# Patient Record
Sex: Female | Born: 1955 | Race: White | Hispanic: No
Health system: Southern US, Community
[De-identification: ages and names within clinical notes are randomized; demographics above are authoritative.]

## PROBLEM LIST (undated history)

## (undated) DIAGNOSIS — E669 Obesity, unspecified: Secondary | ICD-10-CM

## (undated) DIAGNOSIS — E785 Hyperlipidemia, unspecified: Secondary | ICD-10-CM

## (undated) DIAGNOSIS — C541 Malignant neoplasm of endometrium: Secondary | ICD-10-CM

## (undated) DIAGNOSIS — K529 Noninfective gastroenteritis and colitis, unspecified: Secondary | ICD-10-CM

## (undated) DIAGNOSIS — K449 Diaphragmatic hernia without obstruction or gangrene: Secondary | ICD-10-CM

## (undated) DIAGNOSIS — K119 Disease of salivary gland, unspecified: Secondary | ICD-10-CM

## (undated) DIAGNOSIS — G0490 Encephalitis and encephalomyelitis, unspecified: Secondary | ICD-10-CM

## (undated) DIAGNOSIS — C4490 Unspecified malignant neoplasm of skin, unspecified: Secondary | ICD-10-CM

## (undated) DIAGNOSIS — K635 Polyp of colon: Secondary | ICD-10-CM

## (undated) DIAGNOSIS — R002 Palpitations: Secondary | ICD-10-CM

## (undated) DIAGNOSIS — IMO0001 Reserved for inherently not codable concepts without codable children: Secondary | ICD-10-CM

## (undated) DIAGNOSIS — R918 Other nonspecific abnormal finding of lung field: Secondary | ICD-10-CM

## (undated) DIAGNOSIS — B250 Cytomegaloviral pneumonitis: Secondary | ICD-10-CM

## (undated) DIAGNOSIS — J45909 Unspecified asthma, uncomplicated: Secondary | ICD-10-CM

## (undated) DIAGNOSIS — E041 Nontoxic single thyroid nodule: Secondary | ICD-10-CM

## (undated) DIAGNOSIS — D689 Coagulation defect, unspecified: Secondary | ICD-10-CM

## (undated) DIAGNOSIS — H8110 Benign paroxysmal vertigo, unspecified ear: Secondary | ICD-10-CM

## (undated) DIAGNOSIS — I809 Phlebitis and thrombophlebitis of unspecified site: Secondary | ICD-10-CM

## (undated) DIAGNOSIS — K5790 Diverticulosis of intestine, part unspecified, without perforation or abscess without bleeding: Secondary | ICD-10-CM

## (undated) DIAGNOSIS — R609 Edema, unspecified: Secondary | ICD-10-CM

## (undated) HISTORY — DX: Palpitations: R00.2

## (undated) HISTORY — DX: Diverticulosis of intestine, part unspecified, without perforation or abscess without bleeding: K57.90

## (undated) HISTORY — DX: Encephalitis and encephalomyelitis, unspecified: G04.90

## (undated) HISTORY — DX: Obesity, unspecified: E66.9

## (undated) HISTORY — PX: TONSILLECTOMY: SUR1361

## (undated) HISTORY — DX: Unspecified malignant neoplasm of skin, unspecified: C44.90

## (undated) HISTORY — PX: LUNG LOBECTOMY: SHX167

## (undated) HISTORY — DX: Polyp of colon: K63.5

## (undated) HISTORY — PX: LAPAROSCOPY: SHX197

## (undated) HISTORY — DX: Malignant neoplasm of endometrium: C54.1

## (undated) HISTORY — PX: CHOLECYSTECTOMY: SHX55

## (undated) HISTORY — DX: Benign paroxysmal vertigo, unspecified ear: H81.10

## (undated) HISTORY — PX: OTHER SURGICAL HISTORY: SHX169

## (undated) HISTORY — DX: Noninfective gastroenteritis and colitis, unspecified: K52.9

## (undated) HISTORY — DX: Disease of salivary gland, unspecified: K11.9

## (undated) HISTORY — DX: Coagulation defect, unspecified: D68.9

## (undated) HISTORY — DX: Hyperlipidemia, unspecified: E78.5

## (undated) HISTORY — DX: Diaphragmatic hernia without obstruction or gangrene: K44.9

---

## 1997-03-02 ENCOUNTER — Other Ambulatory Visit: Admission: RE | Admit: 1997-03-02 | Discharge: 1997-03-02 | Payer: Self-pay | Admitting: Gynecology

## 1998-03-10 ENCOUNTER — Other Ambulatory Visit: Admission: RE | Admit: 1998-03-10 | Discharge: 1998-03-10 | Payer: Self-pay | Admitting: Gynecology

## 1998-11-30 ENCOUNTER — Encounter: Admission: RE | Admit: 1998-11-30 | Discharge: 1998-11-30 | Payer: Self-pay | Admitting: Internal Medicine

## 1998-11-30 ENCOUNTER — Encounter: Payer: Self-pay | Admitting: Internal Medicine

## 1998-12-13 ENCOUNTER — Other Ambulatory Visit: Admission: RE | Admit: 1998-12-13 | Discharge: 1998-12-13 | Payer: Self-pay | Admitting: Gynecology

## 1999-04-21 ENCOUNTER — Encounter: Payer: Self-pay | Admitting: Orthopedic Surgery

## 1999-04-21 ENCOUNTER — Encounter: Admission: RE | Admit: 1999-04-21 | Discharge: 1999-04-21 | Payer: Self-pay | Admitting: Orthopedic Surgery

## 1999-06-29 ENCOUNTER — Other Ambulatory Visit: Admission: RE | Admit: 1999-06-29 | Discharge: 1999-06-29 | Payer: Self-pay | Admitting: Gynecology

## 1999-12-02 ENCOUNTER — Ambulatory Visit: Admission: RE | Admit: 1999-12-02 | Discharge: 1999-12-02 | Payer: Self-pay | Admitting: Endocrinology

## 1999-12-02 ENCOUNTER — Encounter: Payer: Self-pay | Admitting: Endocrinology

## 2000-10-08 ENCOUNTER — Other Ambulatory Visit: Admission: RE | Admit: 2000-10-08 | Discharge: 2000-10-08 | Payer: Self-pay | Admitting: Gynecology

## 2000-10-10 ENCOUNTER — Encounter: Payer: Self-pay | Admitting: Internal Medicine

## 2000-10-10 ENCOUNTER — Encounter: Admission: RE | Admit: 2000-10-10 | Discharge: 2000-10-10 | Payer: Self-pay | Admitting: Internal Medicine

## 2003-08-11 ENCOUNTER — Encounter: Admission: RE | Admit: 2003-08-11 | Discharge: 2003-08-11 | Payer: Self-pay | Admitting: Gynecology

## 2003-09-01 ENCOUNTER — Other Ambulatory Visit: Admission: RE | Admit: 2003-09-01 | Discharge: 2003-09-01 | Payer: Self-pay | Admitting: Gynecology

## 2003-12-29 ENCOUNTER — Encounter (INDEPENDENT_AMBULATORY_CARE_PROVIDER_SITE_OTHER): Payer: Self-pay | Admitting: *Deleted

## 2003-12-29 ENCOUNTER — Ambulatory Visit: Admission: RE | Admit: 2003-12-29 | Discharge: 2003-12-29 | Payer: Self-pay | Admitting: Gynecology

## 2003-12-30 ENCOUNTER — Ambulatory Visit: Payer: Self-pay | Admitting: Internal Medicine

## 2004-01-07 ENCOUNTER — Ambulatory Visit: Payer: Self-pay | Admitting: Internal Medicine

## 2004-01-08 ENCOUNTER — Ambulatory Visit: Payer: Self-pay | Admitting: Internal Medicine

## 2004-01-10 DIAGNOSIS — C541 Malignant neoplasm of endometrium: Secondary | ICD-10-CM

## 2004-01-10 HISTORY — PX: TOTAL ABDOMINAL HYSTERECTOMY W/ BILATERAL SALPINGOOPHORECTOMY: SHX83

## 2004-01-10 HISTORY — DX: Malignant neoplasm of endometrium: C54.1

## 2004-01-14 ENCOUNTER — Ambulatory Visit: Payer: Self-pay | Admitting: Internal Medicine

## 2004-01-26 ENCOUNTER — Ambulatory Visit: Payer: Self-pay | Admitting: Internal Medicine

## 2004-01-26 HISTORY — PX: COLONOSCOPY W/ POLYPECTOMY: SHX1380

## 2004-02-09 ENCOUNTER — Encounter (INDEPENDENT_AMBULATORY_CARE_PROVIDER_SITE_OTHER): Payer: Self-pay | Admitting: *Deleted

## 2004-02-09 ENCOUNTER — Inpatient Hospital Stay (HOSPITAL_COMMUNITY): Admission: RE | Admit: 2004-02-09 | Discharge: 2004-02-10 | Payer: Self-pay | Admitting: Gynecology

## 2004-03-29 ENCOUNTER — Ambulatory Visit: Admission: RE | Admit: 2004-03-29 | Discharge: 2004-03-29 | Payer: Self-pay | Admitting: Gynecology

## 2004-06-10 ENCOUNTER — Ambulatory Visit: Payer: Self-pay | Admitting: Internal Medicine

## 2004-08-09 DIAGNOSIS — K529 Noninfective gastroenteritis and colitis, unspecified: Secondary | ICD-10-CM

## 2004-08-09 HISTORY — DX: Noninfective gastroenteritis and colitis, unspecified: K52.9

## 2004-08-24 ENCOUNTER — Ambulatory Visit: Payer: Self-pay | Admitting: Internal Medicine

## 2004-08-24 ENCOUNTER — Inpatient Hospital Stay (HOSPITAL_COMMUNITY): Admission: AD | Admit: 2004-08-24 | Discharge: 2004-08-27 | Payer: Self-pay | Admitting: Internal Medicine

## 2004-08-25 ENCOUNTER — Ambulatory Visit: Payer: Self-pay | Admitting: Internal Medicine

## 2004-08-26 ENCOUNTER — Encounter (INDEPENDENT_AMBULATORY_CARE_PROVIDER_SITE_OTHER): Payer: Self-pay | Admitting: Specialist

## 2004-08-26 ENCOUNTER — Ambulatory Visit: Payer: Self-pay | Admitting: Internal Medicine

## 2004-08-26 ENCOUNTER — Encounter: Payer: Self-pay | Admitting: Gastroenterology

## 2004-08-26 ENCOUNTER — Encounter (INDEPENDENT_AMBULATORY_CARE_PROVIDER_SITE_OTHER): Payer: Self-pay | Admitting: *Deleted

## 2004-08-27 ENCOUNTER — Encounter (INDEPENDENT_AMBULATORY_CARE_PROVIDER_SITE_OTHER): Payer: Self-pay | Admitting: *Deleted

## 2004-09-05 ENCOUNTER — Ambulatory Visit: Payer: Self-pay | Admitting: Internal Medicine

## 2004-09-07 ENCOUNTER — Ambulatory Visit: Payer: Self-pay | Admitting: Hematology & Oncology

## 2004-09-13 ENCOUNTER — Ambulatory Visit: Payer: Self-pay | Admitting: Internal Medicine

## 2004-10-28 ENCOUNTER — Ambulatory Visit: Payer: Self-pay | Admitting: Internal Medicine

## 2004-11-16 ENCOUNTER — Ambulatory Visit: Payer: Self-pay | Admitting: Hematology & Oncology

## 2005-01-17 ENCOUNTER — Ambulatory Visit: Payer: Self-pay | Admitting: Internal Medicine

## 2005-02-02 ENCOUNTER — Ambulatory Visit (HOSPITAL_COMMUNITY): Admission: RE | Admit: 2005-02-02 | Discharge: 2005-02-02 | Payer: Self-pay | Admitting: Internal Medicine

## 2005-02-02 ENCOUNTER — Encounter (INDEPENDENT_AMBULATORY_CARE_PROVIDER_SITE_OTHER): Payer: Self-pay | Admitting: *Deleted

## 2005-02-17 ENCOUNTER — Ambulatory Visit: Admission: RE | Admit: 2005-02-17 | Discharge: 2005-02-17 | Payer: Self-pay | Admitting: Gynecology

## 2005-02-17 ENCOUNTER — Encounter (INDEPENDENT_AMBULATORY_CARE_PROVIDER_SITE_OTHER): Payer: Self-pay | Admitting: Specialist

## 2005-02-17 ENCOUNTER — Other Ambulatory Visit: Admission: RE | Admit: 2005-02-17 | Discharge: 2005-02-17 | Payer: Self-pay | Admitting: Gynecology

## 2005-04-15 ENCOUNTER — Ambulatory Visit: Payer: Self-pay | Admitting: Family Medicine

## 2005-05-15 ENCOUNTER — Ambulatory Visit: Payer: Self-pay | Admitting: Internal Medicine

## 2005-06-29 ENCOUNTER — Ambulatory Visit: Payer: Self-pay | Admitting: Family Medicine

## 2005-09-15 ENCOUNTER — Ambulatory Visit: Payer: Self-pay | Admitting: Internal Medicine

## 2005-09-22 ENCOUNTER — Encounter (INDEPENDENT_AMBULATORY_CARE_PROVIDER_SITE_OTHER): Payer: Self-pay | Admitting: *Deleted

## 2005-09-22 ENCOUNTER — Other Ambulatory Visit: Admission: RE | Admit: 2005-09-22 | Discharge: 2005-09-22 | Payer: Self-pay | Admitting: Gynecology

## 2005-09-22 ENCOUNTER — Ambulatory Visit: Admission: RE | Admit: 2005-09-22 | Discharge: 2005-09-22 | Payer: Self-pay | Admitting: Gynecology

## 2005-12-18 ENCOUNTER — Ambulatory Visit: Payer: Self-pay | Admitting: Internal Medicine

## 2006-02-27 ENCOUNTER — Ambulatory Visit: Admission: RE | Admit: 2006-02-27 | Discharge: 2006-02-27 | Payer: Self-pay | Admitting: Gynecology

## 2006-02-27 ENCOUNTER — Encounter (INDEPENDENT_AMBULATORY_CARE_PROVIDER_SITE_OTHER): Payer: Self-pay | Admitting: Specialist

## 2006-02-27 ENCOUNTER — Other Ambulatory Visit: Admission: RE | Admit: 2006-02-27 | Discharge: 2006-02-27 | Payer: Self-pay | Admitting: Gynecology

## 2006-03-08 ENCOUNTER — Ambulatory Visit: Payer: Self-pay | Admitting: Internal Medicine

## 2006-03-22 ENCOUNTER — Encounter: Admission: RE | Admit: 2006-03-22 | Discharge: 2006-03-22 | Payer: Self-pay | Admitting: Internal Medicine

## 2006-03-27 ENCOUNTER — Ambulatory Visit: Payer: Self-pay | Admitting: Internal Medicine

## 2006-04-03 ENCOUNTER — Ambulatory Visit: Payer: Self-pay | Admitting: Internal Medicine

## 2006-04-10 ENCOUNTER — Encounter: Admission: RE | Admit: 2006-04-10 | Discharge: 2006-04-10 | Payer: Self-pay | Admitting: Internal Medicine

## 2006-07-03 ENCOUNTER — Ambulatory Visit: Payer: Self-pay | Admitting: Internal Medicine

## 2006-07-03 LAB — CONVERTED CEMR LAB
Calcium: 9.2 mg/dL (ref 8.4–10.5)
Chloride: 105 meq/L (ref 96–112)
GFR calc non Af Amer: 62 mL/min
Glucose, Bld: 87 mg/dL (ref 70–99)
HCT: 41.3 % (ref 36.0–46.0)
Hemoglobin: 13.9 g/dL (ref 12.0–15.0)
MCHC: 33.6 g/dL (ref 30.0–36.0)
Magnesium: 2.3 mg/dL (ref 1.5–2.5)
Monocytes Absolute: 0.4 10*3/uL (ref 0.2–0.7)
Monocytes Relative: 6.6 % (ref 3.0–11.0)
Neutrophils Relative %: 67.5 % (ref 43.0–77.0)
Platelets: 226 10*3/uL (ref 150–400)
T3, Free: 2.8 pg/mL (ref 2.3–4.2)
TSH: 3.13 microintl units/mL (ref 0.35–5.50)

## 2006-07-04 ENCOUNTER — Encounter (INDEPENDENT_AMBULATORY_CARE_PROVIDER_SITE_OTHER): Payer: Self-pay | Admitting: *Deleted

## 2006-11-01 ENCOUNTER — Ambulatory Visit: Payer: Self-pay | Admitting: Family Medicine

## 2007-02-23 ENCOUNTER — Ambulatory Visit: Payer: Self-pay | Admitting: Family Medicine

## 2007-02-23 DIAGNOSIS — J3089 Other allergic rhinitis: Secondary | ICD-10-CM | POA: Insufficient documentation

## 2007-06-22 ENCOUNTER — Ambulatory Visit: Payer: Self-pay | Admitting: Family Medicine

## 2007-06-22 DIAGNOSIS — K119 Disease of salivary gland, unspecified: Secondary | ICD-10-CM | POA: Insufficient documentation

## 2007-06-22 DIAGNOSIS — J309 Allergic rhinitis, unspecified: Secondary | ICD-10-CM | POA: Insufficient documentation

## 2007-06-22 DIAGNOSIS — E785 Hyperlipidemia, unspecified: Secondary | ICD-10-CM

## 2007-06-22 HISTORY — DX: Hyperlipidemia, unspecified: E78.5

## 2007-06-22 HISTORY — DX: Disease of salivary gland, unspecified: K11.9

## 2008-02-03 ENCOUNTER — Emergency Department (HOSPITAL_COMMUNITY): Admission: EM | Admit: 2008-02-03 | Discharge: 2008-02-04 | Payer: Self-pay | Admitting: Emergency Medicine

## 2008-02-04 ENCOUNTER — Encounter: Payer: Self-pay | Admitting: Internal Medicine

## 2008-02-05 ENCOUNTER — Encounter: Admission: RE | Admit: 2008-02-05 | Discharge: 2008-02-05 | Payer: Self-pay | Admitting: Orthopedic Surgery

## 2008-06-05 ENCOUNTER — Other Ambulatory Visit: Admission: RE | Admit: 2008-06-05 | Discharge: 2008-06-05 | Payer: Self-pay | Admitting: Gynecology

## 2008-06-05 ENCOUNTER — Ambulatory Visit: Admission: RE | Admit: 2008-06-05 | Discharge: 2008-06-05 | Payer: Self-pay | Admitting: Gynecology

## 2008-07-28 ENCOUNTER — Ambulatory Visit: Payer: Self-pay | Admitting: Internal Medicine

## 2008-07-30 ENCOUNTER — Ambulatory Visit: Payer: Self-pay | Admitting: Internal Medicine

## 2008-09-16 ENCOUNTER — Ambulatory Visit: Payer: Self-pay | Admitting: Internal Medicine

## 2008-09-16 DIAGNOSIS — N959 Unspecified menopausal and perimenopausal disorder: Secondary | ICD-10-CM | POA: Insufficient documentation

## 2008-09-21 ENCOUNTER — Ambulatory Visit: Payer: Self-pay | Admitting: Internal Medicine

## 2008-09-21 DIAGNOSIS — R609 Edema, unspecified: Secondary | ICD-10-CM | POA: Insufficient documentation

## 2008-09-28 ENCOUNTER — Encounter: Payer: Self-pay | Admitting: Internal Medicine

## 2008-09-28 ENCOUNTER — Encounter: Admission: RE | Admit: 2008-09-28 | Discharge: 2008-09-28 | Payer: Self-pay | Admitting: Internal Medicine

## 2008-09-30 ENCOUNTER — Encounter: Admission: RE | Admit: 2008-09-30 | Discharge: 2008-09-30 | Payer: Self-pay | Admitting: Internal Medicine

## 2008-10-02 ENCOUNTER — Encounter (INDEPENDENT_AMBULATORY_CARE_PROVIDER_SITE_OTHER): Payer: Self-pay | Admitting: *Deleted

## 2008-10-29 ENCOUNTER — Ambulatory Visit: Payer: Self-pay | Admitting: Family Medicine

## 2008-10-31 ENCOUNTER — Ambulatory Visit: Payer: Self-pay | Admitting: Internal Medicine

## 2008-11-04 ENCOUNTER — Telehealth (INDEPENDENT_AMBULATORY_CARE_PROVIDER_SITE_OTHER): Payer: Self-pay | Admitting: *Deleted

## 2008-11-06 ENCOUNTER — Ambulatory Visit: Payer: Self-pay | Admitting: Internal Medicine

## 2008-12-18 ENCOUNTER — Ambulatory Visit: Admission: RE | Admit: 2008-12-18 | Discharge: 2008-12-18 | Payer: Self-pay | Admitting: Gynecology

## 2008-12-18 ENCOUNTER — Other Ambulatory Visit: Admission: RE | Admit: 2008-12-18 | Discharge: 2008-12-18 | Payer: Self-pay | Admitting: Gynecology

## 2009-03-08 ENCOUNTER — Encounter: Admission: RE | Admit: 2009-03-08 | Discharge: 2009-03-08 | Payer: Self-pay | Admitting: Internal Medicine

## 2009-03-08 ENCOUNTER — Ambulatory Visit: Payer: Self-pay | Admitting: Internal Medicine

## 2009-03-08 ENCOUNTER — Telehealth: Payer: Self-pay | Admitting: Internal Medicine

## 2009-03-08 DIAGNOSIS — D689 Coagulation defect, unspecified: Secondary | ICD-10-CM | POA: Insufficient documentation

## 2009-03-08 DIAGNOSIS — R0602 Shortness of breath: Secondary | ICD-10-CM | POA: Insufficient documentation

## 2009-03-08 DIAGNOSIS — M79609 Pain in unspecified limb: Secondary | ICD-10-CM | POA: Insufficient documentation

## 2009-03-09 ENCOUNTER — Ambulatory Visit: Payer: Self-pay | Admitting: Cardiovascular Disease

## 2009-03-09 ENCOUNTER — Telehealth: Payer: Self-pay | Admitting: Internal Medicine

## 2009-03-09 ENCOUNTER — Ambulatory Visit: Payer: Self-pay | Admitting: Internal Medicine

## 2009-03-09 LAB — CONVERTED CEMR LAB: Creatinine, Ser: 1 mg/dL (ref 0.4–1.2)

## 2009-03-15 LAB — CONVERTED CEMR LAB
AntiThromb III Func: 92 % (ref 76–126)
Anticardiolipin IgG: 7 (ref <10)
Protein C Activity: 173 % — ABNORMAL HIGH (ref 75–133)

## 2009-04-09 ENCOUNTER — Ambulatory Visit: Payer: Self-pay | Admitting: Family Medicine

## 2009-04-09 ENCOUNTER — Telehealth: Payer: Self-pay | Admitting: Family Medicine

## 2009-04-09 ENCOUNTER — Ambulatory Visit: Payer: Self-pay | Admitting: Internal Medicine

## 2009-04-09 DIAGNOSIS — R1032 Left lower quadrant pain: Secondary | ICD-10-CM | POA: Insufficient documentation

## 2009-04-09 LAB — CONVERTED CEMR LAB
Protein, U semiquant: NEGATIVE
Specific Gravity, Urine: 1.015
Urobilinogen, UA: 0.2
WBC Urine, dipstick: NEGATIVE

## 2009-04-12 LAB — CONVERTED CEMR LAB
AST: 23 units/L (ref 0–37)
Alkaline Phosphatase: 99 units/L (ref 39–117)
Amylase: 44 units/L (ref 27–131)
Calcium: 9 mg/dL (ref 8.4–10.5)
Chloride: 104 meq/L (ref 96–112)
Eosinophils Absolute: 0 10*3/uL (ref 0.0–0.7)
Eosinophils Relative: 0.1 % (ref 0.0–5.0)
GFR calc non Af Amer: 54.66 mL/min (ref >60)
HCT: 41.8 % (ref 36.0–46.0)
Hemoglobin: 14.5 g/dL (ref 12.0–15.0)
Monocytes Absolute: 0.5 10*3/uL (ref 0.1–1.0)
Neutrophils Relative %: 64.6 % (ref 43.0–77.0)
Platelets: 231 10*3/uL (ref 150.0–400.0)
RBC: 4.56 M/uL (ref 3.87–5.11)
Total Bilirubin: 0.6 mg/dL (ref 0.3–1.2)

## 2009-04-16 ENCOUNTER — Telehealth: Payer: Self-pay | Admitting: Family Medicine

## 2009-04-16 ENCOUNTER — Ambulatory Visit: Payer: Self-pay | Admitting: Family Medicine

## 2009-04-16 LAB — CONVERTED CEMR LAB
Chloride: 104 meq/L (ref 96–112)
Creatinine, Ser: 1.1 mg/dL (ref 0.4–1.2)
GFR calc non Af Amer: 54.65 mL/min (ref >60)
Potassium: 4.5 meq/L (ref 3.5–5.1)
Sodium: 140 meq/L (ref 135–145)

## 2009-04-17 ENCOUNTER — Encounter: Payer: Self-pay | Admitting: Family Medicine

## 2009-04-17 LAB — CONVERTED CEMR LAB
Bilirubin Urine: NEGATIVE
Crystals: NONE SEEN
Hemoglobin, Urine: NEGATIVE
Ketones, ur: NEGATIVE mg/dL
Specific Gravity, Urine: 1.024 (ref 1.005–1.030)
Urine Glucose: NEGATIVE mg/dL
Urobilinogen, UA: 0.2 (ref 0.0–1.0)

## 2009-04-19 ENCOUNTER — Ambulatory Visit: Payer: Self-pay | Admitting: Internal Medicine

## 2009-04-19 DIAGNOSIS — K5732 Diverticulitis of large intestine without perforation or abscess without bleeding: Secondary | ICD-10-CM | POA: Insufficient documentation

## 2009-04-19 DIAGNOSIS — Z860100 Personal history of colon polyps, unspecified: Secondary | ICD-10-CM | POA: Insufficient documentation

## 2009-05-26 ENCOUNTER — Ambulatory Visit: Payer: Self-pay | Admitting: Family Medicine

## 2009-05-26 ENCOUNTER — Encounter: Payer: Self-pay | Admitting: Internal Medicine

## 2009-05-26 DIAGNOSIS — N644 Mastodynia: Secondary | ICD-10-CM | POA: Insufficient documentation

## 2009-05-26 DIAGNOSIS — K449 Diaphragmatic hernia without obstruction or gangrene: Secondary | ICD-10-CM

## 2009-05-26 HISTORY — DX: Diaphragmatic hernia without obstruction or gangrene: K44.9

## 2009-05-27 ENCOUNTER — Telehealth (INDEPENDENT_AMBULATORY_CARE_PROVIDER_SITE_OTHER): Payer: Self-pay | Admitting: *Deleted

## 2009-05-27 ENCOUNTER — Encounter: Admission: RE | Admit: 2009-05-27 | Discharge: 2009-05-27 | Payer: Self-pay | Admitting: Family Medicine

## 2009-05-27 LAB — HM MAMMOGRAPHY: HM Mammogram: NEGATIVE

## 2009-07-08 ENCOUNTER — Encounter: Payer: Self-pay | Admitting: Internal Medicine

## 2009-07-22 ENCOUNTER — Ambulatory Visit: Payer: Self-pay | Admitting: Internal Medicine

## 2009-07-22 DIAGNOSIS — K117 Disturbances of salivary secretion: Secondary | ICD-10-CM | POA: Insufficient documentation

## 2009-07-22 DIAGNOSIS — R0609 Other forms of dyspnea: Secondary | ICD-10-CM | POA: Insufficient documentation

## 2009-07-22 DIAGNOSIS — H04129 Dry eye syndrome of unspecified lacrimal gland: Secondary | ICD-10-CM | POA: Insufficient documentation

## 2009-07-22 DIAGNOSIS — R0989 Other specified symptoms and signs involving the circulatory and respiratory systems: Secondary | ICD-10-CM

## 2009-07-22 DIAGNOSIS — L851 Acquired keratosis [keratoderma] palmaris et plantaris: Secondary | ICD-10-CM | POA: Insufficient documentation

## 2009-07-22 DIAGNOSIS — M25569 Pain in unspecified knee: Secondary | ICD-10-CM | POA: Insufficient documentation

## 2009-07-22 DIAGNOSIS — R002 Palpitations: Secondary | ICD-10-CM | POA: Insufficient documentation

## 2009-07-26 ENCOUNTER — Encounter: Payer: Self-pay | Admitting: Internal Medicine

## 2009-07-26 LAB — CONVERTED CEMR LAB
BUN: 14 mg/dL (ref 6–23)
Calcium: 9.2 mg/dL (ref 8.4–10.5)
Chloride: 108 meq/L (ref 96–112)
Creatinine, Ser: 0.9 mg/dL (ref 0.4–1.2)
Eosinophils Absolute: 0 10*3/uL (ref 0.0–0.7)
Eosinophils Relative: 0 % (ref 0.0–5.0)
Glucose, Bld: 89 mg/dL (ref 70–99)
Lymphocytes Relative: 43.2 % (ref 12.0–46.0)
MCHC: 34.7 g/dL (ref 30.0–36.0)
Magnesium: 2.3 mg/dL (ref 1.5–2.5)
Monocytes Absolute: 0.6 10*3/uL (ref 0.1–1.0)
Neutro Abs: 2.5 10*3/uL (ref 1.4–7.7)
Neutrophils Relative %: 45.6 % (ref 43.0–77.0)
Platelets: 209 10*3/uL (ref 150.0–400.0)
Potassium: 4.3 meq/L (ref 3.5–5.1)
RBC: 4.37 M/uL (ref 3.87–5.11)
RDW: 13.8 % (ref 11.5–14.6)

## 2009-10-07 ENCOUNTER — Ambulatory Visit: Payer: Self-pay | Admitting: Cardiology

## 2009-10-07 DIAGNOSIS — R079 Chest pain, unspecified: Secondary | ICD-10-CM | POA: Insufficient documentation

## 2009-10-08 ENCOUNTER — Emergency Department (HOSPITAL_COMMUNITY): Admission: EM | Admit: 2009-10-08 | Discharge: 2009-10-08 | Payer: Self-pay | Admitting: Emergency Medicine

## 2009-10-08 ENCOUNTER — Telehealth: Payer: Self-pay | Admitting: Cardiology

## 2009-10-11 ENCOUNTER — Encounter: Payer: Self-pay | Admitting: Internal Medicine

## 2009-10-11 ENCOUNTER — Ambulatory Visit: Payer: Self-pay | Admitting: Cardiology

## 2009-10-13 ENCOUNTER — Telehealth (INDEPENDENT_AMBULATORY_CARE_PROVIDER_SITE_OTHER): Payer: Self-pay

## 2009-10-14 ENCOUNTER — Ambulatory Visit (HOSPITAL_COMMUNITY): Admission: RE | Admit: 2009-10-14 | Discharge: 2009-10-14 | Payer: Self-pay | Admitting: Cardiology

## 2009-10-14 ENCOUNTER — Ambulatory Visit: Payer: Self-pay | Admitting: Cardiovascular Disease

## 2009-10-14 ENCOUNTER — Encounter: Payer: Self-pay | Admitting: Cardiology

## 2009-10-14 ENCOUNTER — Ambulatory Visit: Payer: Self-pay

## 2009-10-18 LAB — CONVERTED CEMR LAB
Albumin: 3.7 g/dL (ref 3.5–5.2)
Alkaline Phosphatase: 97 units/L (ref 39–117)
BUN: 13 mg/dL (ref 6–23)
CO2: 29 meq/L (ref 19–32)
Chloride: 104 meq/L (ref 96–112)
Direct LDL: 157.8 mg/dL
Glucose, Bld: 99 mg/dL (ref 70–99)
Potassium: 4.8 meq/L (ref 3.5–5.1)
Sodium: 140 meq/L (ref 135–145)
Total CHOL/HDL Ratio: 4
Total Protein: 6.6 g/dL (ref 6.0–8.3)
Triglycerides: 101 mg/dL (ref 0.0–149.0)
VLDL: 20.2 mg/dL (ref 0.0–40.0)

## 2009-10-19 ENCOUNTER — Telehealth (INDEPENDENT_AMBULATORY_CARE_PROVIDER_SITE_OTHER): Payer: Self-pay | Admitting: *Deleted

## 2009-10-22 ENCOUNTER — Ambulatory Visit: Payer: Self-pay | Admitting: Cardiology

## 2009-11-01 ENCOUNTER — Ambulatory Visit: Payer: Self-pay | Admitting: Cardiology

## 2009-11-24 ENCOUNTER — Telehealth: Payer: Self-pay | Admitting: Cardiology

## 2009-11-24 ENCOUNTER — Telehealth (INDEPENDENT_AMBULATORY_CARE_PROVIDER_SITE_OTHER): Payer: Self-pay | Admitting: *Deleted

## 2009-12-27 ENCOUNTER — Telehealth: Payer: Self-pay | Admitting: Internal Medicine

## 2009-12-29 ENCOUNTER — Ambulatory Visit: Payer: Self-pay | Admitting: Internal Medicine

## 2009-12-30 ENCOUNTER — Telehealth: Payer: Self-pay | Admitting: Internal Medicine

## 2010-01-06 ENCOUNTER — Ambulatory Visit: Payer: Self-pay | Admitting: Internal Medicine

## 2010-01-17 DIAGNOSIS — D126 Benign neoplasm of colon, unspecified: Secondary | ICD-10-CM | POA: Insufficient documentation

## 2010-01-17 LAB — CONVERTED CEMR LAB: HDL goal, serum: 40 mg/dL

## 2010-01-18 ENCOUNTER — Encounter: Payer: Self-pay | Admitting: Internal Medicine

## 2010-01-18 ENCOUNTER — Telehealth: Payer: Self-pay | Admitting: Internal Medicine

## 2010-01-19 ENCOUNTER — Telehealth: Payer: Self-pay | Admitting: Internal Medicine

## 2010-01-19 ENCOUNTER — Ambulatory Visit
Admission: RE | Admit: 2010-01-19 | Discharge: 2010-01-19 | Payer: Self-pay | Source: Home / Self Care | Attending: Internal Medicine | Admitting: Internal Medicine

## 2010-01-19 ENCOUNTER — Encounter: Payer: Self-pay | Admitting: Internal Medicine

## 2010-01-19 DIAGNOSIS — K625 Hemorrhage of anus and rectum: Secondary | ICD-10-CM | POA: Insufficient documentation

## 2010-01-27 ENCOUNTER — Encounter: Payer: Self-pay | Admitting: Internal Medicine

## 2010-01-27 ENCOUNTER — Ambulatory Visit
Admission: RE | Admit: 2010-01-27 | Discharge: 2010-01-27 | Payer: Self-pay | Source: Home / Self Care | Attending: Internal Medicine | Admitting: Internal Medicine

## 2010-01-30 ENCOUNTER — Encounter: Payer: Self-pay | Admitting: Internal Medicine

## 2010-02-08 NOTE — Assessment & Plan Note (Signed)
Summary: PVC'S ON EKG ON 5/18 VISIT--FOLLOWUP///SPH   Vital Signs:  Patient profile:   55 year old female Weight:      283.6 pounds Temp:     98.5 degrees F oral Pulse rate:   64 / minute Resp:     15 per minute BP sitting:   122 / 74  (left arm) Cuff size:   large  Vitals Entered By: Shonna Chock CMA (July 22, 2009 2:50 PM) CC: follow-up visit, shortness of breath, Palpitations Comments REVIEWED MED LIST, PATIENT AGREED DOSE AND INSTRUCTION CORRECT    Primary Care Provider:  [facility]  CC:  follow-up visit, shortness of breath, and Palpitations.  History of Present Illness: She has episodic "feelings of breathlessness"; EKG done  05/26/2009 revealed  SA due to  premature supraventricular beats.  There is no history of asthma, valvular heart disease, renal failure,  or  thyroid disease.  The patient notes no precipitating events except exertion occasionally.  Patient notes dyspnea with walking on a flat surface < 1/2 block  but not every time. The patient also presents with Palpitations.  The patient reports  occasional  chest pain and throat tightness, but denies dizziness, presyncope, and syncope.  She has had chronic  heat intolerance.  The patient denies the following symptoms: blurred vision, numbness (except @ computer), weakness, diaphoresis, and nausea.  The palpitations are described as a sensation of the heart beating strongly & irregularly.  The palpitations are sudden in onset, intermittent, occur weekly, occur during the day, and occur at night. The palpitations & breathlessness are 2 separate issues which may occur together.   Allergies: 1)  ! Vioxx  Review of Systems General:  Complains of fatigue; denies sleep disorder. Eyes:  Denies blurring, double vision, and vision loss-both eyes; Dry eyes. ENT:  Denies difficulty swallowing and hoarseness; Dry mouth. GI:  Denies constipation and diarrhea. MS:  Complains of joint pain and low back pain; denies joint redness, joint  swelling, mid back pain, and thoracic pain. Derm:  Complains of dryness; denies changes in nail beds, hair loss, lesion(s), and rash. Neuro:  Denies tingling. Endo:  Denies cold intolerance.  Physical Exam  General:  in no acute distress; alert,appropriate and cooperative throughout examination Eyes:  No corneal or conjunctival inflammation noted.  Perrla. No lid lag Mouth:  Oral mucosa and oropharynx without lesions or exudates.  Teeth in good repair. Normal moisture Neck:  No deformities, masses, or tenderness noted. Lungs:  Normal respiratory effort, chest expands symmetrically. Lungs are clear to auscultation, no crackles or wheezes. Heart:  regular rhythm, no murmur, no gallop, no rub, no JVD, no HJR, and bradycardia.   Pulses:  R and L carotid,radial,dorsalis pedis and posterior tibial pulses are full and equal bilaterally Extremities:  No clubbing, cyanosis, or deformity noted with normal full range of motion of all joints. Trace edema. Crepitus L knee > R  Neurologic:  alert & oriented X3 and DTRs symmetrical and normal.   Skin:  Intact without suspicious lesions or rashes but dry Psych:  memory intact for recent and remote, normally interactive, good eye contact, and not anxious appearing.     Impression & Recommendations:  Problem # 1:  DRY MOUTH (ICD-527.7)  Problem # 2:  PALPITATIONS (ICD-785.1)  Orders: Venipuncture (81191) TLB-BMP (Basic Metabolic Panel-BMET) (80048-METABOL) TLB-TSH (Thyroid Stimulating Hormone) (84443-TSH) TLB-Magnesium (Mg) (83735-MG) TLB-T4 (Thyrox), Free [phone])  Problem # 3:  DRY SKIN (ICD-701.1)  Orders: TLB-TSH (Thyroid Stimulating Hormone) (84443-TSH)  Problem # 4:  DRY EYE SYNDROME (ICD-375.15)  Problem # 5:  DRY MOUTH (ICD-527.7)  Complete Medication List: 1)  Aspir-low 81 Mg Tbec (Aspirin) 2)  Ultram 50 Mg Tabs (Tramadol hcl) .Marland Kitchen.. 1-2 by mouth every 6 hours as needed  Other Orders: TLB-CBC Platelet - w/Differential  (85025-CBCD) T-2 View CXR (71020TC) TLB-Sedimentation Rate (ESR) (85652-ESR)  Patient Instructions: 1)  See Dr Blima Ledger for dry eyes assessment. Avoid stimulants as discussed as much as possible. Cardiology referral if  indicated.

## 2010-02-08 NOTE — Assessment & Plan Note (Signed)
Summary: np6/DOE/jml      Allergies Added:   Visit Type:  new pt Primary Provider:  Dr. Alwyn Ren  CC:  SOB.  History of Present Illness: 55 yo with history of antiphospholipid antibody syndrome and prior endometrial cancer presents for evaluation of dyspnea and chest pressure.  Patient has had episodes of sudden shortness of breath with no trigger since 2007.  They seem worse recently.  They are not exertional and will come on suddenly and last for up to 2 hours at a time.  They are not associated with palpitations or sensation of racing heart rate.  She also has mild exertional dyspnea (such as climbing a flight of steps).  She also feels like her heart beats irregularly at times, especially at night when she is lying down in bed.  These palpitations seem worse also.  Finally, she gets episodes of chest pressure.  These are not associated with the dyspnea or the palpitations.  These also occur seemingly at random and can last an extended period of time before resolving.  Nonexertional also.  She has been under a lot of stress over the last few years (husband had a liver transplant) but this has not been worse recently.  She does not smoke.  She does have an apparent history of antiphospholipid antibody syndrome that was diagnosed after a bout of possible ischemic colitis.  She is on ASA.  She had a chest X-ray in 7/11 that was unremarkable.    Labs (7/11): HCT 40.6, K 4.3, creatinine 0.6, TSH normal, free T4 normal  ECG: NSR, biatrial enlargement  Current Medications (verified): 1)  Aspir-Low 81 Mg Tbec (Aspirin)  Allergies (verified): 1)  ! Vioxx  Past History:  Past Medical History: 1. Encephalitis with coma age 16 2. CMV pneumonia 1993 3. Colitis 2006, ? ischemic 4. Clotting disorder followed by Dr Myna Hidalgo (sounds like antiphospholipid antibody syndrome given positive anticardiolipin antibody and positve beta-2 glycoprotetin tests.  ASA 81 mg once daily  5. Edema LLE post ACL sprain  01/2008 6. Allergic rhinitis 7. Hyperlipidemia 8. ?RAD  wheezes with RTIs  9. Colonic polyps, hx of 10. Diverticulosis, colon 11. Endometrial cancer s/p hysterectomy 12. 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  Husband had liver transplant.   Review of Systems       All systems reviewed and negative except as per HPI.   Vital Signs:  Patient profile:   55 year old female Height:      67 inches Weight:      280 pounds BMI:     44.01 Pulse rate:   81 / minute BP sitting:   128 / 90  (left arm)  Vitals Entered By: Caralee Ates CMA (October 07, 2009 9:31 AM)  Physical Exam  General:  Well developed, well nourished, in no acute distress. Obese.  Head:  normocephalic and atraumatic Nose:  no deformity, discharge, inflammation, or lesions Mouth:  Teeth, gums and palate normal. Oral mucosa normal. Neck:  Neck supple, no JVD. No masses, thyromegaly or abnormal cervical nodes. Lungs:  Clear bilaterally to auscultation and percussion. Heart:  Non-displaced PMI, chest non-tender; regular rate and rhythm, S1, S2 without murmurs, rubs or gallops. Carotid upstroke normal, no bruit.  Pedals normal pulses. No edema, no varicosities. Abdomen:  Bowel sounds positive; abdomen soft and non-tender without masses, organomegaly, or  hernias noted. No hepatosplenomegaly. Msk:  Back normal, normal gait. Muscle strength and tone normal. Extremities:  No clubbing or cyanosis. Neurologic:  Alert and oriented x 3. Skin:  Intact without lesions or rashes. Psych:  Normal affect.   Impression & Recommendations:  Problem # 1:  CHEST PAIN (ICD-786.50) Atypical chest pressure and episodes of sudden shortness of breath.  These seem to occur separately.  Episodes are not exertional.  She appears euvolemic on examination.  I do not have a good  explanation for these episodes, it is possible that stress is driving them.  Given her family history of premature CAD (mother had MI in her early 84s) and her apparent antiphospholipid antibody syndrome (which can increase risk of MI), will have her get a stress echocardiogram.  She should continue ASA 81 mg daily given the apparent antiphospholipid antibody syndrome.  I will also have her get a BNP, and she is due to get her lipids checked.   Problem # 2:  PALPITATIONS (ICD-785.1) Patient notes an irregular heart rate, especially when she lies down.  This has been getting worse.  I will get a holter monitor to assess for arrhythmias.  Probably premature beats, but she does have biatrial enlargement on ECG and obesity, so atrial fibrillation is a concern.   Other Orders: Stress Echo (Stress Echo) Echocardiogram (Echo) Holter Monitor (Holter Monitor)  Patient Instructions: 1)  Your physician has requested that you have a stress echocardiogram. For further information please visit [email or web address]  Please follow instruction sheet as given. 2)  Your physician has requested that you have an echocardiogram.  Echocardiography is a painless test that uses sound waves to create images of your heart. It provides your doctor with information about the size and shape of your heart and how well your heart's chambers and valves are working.  This procedure takes approximately one hour. There are no restrictions for this procedure. 3)  Your physician has recommended that you wear a holter monitor.  Holter monitors are medical devices that record the heart's electrical activity. Doctors most often use these monitors to diagnose arrhythmias. Arrhythmias are problems with the speed or rhythm of the heartbeat. The monitor is a small, portable device. You can wear one while you do your normal daily activities. This is usually used to diagnose what is causing palpitations/syncope (passing out). 4)  Your physician  recommends that you return for a FASTING lipid profile/liver profile/BMP  786.50 786.05 5)  Your physician recommends that you schedule a follow-up appointment in: 3 weeks with Dr Shirlee Latch.

## 2010-02-08 NOTE — Progress Notes (Signed)
Summary: FYI MORE INFO FROM PATIENT ABOUT 2/28 VISIT   Phone Note Call from Patient   Caller: Patient Summary of Call: PATIENT SAW DR Brylan Dec YESTERDAY MONDAY 03/08/2009  SHE SAYS SHE WENT HOME YESTERDAY AND THOUGHT ABOUT VISIT AND SAYS SHE IS NOT SURE IF SHE GAVE DR Arelis Neumeier A CORRECT TIME LINE OF EVENTS  SHE WROTE DOWN THE EVENTS WITH DATES AND BROUGHT THIS PAPERWORK  IN TODAY SO THAT DR Elinor Kleine CAN REVIEW DATES  WILL PUT PAPERWORK IN PLASTIC SLEEVE AND TAKE TO [facility] Initial call taken by: Jerolyn Shin,  March 09, 2009 12:09 PM  Follow-up for Phone Call        NOTE STATES:2-20 to 2-21"Sunday night/early monday morning approximately 2-3 am episode of difficulty breathing panting and rapid heart beat(heart pounding). After episode heart slowed down and she wasn't panting but still felt as though she couldn't get enough air in her lungs.  And one morning after getting up pt states that she had a pain in her left leg in calf area. pt states that on one evening she was sitting on the couch it felt as though she couldn't get enough air in her lungs which continued for several  hours. Pt states that the pain in leg is still present but it has started to improve over the weekend.................Felecia Deloach CMA  March 09, 2009 2:11 PM   Additional Follow-up for Phone Call Additional follow up Details #1::        CT scan was negative for PTE; Hiatal Hernia present . Please take Ranitidine 150 mg two times a day pre mealsX 1 month. Do not eat within 3 hrs of  bedtime . F/U if symptoms do not resolve completely over 5-7 days Additional Follow-up by: Anesa Fronek MD,  March 10, 2009 9:14 AM    Additional Follow-up for Phone Call Additional follow up Details #2::    pt aware of ct results but would prefer med be sent in as rx so that insurance will cover, pt aware rx sent to pharmacy.............Felecia Deloach CMA  March 10, 2009 9:43 AM   New/Updated Medications: RANITIDINE HCL 150 MG TABS  (RANITIDINE HCL) Take 1 tab two times a day pre mealsX 1 month Prescriptions: RANITIDINE HCL 150 MG TABS (RANITIDINE HCL) Take 1 tab two times a day pre mealsX 1 month  #60 x 0   Entered by:   Felecia Deloach CMA   Authorized by:   Essie Lagunes MD   Signed by:   Felecia Deloach CMA on 03/10/2009   Method used:   [facility] to ...       [facility] [id number]       [address]       [place], [place]  [address]       Ph: [phone] or [phone]       Fax: [phone]   RxID:   [id number]

## 2010-02-08 NOTE — Progress Notes (Signed)
Summary: calling re event she had with her heart   Phone Note Call from Patient   Caller: Patient [phone] Reason for Call: Talk to Nurse Summary of Call: pt calling re "an event" she had last night with her heart, they did blood work and it was fine, however they told her it was to soon to tell if there was any heart damage, and she would have to do blood work later-pt wants to know if she should just wait until something else happens, or until her echo 10-10, or come in prior for labwork Initial call taken by: Glynda Jaeger,  October 08, 2009 11:26 AM  Follow-up for Phone Call        SPOKE WITH PT RE MESSAGE STATED WENT TO [facility] LAST NIGHT  FOR CHEST PAIN EKG NORMAL SINUS RYTHMN, TROPONIN AND  CKMB IN NORM RANGE  INFORMED PT  IF TEST CAME BACK ABN WOULD HAVE ADMITTED PT FOR OBSERVATION PT VERBALIZED UNDERSTANDING  INFORMED PT TO PROCEED WITH TESTING THAT IS SCHEUULED FOR 10/18/09  WILL NOTIFY VIA PHONE TEST RESULTS ONCE REVIEWED.PT AGREES WITH ABOVE . Follow-up by: Scherrie Bateman, LPN,  October 08, 2009 12:24 PM     Appended Document: calling re event she had with her heart See if we can get her in for a stress echo earlier.   Appended Document: calling re event she had with her heart flag sent to [facility] to try to arrange earlier stress echo appt  Appended Document: calling re event she had with her heart stress echo scheduled for 10/14/09

## 2010-02-08 NOTE — Progress Notes (Signed)
Summary: 48 hour holter monitor  Phone Note Outgoing Call Call back at [facility] Phone [phone]   Call placed by: Stanton Kidney, EMT-P,  October 19, 2009 5:39 PM Call placed to: Patient Action Taken: Phone Call Completed, Appt scheduled Summary of Call: 48 hour holter monitor scheduled for 10/22/09 at 11:00. Stanton Kidney, EMT-P  October 19, 2009 5:40 PM

## 2010-02-08 NOTE — Procedures (Signed)
Summary: summary report  summary report   Imported By: Mirna Mires 11/01/2009 16:11:52  _____________________________________________________________________  External Attachment:    Type:   Image     Comment:   External Document

## 2010-02-08 NOTE — Assessment & Plan Note (Signed)
Summary: LEFT LEG HOT TO TOUCH W/PAIN/RH.....   Vital Signs:  Patient profile:   55 year old female Weight:      283 pounds Temp:     98.9 degrees F oral Pulse rate:   66 / minute Resp:     16 per minute BP sitting:   120 / 78  (left arm)  Vitals Entered By: Doristine Devoid (March 08, 2009 12:24 PM) CC: L calf pain x5 days worst when walking    CC:  L calf pain x5 days worst when walking .  History of Present Illness: Upon arising & walking 03/02/2009 she experienced pain L calf, better sitting. No trigger or injury for this. Pain is positional, better flat footed, worse stepping onto ball of foot & with foot extension . PMH of "mild clotting disorder" in ? 2007  treated by Dr Myna Hidalgo with ASA 81 mg once daily . No Rx for calf pain to date  Allergies: 1)  ! Vioxx  Past History:  Past Medical History: encephalitis with coma age 88; CMV pneumonia 45; colitis 2006; clotting disorder followed by Dr Myna Hidalgo, ASA 81 mg once daily  Edema LLE post ACL sprain 01/2008 Allergic rhinitis Hyperlipidemia ?RAD  wheezes with RTIs   Review of Systems CV:  Denies chest pain or discomfort, palpitations, and shortness of breath with exertion. Heme:  Denies abnormal bruising and bleeding.  Physical Exam  General:  in no acute distress; alert,appropriate and cooperative throughout examination Lungs:  Normal respiratory effort, chest expands symmetrically. Lungs are clear to auscultation, no crackles or wheezes. Heart:  Normal rate and regular rhythm. S1 and S2 normal without gallop, murmur, click, rub or other extra sounds. Extremities:  1+ left pedal edema.  + Homan's LLE; neg RLE. No pain with ROM L knee or L ankle . Skin:  Intact without suspicious lesions or rashes; no change in temp or color   Impression & Recommendations:  Problem # 1:  CALF PAIN, LEFT (ICD-729.5)  Orders: Venipuncture ([address]) T- * Misc. Laboratory test [phone]) Radiology Referral (Radiology) T-D-Dimer Fibrin  Derivatives Quantitive [phone])  Problem # 2:  OTHER AND UNSPECIFIED COAGULATION DEFECTS (ICD-286.9)  Orders: Venipuncture ([address]) T- * Misc. Laboratory test [phone]) Radiology Referral (Radiology) T-D-Dimer Fibrin Derivatives Quantitive [phone])  Patient Instructions: 1)  Increase ASA 81 mg   to 4 once daily

## 2010-02-08 NOTE — Progress Notes (Signed)
Summary: Call Report  Phone Note From Other Clinic   Caller: Patient Caller: Rose form [place] Summary of Call[facility] Per Dr.Lowne U/S indicates Diverticulitis-need pharmacy location to rx med. (Patient Aware) Initial call taken by: Shonna Chock,  April 09, 2009 3:57 PM  Follow-up for Phone Call        CIPRO 500MG  Q 12 H FOR 10 DAYS, #20   AND FLAGYL 500MG  Q8H FOR 10 DAYS  #30  SEND LOW RESIDUE DIET F/U [facility] 7- 10 DAYS OR SOONER as needed IF PAIN WORSENS OVER WEEKEND GO TO er Follow-up by: Loreen Freud DO,  April 09, 2009 4:03 PM  Additional Follow-up for Phone Call Additional follow up Details #1::        Sent in meds- called pt to inform her of appt with [facility] and er had to leave message. Army Fossa CMA  April 09, 2009 4:18 PM     New/Updated Medications: CIPRO 500 MG TABS (CIPROFLOXACIN HCL) Take by mouth every 12 hrs for 10 days. FLAGYL 500 MG TABS (METRONIDAZOLE) Take every 8 hrs for 10 days. Prescriptions: FLAGYL 500 MG TABS (METRONIDAZOLE) Take every 8 hrs for 10 days.  #30 x 0   Entered by:   Army Fossa CMA   Authorized by:   Loreen Freud DO   Signed by:   Army Fossa CMA on 04/09/2009   Method used:   Electronically to        [facility] [address] [phone]* (retail)       [address]       [place], [place]  [address]       Ph: [phone] or [phone]       Fax: [phone]   RxID:   [id number] CIPRO 500 MG TABS (CIPROFLOXACIN HCL) Take by mouth every 12 hrs for 10 days.  #20 x 0   Entered by:   Army Fossa CMA   Authorized by:   Loreen Freud DO   Signed by:   Army Fossa CMA on 04/09/2009   Method used:   Electronically to        [facility] [address] [phone]* (retail)       [address]       [place], [place]  [address]       Ph: [phone] or [phone]       Fax: [phone]   RxID:   [id number]

## 2010-02-08 NOTE — Progress Notes (Signed)
Summary: re; Cipro and Flagyl symptoms/ Dr Laury Axon   Phone Note Call from Patient Call back at [facility] Phone [phone]   Caller: Patient Summary of Call: Pt  on Cipro and flagyl for 10 days on 6th day today.      Noticed urine is tea colored for several days, drinking lots of water thinking it was due to contrast I took for CT, but urine is still dark colored -Denies pain or pressure; pt concerned about her kidneys, she  read this am about  Cipro and  to call if urine turns dark colored   Please advise pt has ov monday with [facility] Initial call taken by: Kandice Hams,  April 16, 2009 10:35 AM  Follow-up for Phone Call        can she come in and leave UA in lab---- do UA c&S and draw bmp Follow-up by: Loreen Freud DO,  April 16, 2009 10:44 AM  Additional Follow-up for Phone Call Additional follow up Details #1::        Patient is coming in now to have STAT UA C&S adn BMP. Patient requested the STAT part to get results back today before the weekend and Dr. Laury Axon ok'd it.  Additional Follow-up by: Harold Barban,  April 16, 2009 11:30 AM

## 2010-02-08 NOTE — Assessment & Plan Note (Signed)
Summary: per check out/sfa  Medications Added TOPROL XL 25 MG XR24H-TAB (METOPROLOL SUCCINATE) one daily      Allergies Added:   Visit Type:  Follow-up Primary Provider:  Dr. Alwyn Ren  CC:  pt stated feeling better.  History of Present Illness: 55 yo with history of antiphospholipid antibody syndrome and prior endometrial cancer presented initially for evaluation of dyspnea, palpitations, and chest pressure.  Patient has had episodes of sudden shortness of breath with no trigger since 2007.  They are not exertional and will come on suddenly and last for up to 2 hours at a time.  They are not associated with palpitations or sensation of racing heart rate.  She has mild exertional dyspnea (such as climbing a flight of steps).  She also feels like her heart beats irregularly at times, especially at night when she is lying down in bed.  Finally, she gets episodes of chest pressure.  These are not associated with the dyspnea or the palpitations.  These also occur seemingly at random and can last an extended period of time before resolving.  Nonexertional also. She had one episode several weeks ago where she woke up with her heart racing very fast, but this resolved on its own.   Since last appointment, stress echo showed normal LV systolic function and no evidence for ischemia.  Holter monitor earlier this month showed very frequent PACs as well as runs of up to 5-6 beats of atrial tachycaria.    Labs (7/11): HCT 40.6, K 4.3, creatinine 0.6, TSH normal, free T4 normal Labs (10/11): K 4.8, creatinine 1.1, LFTs normal, LDL 158, HDL 58  Problems Prior to Update: 1)  Chest Pain  (ICD-786.50) 2)  Knee Pain  (ICD-719.46) 3)  Dry Skin  (ICD-701.1) 4)  Dry Eye Syndrome  (ICD-375.15) 5)  Dry Mouth  (ICD-527.7) 6)  Dyspnea/shortness of Breath  (ICD-786.09) 7)  Palpitations  (ICD-785.1) 8)  Hiatal Hernia  (ICD-553.3) 9)  Breast Pain, Right  (ICD-611.71) 10)  Diverticulitis of Colon  (ICD-562.11) 11)   Diverticulosis, Colon  (ICD-562.10) 12)  Colonic Polyps, Hx of  (ICD-V12.72) 13)  Abdominal Pain, Left Lower Quadrant  (ICD-789.04) 14)  Dyspnea  (ICD-786.05) 15)  Other and Unspecified Coagulation Defects  (ICD-286.9) 16)  Calf Pain, Left  (ICD-729.5) 17)  Edema- Localized  (ICD-782.3) 18)  Unspecified Menopausal&postmenopausal Disorder  (ICD-627.9) 19)  Unspecified Disease of The Salivary Glands  (ICD-527.9) 20)  Hyperlipidemia  (ICD-272.4) 21)  Allergic Rhinitis  (ICD-477.9) 22)  Allergic Rhinitis Due To Other Allergen  (ICD-477.8)  Current Medications (verified): 1)  Aspir-Low 81 Mg Tbec (Aspirin) 2)  Simvastatin 20 Mg Tabs (Simvastatin) .... One  in The Evening  Allergies (verified): 1)  ! Vioxx  Past History:  Past Medical History: 1. Encephalitis with coma age 12 2. CMV pneumonia 1993 3. Colitis 2006, ? ischemic 4. Clotting disorder followed by Dr Myna Hidalgo (sounds like antiphospholipid antibody syndrome given positive anticardiolipin antibody and positve beta-2 glycoprotetin tests.  ASA 81 mg once daily  5. Edema LLE post ACL sprain 01/2008 6. Allergic rhinitis 7. Hyperlipidemia 8. ?RAD  wheezes with RTIs  9. Colonic polyps, hx of 10. Diverticulosis, colon 11. Endometrial cancer s/p hysterectomy 12. Obesity 13. Stress echo (10/11): EF 60-65%, no significant valvular abnormalities.  No wall motion abnormalities with exertion, suggesting no ischemia.  14. Palpitations: Holter (10/11): 9.6% of beats were atrial ectopy, average HR 78, short runs of up to 5-6 beats of probable atrial tachycaria.    Family  History: Reviewed history from 10/07/2009 and no changes required. Mother with MI in her early 83s GM CVA and CHF; PGF sudden death GM and mother with atrial fibrillation  Social History: Reviewed history from 10/07/2009 and no changes required. 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  Husband had liver transplant.    Review of Systems       All systems reviewed and negative except as per HPI.   Vital Signs:  Patient profile:   55 year old female Height:      67 inches Weight:      280.25 pounds BMI:     44.05 Pulse rate:   78 / minute BP sitting:   112 / 62  (left arm) Cuff size:   regular  Vitals Entered By: Caralee Ates CMA (November 01, 2009 11:58 AM)  Physical Exam  General:  Well developed, well nourished, in no acute distress. Obese.  Neck:  Neck supple, no JVD. No masses, thyromegaly or abnormal cervical nodes. Lungs:  Clear bilaterally to auscultation and percussion. Heart:  Non-displaced PMI, chest non-tender; regular rate and rhythm, S1, S2 without murmurs, rubs or gallops. Carotid upstroke normal, no bruit.  Pedals normal pulses. No edema, no varicosities. Abdomen:  Bowel sounds positive; abdomen soft and non-tender without masses, organomegaly, or hernias noted. No hepatosplenomegaly. Extremities:  No clubbing or cyanosis. Neurologic:  Alert and oriented x 3. Psych:  Normal affect.   Impression & Recommendations:  Problem # 1:  CHEST PAIN (ICD-786.50) Atypical chest pressure and episodes of sudden shortness of breath.  These seem to occur separately.  Episodes are not exertional.  Stress echo showed normal LV systolic function and normal valvular function.  No evidence for ischemia or infarction.  She should continue ASA 81 mg daily given the apparent antiphospholipid antibody syndrome.    Problem # 2:  PALPITATIONS (ICD-785.1) Very frequent atrial ectopy including short runs of atrial tachycardia.  Holter findings suggest a diseased left atrium, and given the family history of atrial fibrillation, I suspect that she has the substrate for atrial fib though none detected so far.  I will have her start Toprol XL 25 mg daily to try to suppress longer runs of atrial tachycardia.    Problem # 3:  HYPERLIPIDEMIA-MIXED (ICD-272.4) Patient recently started on simvastatin for elevated  LDL.  Will ger lipids/LFTs in 2 months.    Patient Instructions: 1)  Your physician has recommended you make the following change in your medication:  2)  Start Toprol XL 25mg  daily. 3)  Your physician recommends that you schedule a follow-up appointment in: 4 months with Dr Shirlee Latch. Prescriptions: TOPROL XL 25 MG XR24H-TAB (METOPROLOL SUCCINATE) one daily  #30 x 6   Entered by:   Katina Dung, RN, BSN   Authorized by:   Marca Ancona, MD   Signed by:   Katina Dung, RN, BSN on 11/01/2009   Method used:   [facility]        [facility] [address] [phone]* (retail)       [address]       [place], [place]  [address]       Ph: [phone] or [phone]       Fax: [phone]   RxID:   [id number]

## 2010-02-08 NOTE — Progress Notes (Signed)
Summary: Mammogram Results   Phone Note Outgoing Call   Call placed by: Army Fossa CMA,  May 27, 2009 11:49 AM Reason for Call: Discuss lab or test results Summary of Call: Regarding Mammogram, LMTCB:  normal---please call patient Signed by Loreen Freud DO on 05/27/2009 at 11:40 AM  left message on machine for pt to return call........Marland KitchenShary Decamp  May 27, 2009 12:56 PM  DISCUSS WITH PATIENT....................Marland KitchenFelecia Deloach CMA  May 28, 2009 8:28 AM

## 2010-02-08 NOTE — Miscellaneous (Signed)
Summary: Orders Update  Clinical Lists Changes  Orders: Added new Referral order of Cardiology Referral (Cardiology) - Signed 

## 2010-02-08 NOTE — Miscellaneous (Signed)
Summary: Appointment Canceled  Appointment status changed to canceled by [employer] on 10/11/2009 4:34 PM.  Cancellation Comments --------------------- c60/stress echo/ with sherry/saf  Appointment Information ----------------------- Appt Type:  EMR NO DOCUMENT      Date:  Monday, October 18, 2009      Time:  2:00 PM for 30 min   Urgency:  Routine   Made By:  Pearson Grippe  To Visit:  [id number]    Reason:  c60/stress echo/ with sherry/saf  Appt Comments ------------- -- 10/11/09 16:34: (CEMR) CANCELED -- c60/stress echo/ with sherry/saf -- 10/07/09 10:42: (CEMR) BOOKED -- Routine EMR NO DOCUMENT at 10/18/2009 2:00 PM for 30 min c60/stress echo/ with sherry/saf

## 2010-02-08 NOTE — Assessment & Plan Note (Signed)
Summary: RT BREAST PAIN/RH......Marland Kitchen   Vital Signs:  Patient profile:   55 year old female Weight:      283 pounds Pulse rate:   76 / minute Pulse rhythm:   regular BP sitting:   118 / 72  (left arm) Cuff size:   large  Vitals Entered By: Army Fossa CMA (May 26, 2009 10:57 AM) CC: Pt here she states through the middle of the night she noticied a lot of pain in her right breast.   History of Present Illness: Pt here c/o R breast pain with palpation--pt denies CP, SOB, palp.  No other complaints.    Current Medications (verified): 1)  Aspir-Low 81 Mg Tbec (Aspirin) 2)  Ultram 50 Mg Tabs (Tramadol Hcl) .Marland Kitchen.. 1-2 By Mouth Every 6 Hours As Needed  Allergies: 1)  ! Vioxx  Past History:  Past medical, surgical, family and social histories (including risk factors) reviewed for relevance to current acute and chronic problems.  Past Medical History: Reviewed history from 04/19/2009 and no changes required. encephalitis with coma age 41; CMV pneumonia 1993; colitis 2006; clotting disorder followed by Dr Myna Hidalgo, ASA 81 mg once daily  Edema LLE post ACL sprain 01/2008 Allergic rhinitis Hyperlipidemia ?RAD  wheezes with RTIs  Colonic polyps, hx of Diverticulosis, colon  Past Surgical History: Reviewed history from 04/19/2009 and no changes required. fibroidectomy Colon polypectomy 2006, Dr Marina Goodell, ? due 2011 Hysterectomy & BSO for endometrial Ca Tonsillectomy  Family History: Reviewed history from 07/03/2006 and no changes required. GM CVA; PGF sudden death  Social History: Reviewed history from 10/31/2008 and no changes required. 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 Breasts:  skin/areolae normal, no masses,  no abnormal thickening, no nipple discharge, and R breast tender- @ 4 o'clock position Lungs:  Normal respiratory effort, chest expands symmetrically. Lungs are clear to auscultation, no crackles or wheezes. Heart:  Normal rate and regular rhythm. S1 and S2 normal without gallop, murmur, click, rub or other extra sounds. Abdomen:  soft and epigastric tenderness.   Psych:  Oriented X3 and normally interactive.     Impression & Recommendations:  Problem # 1:  BREAST PAIN, RIGHT (ICD-611.71) ultram 50 mg 1-2 by mouth every 6 hours as needed  Orders: Radiology Referral (Radiology) EKG w/ Interpretation (93000)  Problem # 2:  HIATAL HERNIA (ICD-553.3)  take zantac daily Pt may need EGD if symptoms do not subside  Orders: EKG w/ Interpretation (93[address])  Complete Medication List: 1)  Aspir-low 81 Mg Tbec (Aspirin) 2)  Ultram 50 Mg Tabs (Tramadol hcl) .Marland Kitchen.. 1-2 by mouth every 6 hours as needed Prescriptions: ULTRAM 50 MG TABS (TRAMADOL HCL) 1-2 by mouth every 6 hours as needed  #60 x 0   Entered and Authorized by:   Loreen Freud DO   Signed by:   Loreen Freud DO on 05/26/2009   Method used:   [facility] to        [facility] [address] [phone]* (retail)       [address]       [place], [place]  [address]       Ph: [phone] or [phone]       Fax: [phone]   RxID:   [id number]  EKG  Procedure date:  05/26/2009  Findings:      Normal sinus rhythm with rate of: 78,   PVC's noted.

## 2010-02-08 NOTE — Assessment & Plan Note (Signed)
Summary: fu/kdc   Vital Signs:  Patient profile:   55 year old female Weight:      222.0 pounds Temp:     98.4 degrees F oral Pulse rate:   60 / minute Resp:     15 per minute BP sitting:   116 / 74  (left arm) Cuff size:   large  Vitals Entered By: Shonna Chock (April 19, 2009 3:17 PM) CC: Follow-up visit:  Comments REVIEWED MED LIST, PATIENT AGREED DOSE AND INSTRUCTION CORRECT    Primary Care Provider:  Alfonse Flavors  CC:  Follow-up visit: .  History of Present Illness: No symptoms @ present ; labs & CT scan reviewed. Initial UA revealed hematuria but none on repeat 04/08.? due for F/U colonoscopy for polyp survelliance.  Allergies: 1)  ! Vioxx  Past History:  Past Medical History: encephalitis with coma age 67; CMV pneumonia 78; colitis 2006; clotting disorder followed by Dr Myna Hidalgo, ASA 81 mg once daily  Edema LLE post ACL sprain 01/2008 Allergic rhinitis Hyperlipidemia ?RAD  wheezes with RTIs  Colonic polyps, hx of Diverticulosis, colon  Past Surgical History: fibroidectomy Colon polypectomy 2006, Dr Marina Goodell, ? due 2011 Hysterectomy & BSO for endometrial Ca Tonsillectomy  Review of Systems General:  Denies chills, fever, and sweats. GU:  Denies discharge, dysuria, and hematuria.  Physical Exam  General:  well-nourished,in no acute distress; alert,appropriate and cooperative throughout examination Eyes:  No corneal or conjunctival inflammation noted.No icterus Heart:  regular rhythm, no murmur, no gallop, no rub, no JVD, no HJR, and bradycardia.   Abdomen:  Bowel sounds positive,abdomen soft and non-tender without masses, organomegaly or hernias noted. Skin:  Intact without suspicious lesions or rashes. no jaundice Cervical Nodes:  No lymphadenopathy noted Axillary Nodes:  No palpable lymphadenopathy   Impression & Recommendations:  Problem # 1:  DIVERTICULITIS OF COLON (ICD-562.11) resolved  Problem # 2:  COLONIC POLYPS, HX OF (ICD-V12.72) as per Dr  Marina Goodell  Complete Medication List: 1)  Ranitidine Hcl 150 Mg Tabs (Ranitidine hcl) .... Take 1 tab two times a day pre mealsx 1 month 2)  Aspir-low 81 Mg Tbec (Aspirin) 3)  Flagyl 500 Mg Tabs (Metronidazole) .... Take every 8 hrs for 10 days.  Patient Instructions: 1)  Resume high roughage in diet; take care with seeds , popcorn , etc as discussed

## 2010-02-08 NOTE — Progress Notes (Signed)
Summary: question on meds   Phone Note Call from Patient Call back at Home Phone [phone]   Caller: Patient Reason for Call: Talk to Nurse Summary of Call: pt has question on meds pt is going to the dentist this morning. Initial call taken by: Roe Coombs,  November 24, 2009 8:05 AM  Follow-up for Phone Call        adv pt because no valve issue that ABX not indicated at this time. pt expressed understanding.  Follow-up by: Claris Gladden RN,  November 24, 2009 8:20 AM

## 2010-02-08 NOTE — Letter (Signed)
Summary: [facility]  [facility]   Imported By: Lanelle Bal 07/21/2009 09:23:38  _____________________________________________________________________  External Attachment:    Type:   Image     Comment:   External Document

## 2010-02-08 NOTE — Progress Notes (Signed)
Summary: ABX prior to dentistry  Phone Note Call from Patient Call back at Home Phone [phone]   Summary of Call: Patient notes that she has dentist appt and will need ABX prior. Please advise. Initial call taken by: Lucious Groves CMA,  November 24, 2009 8:52 AM  Follow-up for Phone Call        Per [facility]pper  amox 500 4 pills 30-60 min pre op. discuss with patient, rx sent to pharmacy..............Marland KitchenFelecia Deloach CMA  November 24, 2009 9:26 AM     New/Updated Medications: AMOXICILLIN 500 MG TABS (AMOXICILLIN) 4 pills 30 to 60 minutes pre-surgery Prescriptions: AMOXICILLIN 500 MG TABS (AMOXICILLIN) 4 pills 30 to 60 minutes pre-surgery  #20 x 0   Entered by:   Jeremy Johann CMA   Authorized by:   Marga Melnick MD   Signed by:   Jeremy Johann CMA on 11/24/2009   Method used:   [facility]ed to ...       [facility] [address] [phone]* (retail)       [address]       [place], [place]  [address]       Ph: [phone] or [phone]       Fax: [phone]   RxID:   [id number]

## 2010-02-08 NOTE — Progress Notes (Signed)
Summary: Stress Echo Pre-Procedure  Phone Note Outgoing Call Call back at Home Phone [phone]   Call placed by: Irean Hong, RN,  October 13, 2009 2:54 PM Summary of Call: Left message with information for Sress Echo tomorrow. Patsy Edwards,RN.

## 2010-02-08 NOTE — Progress Notes (Signed)
  Phone Note Other Incoming   Caller: call a nurse Summary of Call: notified of elevated D-dimer  chart reviewed note from today incomplete but no breathing problems ONly concern was in the calf Ultrasound done and was normal so significance of elevated d-dimer is unclear No action indicated tonight will let Dr Alwyn Ren review and decide about further action Initial call taken by: Cindee Salt MD,  March 08, 2009 6:47 PM

## 2010-02-08 NOTE — Assessment & Plan Note (Signed)
Summary: abdominal pain/kdc   Vital Signs:  Patient profile:   55 year old female Height:      67 inches Weight:      279 pounds BMI:     43.86 Pulse rate:   70 / minute Pulse rhythm:   regular BP sitting:   126 / 84  (left arm) Cuff size:   large  Vitals Entered By: Army Fossa CMA (April 09, 2009 10:49 AM) CC: Pt here c/o lower abdominal pain x 1 weeks. If she moves in the wrong direction she has a sharp pain., Abdominal Pain   History of Present Illness:       This is a 55 year old woman who presents with Abdominal Pain.  The patient denies nausea, vomiting, diarrhea, constipation, melena, hematochezia, anorexia, and hematemesis.  The location of the pain is left lower quadrant.  The pain is described as intermittent.  The patient denies the following symptoms: fever, weight loss, dysuria, chest pain, jaundice, dark urine, missed menstrual period, and vaginal bleeding.  The pain is worse with movement.  No N/V/D.  Current Medications (verified): 1)  Ranitidine Hcl 150 Mg Tabs (Ranitidine Hcl) .... Take 1 Tab Two Times A Day Pre Mealsx 1 Month 2)  Aspir-Low 81 Mg Tbec (Aspirin)  Allergies (verified): 1)  ! Vioxx  Past History:  Past medical, surgical, family and social histories (including risk factors) reviewed for relevance to current acute and chronic problems.  Past Medical History: Reviewed history from 03/08/2009 and no changes required. encephalitis with coma age 52; CMV pneumonia 1993; colitis 2006; clotting disorder followed by Dr Myna Hidalgo, ASA 81 mg once daily  Edema LLE post ACL sprain 01/2008 Allergic rhinitis Hyperlipidemia ?RAD  wheezes with RTIs   Past Surgical History: Reviewed history from 07/03/2006 and no changes required. fibroidectomy Colon polypectomy Hysterectomy & BSO for endometrial Ca Tonsillectomy  Family History: Reviewed history from 07/03/2006 and no changes required. GM CVA; PGF sudden death  Social History: Reviewed history from  10/31/2008 and no changes required. 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   Neck:  No deformities, masses, or tenderness noted. Abdomen:  LLQ tenderness with palpation , soft, normal bowel sounds, no masses, and guarding.   Psych:  Cognition and judgment appear intact. Alert and cooperative with normal attention span and concentration. No apparent delusions, illusions, hallucinations   Impression & Recommendations:  Problem # 1:  ABDOMINAL PAIN, LEFT LOWER QUADRANT (ICD-789.04)  Orders: Venipuncture ([address]) TLB-BMP (Basic Metabolic Panel-BMET) (80048-METABOL) TLB-CBC Platelet - w/Differential (85025-CBCD) TLB-Hepatic/Liver Function Pnl (80076-HEPATIC) TLB-Amylase (82150-AMYL) TLB-Lipase (83690-LIPASE) Radiology Referral (Radiology) UA Dipstick w/o Micro (manual) (60454)  Discussed symptom control with the patient.   Complete Medication List: 1)  Ranitidine Hcl 150 Mg Tabs (Ranitidine hcl) .... Take 1 tab two times a day pre mealsx 1 month 2)  Aspir-low 81 Mg Tbec (Aspirin)  Laboratory Results   Urine Tests    Routine Urinalysis   Color: yellow Appearance: Clear Glucose: negative   (Normal Range: Negative) Bilirubin: negative   (Normal Range: Negative) Ketone: negative   (Normal Range: Negative) Spec. Gravity: 1.015   (Normal Range: 1.003-1.035) Blood: trace-lysed   (Normal Range: Negative) pH: 6.5   (Normal Range: 5.0-8.0) Protein: negative   (Normal Range: Negative) Urobilinogen: 0.2   (Normal Range: 0-1) Nitrite: negative   (  Normal Range: Negative) Leukocyte Esterace: negative   (Normal Range: Negative)    Comments: Army Fossa CMA  April 09, 2009 11:10 AM

## 2010-02-08 NOTE — Miscellaneous (Signed)
Summary: Orders Update   Clinical Lists Changes  Problems: Added new problem of DYSPNEA (ICD-786.05) Orders: Added new Referral order of Radiology Referral (Radiology) - Signed

## 2010-02-10 NOTE — Discharge Summary (Signed)
Summary: Discharge Summary  NAMELILLIE, [name]                ACCOUNT NO.:  [id number]   MEDICAL RECORD NO.:  [id number]          PATIENT TYPE:  INP   LOCATION:  6704                         FACILITY:  [facility]   PHYSICIAN:  Georgina Quint. Plotnikov, M.D. LHCDATE OF BIRTH:  09-15-1955   DATE OF ADMISSION:  08/24/2004  DATE OF DISCHARGE:  08/27/2004                                 DISCHARGE SUMMARY   DISCHARGE MEDICATIONS:  1.  Phenergan 25 mg four times a day as needed for nausea.  2.  Lomotil one to two four times a day as needed for diarrhea.  3.  K-Dur 20 mEq p.o. daily for one week.   DIET:  Avoid spicy, greasy foods for one week.   FOLLOW-UP PLANS:  Dr. Alwyn Ren in one week.   SPECIAL INSTRUCTIONS:  Call if problems.   DISCHARGE DIAGNOSES:  1.  Left-sided colitis, infectious versus ischemic.  2.  Dehydration.  3.  Hematochezia due to problem #1.  4.  Hypokalemia.   HISTORY:  The patient is a 55 year old female who was admitted for rectal  bleeding and abdominal pain on August 24, 2004.  She had nausea, vomiting  and then soft stool with blood.  For the details, please address Dr.  Frederik Pear history and physical on August 24, 2004.   HOSPITAL COURSE:  During the course of hospitalization, the patient was  treated with IV fluids, IV antibiotics and potassium.  She was seen in  consultation by Dr. Christella Hartigan with gastroenterology, underwent colonoscopy  which was suspicious for mild ischemic colitis on the left.  As a result,  she had  CT scan with angiogram that was suspicious for small-vessel  disease; however, no major blockage was noted.  Colitis in the left colon  was noted again.   PHYSICAL EXAMINATION:  GENERAL:  On the day of discharge, she was feeling  well, no abdominal pain, nausea, vomiting or diarrhea, no blood in the  stools.  VITAL SIGNS:  Temperature 98.0, heart rate 74, respirations 20, blood  pressure 126/67.  Saturation 94% on room air.  She had soft stool  yesterday  without blood.  She was in no acute distress.  HEENT:  Moist.  CHEST:  Lungs clear.  CARDIAC:  Regular.  ABDOMEN:  Soft, nontender.  No organomegaly, no mass felt.  EXTREMITIES:  Lower extremities without edema.   LABORATORY DATA:  On the day of discharge, white count 5.9, hemoglobin 30.9.  Potassium 3.4, creatinine 1.9.           ______________________________  Georgina Quint. Plotnikov, M.D. [facility]     AVP/MEDQ  D:  08/27/2004  T:  08/27/2004  Job:  [id number]   cc:   Titus Dubin. Alwyn Ren, M.D. [facility]  [phone] [place]  [place] [address]

## 2010-02-10 NOTE — Assessment & Plan Note (Signed)
Summary: DISCUSS BP,MEDS/KB--also talk abt Dr Shirlee Latch visit, diverticul...   Vital Signs:  Patient profile:   55 year old female Weight:      279 pounds BMI:     43.86 Temp:     98.6 degrees F oral Pulse rate:   72 / minute Resp:     15 per minute BP sitting:   118 / 76  (left arm) Cuff size:   large  Vitals Entered By: Shonna Chock CMA (January 17, 2010 11:25 AM) CC: 1.) F/u on diverticulitis   2.) Discomfort with BM   3.) Discuss Meds: Patient never filled Toprol, would like to discuss with Dr.Novelle Addair first. Patient stopped Simvastain-? reaction (does'nt recall the type of reaction), Abdominal pain, Lipid Management   Primary Care Provider:  Dr. Alwyn Ren  CC:  1.) F/u on diverticulitis   2.) Discomfort with BM   3.) Discuss Meds: Patient never filled Toprol, would like to discuss with Dr.Geroge Gilliam first. Patient stopped Simvastain-? reaction (does'nt recall the type of reaction), Abdominal pain, and Lipid Management.  History of Present Illness:      This is a 55 year old woman who presents with Abdominal pain.  The patient  now denies nausea, vomiting, diarrhea, constipation, melena, hematochezia, anorexia, and hematemesis.  The location of the pain is left lower quadrant.  The pain is described as intermittent, sharp ( " a jab 3-4X/ day"), and non radiating. The patient denies the following symptoms: fever, weight loss, dysuria, chest pain, jaundice, and dark urine.  The pain is worse with more complex  foods after being on clear liquids 12/18-23. She took Cipro & Flagyl 12/18 - 28 with resolution of acute diverticulitis. Urine was dark  while on these. She also had diverticulitis in 04/2009. She is due for colonoscopy.     Hyperlipidemia Follow-Up:Other symptoms include dypsnea and palpitations.  The patient denies the following symptoms: chest pain/pressure and syncope.  Adjunctive measures currently used by the patient include ASA and  flax seed  oil supplements.  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 She has not  taken a statin  for > 1 week. Dr Shirlee Latch Rxed Simvastatin. NMR reviewed; LDL goal  = < 100.  Lipid Management History:      Positive NCEP/ATP III risk factors include female age 3 years old or older and early menopause without estrogen hormone replacement.  Negative NCEP/ATP III risk factors include non-diabetic, no family history for ischemic heart disease, non-tobacco-user status, non-hypertensive, no ASHD (atherosclerotic heart disease), no prior stroke/TIA, no peripheral vascular disease, and no history of aortic aneurysm.     Allergies: 1)  ! Vioxx  Past History:  Past Medical History: 1. Encephalitis with coma age 30 2. CMV pneumonia 1994 3. Colitis  08/2004, ? ischemic 4. 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  ASA 81 mg once daily  5. Edema LLE post ACL sprain 01/2008 6. Allergic rhinitis 7. Hyperlipidemia: NMR Lipoprofile 2006: LDL 167( 2434/1289), HDL 39, Tg 153. LDL goal = < 100. Framingham Study LDL goal = < 130. 8. ? RAD ( wheezes with RTIs  only) 9. Colonic polyps, PMH  of 01/2004 10. Diverticulosis, colon 11. Endometrial cancer S/P  hysterectomy & BSO 12. Obesity 13. Stress echo (10/11): EF 60-65%, no significant valvular abnormalities.  No wall motion abnormalities with exertion, suggesting no ischemia.  14.  Palpitations: Holter (10/11): 9.6% of beats were atrial ectopy, average HR 78, short runs of up to 5-6 beats of probable atrial tachycaria, Dr Shirlee Latch .   15. false + ANA during evaluation of facial Rosacea  Past Surgical History: fibroidectomy in 1980s; Colonoscopy : ischemic colitis 08/26/2004 Colon polypectomy 01/26/2004, Dr Marina Goodell, ? due 2011 Hysterectomy & BSO for endometrial  cancer 01/2004 Tonsillectomy G 0 P 0  Family History: Mother with MI in her  early 37s GM CVA and CHF; PGF sudden death GM and mother with atrial fibrillation Father: asthma,DM, colostomy post Diverticulitis, liver transplant Mother: MI in 53s, DVT, lung cancer Siblings:sister : asthma; MGF: prostate CA; MGM:MI,?CHF, DVT/PTE;PGF: sudden death  Physical Exam  General:  in no acute distress; alert,appropriate and cooperative throughout examination Eyes:  No corneal or conjunctival inflammation noted. No icterus Mouth:  Oral mucosa and oropharynx without lesions or exudates.  Teeth in good repair. No pharyngeal erythema.   Lungs:  Normal respiratory effort, chest expands symmetrically. Lungs are clear to auscultation, no crackles or wheezes. Heart:  Normal rate and regular rhythm. S1 and S2 normal without gallop, murmur, click, rub .S4 Abdomen:  Bowel sounds positive,abdomen soft and non-tender without masses, organomegaly or hernias noted. Dullness RUQ  Pulses:  R and L carotid,radial,dorsalis pedis and posterior tibial pulses are full and equal bilaterally Extremities:  No clubbing, cyanosis, edema. Neurologic:  alert & oriented X3.   Skin:  Intact without suspicious lesions or rashes. No jaundice Cervical Nodes:  No lymphadenopathy noted Axillary Nodes:  No palpable lymphadenopathy Psych:  memory intact for recent and remote, normally interactive, and good eye contact.     Impression & Recommendations:  Problem # 1:  ABDOMINAL PAIN, LEFT LOWER QUADRANT (ICD-789.04)  Orders: Gastroenterology Referral (GI)  Problem # 2:  HYPERLIPIDEMIA-MIXED (ICD-272.4)  The following medications were removed from the medication list:    Simvastatin 20 Mg Tabs (Simvastatin) ..... One  in the evening  Problem # 3:  PALPITATIONS (ICD-785.1) She is concerned Toprol is dropping BP excessively  Her updated medication list for this problem includes:    Toprol Xl 25 Mg Xr24h-tab (Metoprolol succinate) ..... One daily  Complete Medication List: 1)  Aspir-low 81 Mg Tbec  (Aspirin) 2)  Toprol Xl 25 Mg Xr24h-tab (Metoprolol succinate) .... One daily  Lipid Assessment/Plan:      Based on NCEP/ATP III, the patient's risk factor category is "0-1 risk factors".  The patient's lipid goals are as follows: Total cholesterol goal is 200; LDL cholesterol goal is 100; HDL cholesterol goal is 40; Triglyceride goal is 150.    Patient Instructions: 1)  Keep diary of BP; report findings to Dr Alford Highland nurse. Take Align once daily until seen by Dr Marina Goodell. Scheddule fasting Boston Heart Panel ( 272.4).   Orders Added: 1)  Est. Patient Level IV [14782] 2)  Gastroenterology Referral [GI]

## 2010-02-10 NOTE — Progress Notes (Signed)
Summary: BP check appt  Phone Note Outgoing Call   Summary of Call: I called the patient to come in for BP check and med discussion with the nurse. She declined appt stating that she is going out of town and has other issues she would like to discuss directly with Hop. Patient made appt for when she returns from her trip. Initial call taken by: Lucious Groves CMA,  December 30, 2009 10:42 AM

## 2010-02-10 NOTE — Consult Note (Signed)
Summary: consultation   NAMESHARECE, FLEISCHHACKER NO.:  [id number]   MEDICAL RECORD NO.:  [id number]          PATIENT TYPE:  OUT   LOCATION:  GYN                          FACILITY:  [facility]   PHYSICIAN:  De Blanch, M.D.DATE OF BIRTH:  1955/03/29   DATE OF CONSULTATION:  12/29/2003  DATE OF DISCHARGE:                                   CONSULTATION   A 55 year old white female seen in consultation at the request of Dr. Teodora Medici regarding management of grade 1 endometrial carcinoma. The patient was  found to have a thickened endometrial stripe and underwent endometrial  curettings and polypectomy on December 18, 2003. 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  She has had an uncomplicated postoperative course.   PAST MEDICAL HISTORY:  Medical illnesses none.   PAST SURGICAL HISTORY:  1.  Myomectomy.  2.  Laparoscopy with findings of adhesions. Interestingly, the patient      claims that her ovarian function stopped after that laparoscopic      procedure.  The operative note is not available nor is the pathology      available for our review.  3.  Laparoscopic cholecystectomy.   ALLERGIES:  Vioxx.   CURRENT MEDICATIONS:  None.   OBSTETRICAL HISTORY:  Gravida 0.   SOCIAL HISTORY:  The patient is married, she does not smoke, she works as a  Scientist, water quality.   FAMILY HISTORY:  Negative for gynecologic, breast or colon cancer.   REVIEW OF SYMPTOMS:  Negative except as noted above.   PHYSICAL EXAMINATION:  VITAL SIGNS:  Height 5 foot 6, weight 266 pounds,  blood pressure 118/82.  Pulse 72, respiratory rate 18.  GENERAL:  The patient is an obese, pleasant, white female in no acute  distress.  HEENT:  Negative.  NECK:  Supple without thyromegaly. There was no supraclavicular or inguinal  adenopathy.  ABDOMEN:  Obese, soft, nontender, no mass, organomegaly, ascites or  hernias  are noted.  She has an umbilical scar which is well healed.  PELVIC:  EGBUS, vagina, bladder, urethra are normal. Cervix appears normal.  The uterus is difficult to outline because of the patient's obesity but I  believe it is essentially normal size.   IMPRESSION:  Clinical stage 1 grade 1 endometrial carcinoma.   I had a lengthy discussion with the patient, her husband, her mother and her  sister regarding management of this malignancy. I have recommended that she  undergo a total abdominal hysterectomy, bilateral salpingo-oophorectomy and  possible more extended surgical staging depending upon of depth of invasion.  The risks of surgery were all outlined.  I indicated I do not believe this  surgery can be done vaginally given her prior surgery with adhesions.  We  will coordinate surgery with Dr. Chevis Pretty.  The risks of surgery have been  outlined and all of their questions are answered.     Dani   DC/MEDQ  D:  12/29/2003  T:  12/29/2003  Job:  [id number]   cc:   Dimas Aguas  Jessie Foot, M.D.  [phone]  [place]  [place] [address]  Fax: [phone]   Titus Dubin. Alwyn Ren, M.D. [facility]   Telford Nab, R.N.  [address]  [place], [place] [address]

## 2010-02-10 NOTE — Op Note (Signed)
Summary: Operative Report   NAME:  [name], [name]                ACCOUNT NO.:  [id number]   MEDICAL RECORD NO.:  [id number]          PATIENT TYPE:  INP   LOCATION:  0009                         FACILITY:  [facility]   PHYSICIAN:  De Blanch, M.D.DATE OF BIRTH:  04-02-1955   DATE OF PROCEDURE:  02/09/2004  DATE OF DISCHARGE:                                 OPERATIVE REPORT   PREOPERATIVE DIAGNOSIS:  Grade 1 endometrial adenocarcinoma.   POSTOPERATIVE DIAGNOSIS:  Grade 1 endometrial adenocarcinoma.   PROCEDURE:  Exploratory laparotomy, lysis of extensive adhesions, total  abdominal hysterectomy and bilateral salpingo-oophorectomy.   SURGEON:  De Blanch, M.D.   ASSISTANT:  Leatha Gilding. Mezer, M.D., Telford Nab, R.N.   ANESTHESIA:  General with orotracheal tube.   ESTIMATED BLOOD LOSS:  200 cc.   FINDINGS:  At the time of exploratory laparotomy, the patient had extensive  adhesions nearly completely obliterating her gynecologic pelvic structures.  The sigmoid colon was densely adherent to the pelvic sidewalls as well as  the posterior aspect of the uterus.  The bladder flap was folded over itself  and adherent to the fundus of the uterus.  The tubes and ovaries on the left  side were densely adherent to the pelvic sidewall and the mesentery of the  sigmoid colon.  The upper abdomen, including liver, spleen, stomach, small  and large bowel, and omentum were normal.  There was no enlarged pelvic or  periaortic lymph nodes.   DESCRIPTION OF PROCEDURE:  The patient was brought to the operating room and  while she was awake was positioned in Stronach stirrups, with care taken to  avoid strain on her leg or back.  She then underwent general anesthesia.  The anterior abdominal wall, perineum, and vagina were prepped with  Betadine.  A Foley catheter was inserted, and the abdomen was draped.   The lower abdomen was incised with a midline incision and the peritoneal  cavity opened.  Peritoneal washings were obtained.  The upper abdomen was  explored, with the above-noted findings.  A Bookwalter retractor was  positioned and the bowel packed out of the pelvis.  Careful tedious  dissection was performed, lysing adhesions of the sigmoid colon from the  uterus and the pelvic sidewall.  The left round ligament was divided and the  retroperitoneal space opened, identifying the vessels and ureter.  The  ovarian vessels were skeletonized, clamped, cut, free-tied, and suture  ligated using 2-0 Vicryl.  The ovary was then sharply dissected from the  mesentery of the sigmoid colon, with care taken to avoid injury to the  bowel.   Additional adhesions between the back of the uterus to the fundus were lysed  with sharp dissection.  The left round ligament was divided and the  retroperitoneal space opened, identifying the vessels and ureter.  The  ovarian vessels on the right were then clamped, cut, free-tied, and suture  ligated.  The posterior leaf of the broad ligament was then incised,  elevating the tube and ovary.  The uterus was then grasped with a long Kelly  clamp across the  cornua.  The bladder flap was then developed using sharp  and blunt dissection.  The uterine vessels on the right were then clamped,  cut, and suture ligated using 2-0 Vicryl.  The left side was further  dissected away from the sigmoid colon until the uterine vessels were  exposed.  These were then clamped, cut, and suture ligated in stepwise  fashion.  The paracervical and cardinal ligaments were clamped, cut, and  suture ligated.  The bladder continued to be advanced with sharp dissection.  The vaginal angles were then encountered after lysing additional adhesions  from the posterior cul-de-sac and sigmoid colon.  The vagina angle was then  crossclamped and divided, and vagina was transected from its junction at the  cervix.  The vaginal angles were then transfixed with 0 Vicryl  suture and  the central portion of the vagina closed with interrupted figure-of-eight  sutures of 0 Vicryl.  The pelvis was irrigated and hemostasis ascertained.   The packs and retractors were removed after we were informed by frozen  section that there was no evidence of myometrial invasion.  The abdomen was  then closed in layers, the first being a running mass closure using #1 PDS,  incorporating the fascia and rectus muscle.  The subcutaneous tissue was  irrigated.  Hemostasis was achieved with cautery.  The subcutaneous tissue  was reapproximated with 3-0 Vicryl suture.  Skin staples were applied to  approximate the skin.  A dressing was applied.   The patient was awakened from anesthesia and taken to the recovery room in  satisfactory condition.  Sponge, needle, and instrument counts were correct  x2.      DC/MEDQ  D:  02/09/2004  T:  02/09/2004  Job:  [id number]   cc:   Leatha Gilding. Mezer, M.D.  [address]  [place]  [place] [address]  Fax: [phone]   Telford Nab, R.N.  [phone] [address]  [place], [place] [address]

## 2010-02-10 NOTE — Procedures (Signed)
Summary: Colonoscopy-Dr. Christella Hartigan   Colonoscopy  Procedure date:  08/26/2004  Findings:      Location:  [facility].  Results: Hemorrhoids.     Results: Colitis.        Colonoscopy  Procedure date:  08/26/2004  Findings:      Location:  [facility]  Results: Hemorrhoids.     Results: Colitis.        Patient Name: Enda, Santo MRN: [id number] Procedure Procedures: Colonoscopy CPT: 65784.    with biopsy. CPT: Q5068410.  Personnel: Endoscopist: Rachael Fee, MD.  Referred By: Titus Dubin. Alwyn Ren, MD.  Exam Location: Exam performed in Endoscopy Suite. Inpatient-ward  Patient Consent: Procedure, Alternatives, Risks and Benefits discussed, consent obtained, from patient. Consent was obtained by the RN.  Indications Symptoms: Hematochezia.  History  Current Medications: Patient is not currently taking Coumadin.  Pre-Exam Physical: Performed Aug 26, 2004. Entire physical exam was normal. Abnormal PE findings include: obese.  Exam Exam: Extent of exam reached: Terminal Ileum, extent intended: Terminal Ileum.  The cecum was identified by appendiceal orifice and IC valve. Patient position: on left side. Colon retroflexion performed. Images taken. ASA Classification: II. Tolerance: excellent.  Monitoring: Pulse and BP monitoring, Oximetry used. Supplemental O2 given.  Colon Prep Prep results: excellent.  Fluoroscopy: Fluoroscopy was not used.  Sedation Meds: Patient assessed and found to be appropriate for moderate (conscious) sedation. Fentanyl 100 mcg. given IV. Versed 10 mg. given IV.  Findings - NORMAL EXAM: Ileum to Descending Colon.  - OTHER FINDING: Milld inflammation, linear ulcerations, exudate found in Descending Colon. Biopsy/Other Finding taken. Comments: From 20 cm to 55 cm there was patchy erythema, edema.  There were several long (5-7cm long) flat linear, whitish exudative lesions.  There were multiple diverticulum throughout the  sigmoid colon as well.  Multiple biopsies were taken.  - HEMORRHOIDS: Internal and External. Size: Large. Not bleeding. ICD9: Hemorrhoids, Internal and  External: 455.6.   Assessment Abnormal examination, see findings above.  Diagnoses: 455.6: Hemorrhoids, Internal and  External.   Comments: Findings are most consistent with mild ischemic colitis.  Await biopsies, but I recommend a workup for hypercoagulable state and CT scan.  Events  Unplanned Interventions: No intervention was required.  Unplanned Events: There were no complications. Plans Comments: Recommend hypercoagulable workup and CT scan abd/pelvis.  Ischemic colitis was mild and should resolve quickly. Disposition: After procedure patient sent to recovery.   This report was created from the original endoscopy report, which was reviewed and signed by the above listed endoscopist.   cc:  Marga Melnick, MD

## 2010-02-10 NOTE — Assessment & Plan Note (Signed)
Summary: discuss abd pain/kb   Vital Signs:  Patient profile:   55 year old female Height:      67 inches (170.18 cm) Weight:      277.13 pounds (125.97 kg) BMI:     43.56 O2 Sat:      98 % on Room air Temp:     98.4 degrees F (36.89 degrees C) oral Pulse rate:   69 / minute  Vitals Entered By: Lucious Groves CMA (December 29, 2009 4:10 PM)  O2 Flow:  Room air CC: Discuss recent abd pain./kb Comments Patient denies having any pain right now, this was the 2nd episode and lasted about 2-3 days. Patient notes she was given Cipro and Flagy by  Evelena Peat who was on call. Patient also needs to discuss Simvastatin and Metoprolol.   History of Present Illness: 3 days ago developed abdominal fullness, lower abdominal pain and later on left lower quadrant pain. She was also constipated. She talked with the on-call service, they agreed to prescribe Cipro and Flagyl as the symptoms were very similar to the diverticulitis she had 04-2009 She is taking antibiotics and feel much better.  Review of systems With the onset of the symptoms have fever nausea----> symptoms resolved  No vomiting No diarrhea or blood in the stools No dysuria or hematuria She did have some constipation but that has now resolved  Allergies: 1)  ! Vioxx  Past History:  Past Medical History: Reviewed history from 11/01/2009 and no changes required. 1. Encephalitis with coma age 70 2. CMV pneumonia 1993 3. Colitis 2006, ? ischemic 4. Clotting disorder followed by Dr Myna Hidalgo (sounds like antiphospholipid antibody syndrome given positive anticardiolipin antibody and positve beta-2 glycoprotetin tests.  ASA 81 mg once daily  5. Edema LLE post ACL sprain 01/2008 6. Allergic rhinitis 7. Hyperlipidemia 8. ?RAD  wheezes with RTIs  9. Colonic polyps, hx of 10. Diverticulosis, colon 11. Endometrial cancer s/p hysterectomy 12. Obesity 13. Stress echo (10/11): EF 60-65%, no significant valvular abnormalities.  No wall  motion abnormalities with exertion, suggesting no ischemia.  14. Palpitations: Holter (10/11): 9.6% of beats were atrial ectopy, average HR 78, short runs of up to 5-6 beats of probable atrial tachycaria.    Past Surgical History: Reviewed history from 04/19/2009 and no changes required. fibroidectomy Colon polypectomy 2006, Dr Marina Goodell, ? due 2011 Hysterectomy & BSO for endometrial Ca Tonsillectomy  Social History: Reviewed history from 10/07/2009 and no changes required. 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  Husband had liver transplant.   Physical Exam  General:  alert and well-developed.   Lungs:  Normal respiratory effort, chest expands symmetrically. Lungs are clear to auscultation, no crackles or wheezes. Heart:  regular rhythm, no murmur, no gallop, no rub  Abdomen:  soft, non-tender, no distention, no masses, no guarding, and no rigidity.     Impression & Recommendations:  Problem # 1:  DIVERTICULITIS OF COLON (ICD-562.11) CT proven diverticulitis 4-11 treated successfully as an outpatient 3 days ago developed similar symptoms, responding well to antibiotics. Likely she had a second episode of diverticulitis. Plan: Continue antibiotics She is due for her colonoscopy, she will talk to Dr. Marina Goodell about these 2 episodes. I will cc this note to him as well. With talked  about her diet, she is to gradually go back to a regular diet and eventually a high-fiber diet. Written material provided about diverticulitis, from up-to-date.  ( she also lost one pill  of each antibiotics and requests a prescription: done)   Problem # 2:  PALPITATIONS (ICD-785.1) a BP was not recorded today. We'll call the patient  and bring her  back  to  get a BP reading Her updated medication list for this problem includes:    Toprol Xl 25 Mg Xr24h-tab (Metoprolol succinate) ..... One daily  Complete Medication List: 1)  Aspir-low 81 Mg Tbec  (Aspirin) 2)  Simvastatin 20 Mg Tabs (Simvastatin) .... One  in the evening 3)  Toprol Xl 25 Mg Xr24h-tab (Metoprolol succinate) .... One daily 4)  Amoxicillin 500 Mg Tabs (Amoxicillin) .... 4 pills 30 to 60 minutes pre-surgery 5)  Cipro 500mg  and Flagyl 500 Mg  .... Please dispense 1 tablet of each (she lost 1 tablet out of her 10 day supply) Prescriptions: CIPRO 500MG  AND FLAGYL 500 MG please dispense 1 tablet of each (she lost 1 tablet out of her 10 day supply)  #1 x 0   Entered and Authorized by:   Elita Quick E. Paz MD   Signed by:   Nolon Rod. Paz MD on 12/29/2009   Method used:   Print then Give to Patient   RxID:   [id number]    Orders Added: 1)  Est. Patient Level III [14782]

## 2010-02-10 NOTE — Letter (Signed)
Summary: [facility] Instructions  [facility]  [address] [place], [place] [address]   Phone: [phone]  Fax: [phone]       [name]    [birth year]    MRN: [id number]        Procedure Day /Date:THURSDAY, 03/10/10     Arrival Time:1:00 PM     Procedure Time:2:00 PM     Location of Procedure:                    X  [facility] (4th Floor)           PREPARATION FOR COLONOSCOPY WITH MOVIPREP   Starting 5 days prior to your procedure 03/05/10 do not eat nuts, seeds, popcorn, corn, beans, peas,  salads, or any raw vegetables.  Do not take any fiber supplements (e.g. Metamucil, Citrucel, and Benefiber).  THE DAY BEFORE YOUR PROCEDURE         DATE: 03/09/10  DAY: WEDNESDAY  1.  Drink clear liquids the entire day-NO SOLID FOOD  2.  Do not drink anything colored red or purple.  Avoid juices with pulp.  No orange juice.  3.  Drink at least 64 oz. (8 glasses) of fluid/clear liquids during the day to prevent dehydration and help the prep work efficiently.  CLEAR LIQUIDS INCLUDE: Water Jello Ice Popsicles Tea (sugar ok, no milk/cream) Powdered fruit flavored drinks Coffee (sugar ok, no milk/cream) Gatorade Juice: apple, white grape, white cranberry  Lemonade Clear bullion, consomm, broth Carbonated beverages (any kind) Strained chicken noodle soup Hard Candy                             4.  In the morning, mix first dose of MoviPrep solution:    Empty 1 Pouch A and 1 Pouch B into the disposable container    Add lukewarm drinking water to the top line of the container. Mix to dissolve    Refrigerate (mixed solution should be used within 24 hrs)  5.  Begin drinking the prep at 5:00 p.m. The MoviPrep container is divided by 4 marks.   Every 15 minutes drink the solution down to the next mark (approximately 8 oz) until the full liter is complete.   6.  Follow completed prep with 16 oz of clear liquid of your choice (Nothing red or purple).   Continue to drink clear liquids until bedtime.  7.  Before going to bed, mix second dose of MoviPrep solution:    Empty 1 Pouch A and 1 Pouch B into the disposable container    Add lukewarm drinking water to the top line of the container. Mix to dissolve    Refrigerate  THE DAY OF YOUR PROCEDURE      DATE: 03/10/10 DAY: THURSDAY  Beginning at 9:00 a.m. (5 hours before procedure):         1. Every 15 minutes, drink the solution down to the next mark (approx 8 oz) until the full liter is complete.  2. Follow completed prep with 16 oz. of clear liquid of your choice.    3. You may drink clear liquids until 12 NOON (2 HOURS BEFORE PROCEDURE).   MEDICATION INSTRUCTIONS  Unless otherwise instructed, you should take regular prescription medications with a small sip of water   as early as possible the morning of your procedure.         OTHER INSTRUCTIONS  You will need a  responsible adult at least 57 years of age to accompany you and drive you home.   This person must remain in the waiting room during your procedure.  Wear loose fitting clothing that is easily removed.  Leave jewelry and other valuables at home.  However, you may wish to bring a book to read or  an iPod/MP3 player to listen to music as you wait for your procedure to start.  Remove all body piercing jewelry and leave at home.  Total time from sign-in until discharge is approximately 2-3 hours.  You should go home directly after your procedure and rest.  You can resume normal activities the  day after your procedure.  The day of your procedure you should not:   Drive   Make legal decisions   Operate machinery   Drink alcohol   Return to work  You will receive specific instructions about eating, activities and medications before you leave.    The above instructions have been reviewed and explained to me by   _______________________    I fully understand and can verbalize these instructions  _____________________________ Date _________

## 2010-02-10 NOTE — Assessment & Plan Note (Signed)
Summary: LLQ ABD PAIN/YF-3 p.m. work in/cl    History of Present Illness Visit Type: Initial Consult Primary GI MD: Yancey Flemings MD Primary Provider: Marga Melnick, MD Requesting Provider: Marga Melnick, MD Chief Complaint: Patient here for f/u after diverticulitis flare. Patient states she was getting better but still has some LLQ abdominal discomfort as well as an episode (yesterday) of blood in toilet after bowel movements.  History of Present Illness:   55 year old female with morbid obesity, hyperlipidemia, endometrial cancer status post hysterectomy and bilateral salpingo-oophorectomy, diverticulosis complicated by diverticulitis, and a history of ischemic colitis. She presents today regarding abdominal pain and rectal bleeding. Around December 26, 2009 she developed left lower quadrant discomfort consistent with prior bouts of diverticulitis (as confirmed by CT scan in the spring of 2011). She was given a 10 day course of ciprofloxacin and Flagyl. This helped, though incompletely. Still some residual discomfort and pressure. Last evening she noticed red blood with a bowel movement. This concerned her. Currently without  vomiting, fevers, weight loss. Prior index colonoscopy in January 2006 revealed marked diverticulosis, small hemorrhoids, and a diminutive polyp which was removed and found to be hyperplastic. Followup in 10 years recommended. She subsequently underwent colonoscopy in August of 2006 for hematochezia. At that time she was found to have ischemic colitis.Marland Kitchen   GI Review of Systems    Reports abdominal pain and  nausea.     Location of  Abdominal pain: LLQ.    Denies acid reflux, belching, bloating, chest pain, dysphagia with liquids, dysphagia with solids, heartburn, loss of appetite, vomiting, vomiting blood, weight loss, and  weight gain.      Reports diverticulosis, hemorrhoids, and  rectal bleeding.     Denies anal fissure, black tarry stools, change in bowel habit,  constipation, diarrhea, fecal incontinence, heme positive stool, irritable bowel syndrome, jaundice, light color stool, liver problems, and  rectal pain. Preventive Screening-Counseling & Management  Caffeine-Diet-Exercise     Does Patient Exercise: no      Drug Use:  yes.      Current Medications (verified): 1)  Aspir-Low 81 Mg Tbec (Aspirin) .... Take 1 Tablet By Mouth Once A Day 2)  Multivitamins  Tabs (Multiple Vitamin) .... Take 1 Tablet By Mouth Once A Day 3)  Flax  Oil (Flaxseed (Linseed)) .... Take 1 Tablet By Mouth Once A Day  Allergies (verified): 1)  ! Vioxx  Past History:  Past Medical History: Reviewed history from 01/19/2010 and no changes required. 1. Encephalitis with coma age 36 2. CMV pneumonia 1994 3. Colitis  08/2004, ? ischemic 4. 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  ASA 81 mg once daily  5. Edema LLE post ACL sprain 01/2008 6. Allergic rhinitis 7. Hyperlipidemia: NMR Lipoprofile 2006: LDL 167( 2434/1289), HDL 39, Tg 153. LDL goal = < 100. Framingham Study LDL goal = < 130. 8. ? RAD ( wheezes with RTIs  only) 9. Colonic polyps, PMH  of 01/2004 10. Diverticulosis, colon 11. Endometrial cancer S/P  hysterectomy & BSO 12. Obesity 13. Stress echo (10/11): EF 60-65%, no significant valvular abnormalities.  No wall motion abnormalities with exertion, suggesting no ischemia.  14. Palpitations: Holter (10/11): 9.6% of beats were atrial ectopy, average HR 78, short runs of up to 5-6 beats of probable atrial tachycaria, Dr Shirlee Latch .   15. false + ANA during evaluation of facial Rosacea Hemorrhoids  Past Surgical History: fibroidectomy in 1980s;  Colonoscopy : ischemic colitis 08/26/2004 Colon polypectomy 01/26/2004, Dr Marina Goodell, ? due 2011 Hysterectomy & BSO for endometrial  cancer 01/2004 Tonsillectomy Larparoscopy-x 3 for ovarian  cysts G 0 P 0 Cholecystectomy  Family History: Mother with MI in her early 69s GM CVA and CHF; PGF sudden death GM and mother with atrial fibrillation Father: asthma,DM, colostomy post Diverticulitis, liver transplant Mother: MI in 91s, DVT, lung cancer Siblings:sister : asthma MGF: prostate CA; MGM:MI,?CHF, DVT/PTE;PGF: sudden death Family History of Colon Cancer: PGM??? Family History of Clotting disorder: Mother Family History of Colon Polyps: Father Family History of Diabetes: Father ? related to liver transplant? 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  Husband had liver transplant.  Illicit Drug Use - yes-2 cups coffee daily Patient does not get regular exercise.  Drug Use:  yes  Vital Signs:  Patient profile:   55 year old female Height:      67 inches Weight:      278 pounds BMI:     43.70 BSA:     2.33 Pulse rate:   80 / minute Pulse rhythm:   regular BP sitting:   112 / 80  (left arm) Cuff size:   large  Vitals Entered By: Lamona Curl CMA Duncan Dull) (January 19, 2010 3:14 PM)  Physical Exam  General:  Well developed,obese, well nourished, no acute distress. Head:  Normocephalic and atraumatic. Eyes:  PERRLA, no icterus. Ears:  Normal auditory acuity. Nose:  No deformity, discharge,  or lesions. Mouth:  No deformity or lesions. Neck:  Supple; no masses or thyromegaly. Lungs:  Clear throughout to auscultation. Heart:  Regular rate and rhythm; no murmurs, rubs,  or bruits. Abdomen:  Soft,obese, and nondistended. Mild tenderness to palpation in the left lower quadrant.. No masses, hepatosplenomegaly or hernias noted. Normal bowel sounds. Rectal:  deferred until colonoscopy Msk:  Symmetrical with no gross deformities. Normal posture. Pulses:  Normal pulses noted. Extremities:  No clubbing, cyanosis, edema or deformities noted. Neurologic:  Alert and  oriented x4;   grossly normal neurologically. Skin:  Intact without significant lesions or rashes. Psych:  Alert and cooperative. Normal mood and affect.   Impression & Recommendations:  Problem # 1:  ABDOMINAL PAIN, LEFT LOWER QUADRANT (ICD-789.04) recent bout of left lower quadrant pain consistent with recurrent acute diverticulitis (562.11). Significant but incomplete resolution on antibiotics.  Plan: #1. Cipro 500 mg b.i.d. x2 weeks #2. Metronidazole 500 mg b.i.d. x2 weeks #3. Low residue diet. Advance as tolerated  Problem # 2:  RECTAL BLEEDING (ICD-569.3) rectal bleeding. Minor. Possible causes include benign anorectal pathology,mild ischemia, or neoplasia.  Plan: #1 colonoscopy. The nature of the procedure,as well as the risks, benefits, and alternatives were reviewed. She understood and agreed to proceed.Movi prep prescribed. Patient instructed on its use. We will wait 4-6 weeks to be sure that the diverticulitis has completely resolved.  Problem # 3:  POLYP, COLON (ICD-211.3) hyperplastic in January 2006  Other Orders: Colonoscopy (Colon)  Patient Instructions: 1)  colonoscopy [facility] 3/1//12 2:00 pm arrive at 1:00 pm 2)  Movi prep instructions given and Rx. sent to pharmacy along with Cipro 500 mg 1 by mouth two times a day #28 3)  Flagyl 500 mg 1 by mouth two times a day #28 4)  Copy sent to : Marga Melnick, MD 5)  Colonoscopy and Flexible Sigmoidoscopy brochure given.  6)  The medication list was reviewed and reconciled.  All changed /  newly prescribed medications were explained.  A complete medication list was provided to the patient / caregiver. Prescriptions: METRONIDAZOLE 500 MG TABS (METRONIDAZOLE) 1  by mouth two times a day  #28 x 0   Entered by:   Milford Cage NCMA   Authorized by:   Hilarie Fredrickson MD   Signed by:   Milford Cage NCMA on 01/19/2010   Method used:   [facility]        [facility] [address] [phone]* (retail)       [address]       [place], [place]  [address]       Ph: [phone] or [phone]       Fax: [phone]   RxID:   [id number] CIPROFLOXACIN HCL 500 MG TABS (CIPROFLOXACIN HCL) 1 by mouth two times a day  #28 x 0   Entered by:   Milford Cage NCMA   Authorized by:   Hilarie Fredrickson MD   Signed by:   Milford Cage NCMA on 01/19/2010   Method used:   [facility]        [facility] [address] [phone]* (retail)       [address]       [place], [place]  [address]       Ph: [phone] or [phone]       Fax: [phone]   RxID:   [id number] MOVIPREP 100 GM  SOLR (PEG-KCL-NACL-NASULF-NA ASC-C) As per prep instructions.  #1 x 0   Entered by:   Milford Cage NCMA   Authorized by:   Hilarie Fredrickson MD   Signed by:   Milford Cage NCMA on 01/19/2010   Method used:   [facility]        [facility] [address] [phone]* (retail)       [address]       [place], [place]  [address]       Ph: [phone] or [phone]       Fax: [phone]   RxID:   [id number]

## 2010-02-10 NOTE — Progress Notes (Signed)
Summary: Lambert Mody abd pain   Phone Note From Other Clinic Call back at [facility] Phone [phone]   Caller: Lowella Bandy @ Dr Alwyn Ren c-b to pt directly Call For: Dr Marina Goodell Reason for Call: Schedule Patient Appt Summary of Call: Sharp LLQ abd pain. Would like to be seen sooner than first avail 02-28-10. Initial call taken by: Leanor Kail [facility],  January 18, 2010 5:02 PM  Follow-up for Phone Call        Dr.Hopper says it is o.k. for pt. to see the N.P tomorrow but when  Icalled to schedule pt. she says yesterday she had abd. pain which was different from the sharp pain and describes it as a pressure feeling  and saw maroon colored blood in bottom of toilet X1 which is new also.Advised to call Dr.Hopper as she had planned re:new symptoms and she will call me back as to what he advises. Follow-up by: Teryl Lucy RN,  January 19, 2010 9:15 AM

## 2010-02-10 NOTE — Procedures (Signed)
Summary: Colonoscopy   Colonoscopy  Procedure date:  01/26/2004  Findings:      Location:  [facility].  Results: Polyp.  Results: Hemorrhoids.     Results: Diverticulosis.        Colonoscopy  Procedure date:  01/26/2004  Findings:      Location:  [facility].  Results: Polyp.  Results: Hemorrhoids.     Results: Diverticulosis.        Patient Name: [name], [name] MRN: [id number] Procedure Procedures: Colonoscopy CPT: 40981.    with polypectomy. CPT: A3573898.  Personnel: Endoscopist: Wilhemina Bonito. Marina Goodell, MD.  Referred By: Titus Dubin. Alwyn Ren, MD.  Exam Location: Exam performed in Outpatient Clinic. Outpatient  Patient Consent: Procedure, Alternatives, Risks and Benefits discussed, consent obtained, from patient. Consent was obtained by the RN.  Indications Symptoms: Hematochezia.  History  Current Medications: Patient is not currently taking Coumadin.  Pre-Exam Physical: Performed Jan 26, 2004. Entire physical exam was normal.  Exam Exam: Extent of exam reached: Cecum, extent intended: Cecum.  The cecum was identified by appendiceal orifice and IC valve. Patient position: on left side. Colon retroflexion performed. Images taken. ASA Classification: II.  Monitoring: Pulse and BP monitoring, Oximetry used. Supplemental O2 given.  Colon Prep Used MIRALAX for colon prep. Prep results: excellent.  Sedation Meds: Patient assessed and found to be appropriate for moderate (conscious) sedation. Fentanyl 150 mcg. given IV. Versed 17 given IV.  Findings NORMAL EXAM: Cecum to Rectum. Comments: SMALL HEMORRHOIDS.  - DIVERTICULOSIS: Hepatic Flexure to Sigmoid Colon. ICD9: Diverticulosis, Colon: 562.10. Comments: MARKED CHANGES, PARTICULARLY IN THE SIGMOID REGION.  POLYP: Rectum, diminutive, Procedure:  snare without cautery, removed, retrieved, Polyp sent to pathology. ICD9: Colon Polyps: 211.3.   Assessment Abnormal examination, see findings  above.  Diagnoses: 562.10: Diverticulosis, Colon.  211.3: Colon Polyps.  455.0: Hemorrhoids, Internal.   Events  Unplanned Interventions: No intervention was required.  Unplanned Events: There were no complications. Plans Disposition: After procedure patient sent to recovery. After recovery patient sent home.  Scheduling/Referral: Colonoscopy, to Wilhemina Bonito. Marina Goodell, MD, IN 5 YEARS IF POLYP ADENOMATOUS; OTHERWISE 10YEARS,    This report was created from the original endoscopy report, which was reviewed and signed by the above listed endoscopist.    cc:  Marga Melnick, MD      De Blanch, MD      Teodora Medici, MD      The Patient

## 2010-02-10 NOTE — Progress Notes (Signed)
Summary: New symptom/GI appt  Phone Note Call from Patient Call back at Home Phone [phone]   Summary of Call: Patient notes that she will be seeing a NP tomorrow or Friday but has a new symptom-- there was blood in the bottom of the toliet bowl after BM last night. She notes that she did have abd pressure yesterday and the blood was maroon colored. She would like to know if she should still see NP tomorrow or Friday or see doctor today. Patient would also like to know if she should come here for bloodwork. Please advise. Initial call taken by: Lucious Groves CMA,  January 19, 2010 10:16 AM  Follow-up for Phone Call        see if GI can work her in today  ; see symptoms  Follow-up by: Marga Melnick MD,  January 19, 2010 11:17 AM  Additional Follow-up for Phone Call Additional follow up Details #1::        Patient spouse notified. Additional Follow-up by: Lucious Groves CMA,  January 19, 2010 11:23 AM     Appended Document: New symptom/GI appt Dr.Perry you are Dr.of the day and Dr.Doris Gruhn is requesting pt. be worked in today instead of with PA tomorrow.Please advise.  Appended Document: New symptom/GI appt ok. i will see.   Appended Document: New symptom/GI appt Pt. ntfd. of work in appt. for this afternoon.

## 2010-02-10 NOTE — Progress Notes (Signed)
Summary: CALL-A-NURSE  Phone Note Outgoing Call   Details for Reason: Call-A-Nurse Triage Call Report Triage Record Num: [id number] Operator: Arline Asp Loftin Patient Name: [name] Call Date & Time: 12/26/2009 10:10:23AM Patient Phone: [phone] PCP: Marga Melnick Patient Gender: Female PCP Fax : [phone] Patient DOB: 25-Aug-1955 Practice Name: [facility] Reason for Call: Pt with worsening abdominal pain onset 12/17 at night, bloating and tenderness onset 12/15. Last BM 12/14. Miralax taken on 12/17. (Pt typically has a BM daily.) Pain is 4 out of 10. Due to pain being made worse when moving/changing position, disposition to be seen for immediate evaluation. Pt requests MD be called for possible RX, as she has had these flareups previously (last in summer 2011) and takes a "broad spectrum antibiotic which usually takes care of the problem within a few day." Pt "does not want to go to ED, but is trying to take care of this before it gets worse, or unbearable." Last seen per Dr. Alwyn Ren fall of 2011. T. O. R&A for "Cipro 500mg  po BID x10d, and Flagyl 500mg  po TID x10d, disp QS, NR" per Evelena Peat MD [facility] [phone]. Pt instructed to follow up with office if sx worsen or do not improve. Protocol(s) Used: Abdominal Pain Recommended Outcome per Protocol: See ED Immediately Override Outcome if Used in Protocol: Call Provider Immediately RN Reason for Override Outcome: Per Caller Request. Reason for Outcome: Generalized or localized pain made worse with cough, movement, or standing up straight Care Advice:  ~ 12/    I called to check on the patient and she notes that she is much better, but her bowels have not moved in several days. She will come in for office visit tomorrow. Lucious Groves CMA  December 28, 2009 9:06 AM

## 2010-02-10 NOTE — Letter (Signed)
Summary: New Patient letter  [facility]  [address] [place], [place] [address]   Phone: [phone]  Fax: [phone]       01/18/2010 MRN: [id number]  [facility] [address] [place], [place]  [address]  Dear Ms. Weinkauf,  Welcome to the Gastroenterology Division at [facility].    You are scheduled to see Dr.  Marina Goodell  on 02-28-10 at 1:45pm on the 3rd floor at [facility], [address]  We ask that you try to arrive at our office 15 minutes prior to your appointment time to allow for check-in.  We would like you to complete the enclosed self-administered evaluation form prior to your visit and bring it with you on the day of your appointment.  We will review it with you.  Also, please bring a complete list of all your medications or, if you prefer, bring the medication bottles and we will list them.  Please bring your insurance card so that we may make a copy of it.  If your insurance requires a referral to see a specialist, please bring your referral form from your primary care physician.  Co-payments are due at the time of your visit and may be paid by cash, check or credit card.     Your office visit will consist of a consult with your physician (includes a physical exam), any laboratory testing he/she may order, scheduling of any necessary diagnostic testing (e.g. x-ray, ultrasound, CT-scan), and scheduling of a procedure (e.g. Endoscopy, Colonoscopy) if required.  Please allow enough time on your schedule to allow for any/all of these possibilities.    If you cannot keep your appointment, please call [phone] to cancel or reschedule prior to your appointment date.  This allows [place] the opportunity to schedule an appointment for another patient in need of care.  If you do not cancel or reschedule by 5 p.m. the business day prior to your appointment date, you will be charged a $50.00 late cancellation/no-show fee.    Thank you for choosing  [facility] for your medical needs.  We appreciate the opportunity to care for you.  Please visit [place] at our website  to learn more about our practice.                     Sincerely,                                                             The Gastroenterology Division

## 2010-03-03 ENCOUNTER — Ambulatory Visit (INDEPENDENT_AMBULATORY_CARE_PROVIDER_SITE_OTHER): Payer: PRIVATE HEALTH INSURANCE | Admitting: Internal Medicine

## 2010-03-03 ENCOUNTER — Ambulatory Visit: Payer: Self-pay | Admitting: Cardiology

## 2010-03-03 ENCOUNTER — Encounter: Payer: Self-pay | Admitting: Internal Medicine

## 2010-03-03 ENCOUNTER — Other Ambulatory Visit: Payer: Self-pay | Admitting: Internal Medicine

## 2010-03-03 DIAGNOSIS — E785 Hyperlipidemia, unspecified: Secondary | ICD-10-CM

## 2010-03-03 DIAGNOSIS — R946 Abnormal results of thyroid function studies: Secondary | ICD-10-CM

## 2010-03-03 DIAGNOSIS — H8110 Benign paroxysmal vertigo, unspecified ear: Secondary | ICD-10-CM

## 2010-03-03 HISTORY — DX: Benign paroxysmal vertigo, unspecified ear: H81.10

## 2010-03-04 ENCOUNTER — Ambulatory Visit: Payer: Self-pay | Admitting: Internal Medicine

## 2010-03-04 LAB — TSH: TSH: 4.13 u[IU]/mL (ref 0.35–5.50)

## 2010-03-08 NOTE — Assessment & Plan Note (Signed)
Summary: dizzy,cbs   Vital Signs:  Patient profile:   55 year old female Weight:      280 pounds BMI:     44.01 Temp:     98.3 degrees F oral Pulse (ortho):   70 / minute Resp:     14 per minute BP standing:   124 / 76  Vitals Entered By: Shonna Chock CMA (March 03, 2010 3:33 PM)  Serial Vital Signs/Assessments:  Time      Position  BP       Pulse  Resp  Temp     By 3:33 PM   Lying LA  124/78   65                    Chrae Malloy CMA 3:33 PM   Sitting   120/88   70                    Chrae Malloy CMA 3:33 PM   Standing  124/76   70                    Chrae Malloy CMA   Primary Care Provider:  Marga Melnick, MD  CC:  Syncope and URI symptoms.  History of Present Illness:    Onset @ 3 am as frank vertigo  after she sat up to go to BR .  She  reports palpitations after she had walked to BR  and lightheadedness, but denies near loss of consciousness, chest pain, and shortness of breath.  This am she had a diffuse  headache  after arising.  The patient denies the following symptoms: abdominal discomfort, nausea, vomiting, diaphoresis, focal weakness, and blurred vision.  Ahe had not had  nasal congestion, purulent nasal discharge, sore throat, productive cough, hearing loss, tinnitus,  earache , fever, bilateral facial pain and tooth pain.   Rx: Tylenol for headache with improvement.                                                                                                                                                          [place] Heart Panel reviewed : LDL & TG improved but NMR Lipoprofile LDL goal = < 100. TSH has risen from 4.22 to 5.79.  Current Medications (verified): 1)  Aspir-Low 81 Mg Tbec (Aspirin) .... Take 1 Tablet By Mouth Once A Day 2)  Multivitamins  Tabs (Multiple Vitamin) .... Take 1 Tablet By Mouth Once A Day 3)  Flax  Oil (Flaxseed (Linseed)) .... Take 1 Tablet By Mouth Once A Day  Allergies: 1)  ! Vioxx  Past History:  Past Medical History: 1.  Encephalitis with coma age 62 2. CMV pneumonia 1994 3. Colitis  08/2004, ? ischemic 4. 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  ASA 81 mg once daily  5. Edema LLE post ACL sprain 01/2008 6. Allergic rhinitis 7. Hyperlipidemia: NMR Lipoprofile 2006: LDL 167 ( 2434/1289), HDL 39, TG 153. LDL goal = < 100. Framingham Study LDL goal = < 130. 8. ? RAD ( wheezes with RTIs  only) 9. Colonic polyps, PMH  of 01/2004 10. Diverticulosis, colon 11. Endometrial cancer S/P  hysterectomy & BSO 12. Obesity 13. Stress echo (10/11): EF 60-65%, no significant valvular abnormalities.  No wall motion abnormalities with exertion, suggesting no ischemia.  14. Palpitations: Holter (10/11): 9.6% of beats were atrial ectopy, average HR 78, short runs of up to 5-6 beats of probable atrial tachycaria, Dr Shirlee Latch .   15. false + ANA during evaluation of facial Rosacea Hemorrhoids  Physical Exam  General:  Well-developed,well-nourished,in no acute distress; alert,appropriate and cooperative throughout examination Eyes:  No corneal or conjunctival inflammation noted. EOMI. Perrla. Field of  Vision grossly normal.No nystagmus Ears:  External ear exam shows no significant lesions or deformities.  Otoscopic examination reveals clear canals, tympanic membranes are intact bilaterally without bulging, retraction, inflammation or discharge. Hearing is grossly normal bilaterally.Rinne  (BC>AC) and Weber normal.   Nose:  External nasal examination shows no deformity or inflammation. Nasal mucosa are pink and moist without lesions or exudates. Mouth:  Oral mucosa and oropharynx without lesions or exudates.  Teeth in good repair. Tongue not deviated Heart:  Normal rate and regular rhythm. S1 and S2 normal without gallop, murmur, click, rub .S4 with slurring  Pulses:  R and L carotid  pulses are full and equal  bilaterally; no bruits Neurologic:  alert & oriented X3, cranial nerves II-XII intact, strength normal in all extremities, sensation intact to light touch, gait normal, DTRs symmetrical and normal, finger-to-nose normal, and Romberg negative.   Skin:  Intact without suspicious lesions or rashes Cervical Nodes:  No lymphadenopathy noted Axillary Nodes:  No palpable lymphadenopathy Psych:  memory intact for recent and remote, normally interactive, and good eye contact.     Impression & Recommendations:  Problem # 1:  BENIGN POSITIONAL VERTIGO (ICD-386.11)  Problem # 2:  HYPERLIPIDEMIA-MIXED (ICD-272.4) improved but not @ goal  Problem # 3:  THYROID FUNCTION TEST, ABNORMAL (ICD-794.5)  TSH 5.79  Orders: Venipuncture (11914) TLB-TSH (Thyroid Stimulating Hormone) (84443-TSH) TLB-T3, Free (Triiodothyronine) (84481-T3FREE) TLB-T4 (Thyrox), Free [phone])  Complete Medication List: 1)  Aspir-low 81 Mg Tbec (Aspirin) .... Take 1 tablet by mouth once a day 2)  Multivitamins Tabs (Multiple vitamin) .... Take 1 tablet by mouth once a day 3)  Flax Oil (Flaxseed (linseed)) .... Take 1 tablet by mouth once a day 4)  Diazepam 2 Mg Tabs (Diazepam) .Marland Kitchen.. 1-2 every 8 hrs as needed for bpv  Patient Instructions: 1)  Go to Web MD for BPV. Check A1c & CRP annually. Prescriptions: DIAZEPAM 2 MG TABS (DIAZEPAM) 1-2 every 8 hrs as needed for BPV  #30 x 0   Entered and Authorized by:   Marga Melnick MD   Signed by:   Marga Melnick MD on 03/03/2010   Method used:   Print then Give to Patient   RxID:   [id number]    Orders Added: 1)  Est. Patient Level IV [13244] 2)  Venipuncture [01027] 3)  TLB-TSH (Thyroid Stimulating Hormone) [84443-TSH] 4)  TLB-T3, Free (Triiodothyronine) [25366-Y4IHKV] 5)  TLB-T4 (Thyrox), Free [42595-GL8V]  Appended Document: dizzy,cbs

## 2010-03-10 ENCOUNTER — Other Ambulatory Visit: Payer: Self-pay | Admitting: Internal Medicine

## 2010-03-24 LAB — POCT CARDIAC MARKERS
CKMB, poc: <1 ng/mL — ABNORMAL LOW (ref 1.0–8.0)
CKMB, poc: <1 ng/mL — ABNORMAL LOW (ref 1.0–8.0)
Myoglobin, poc: 68.5 ng/mL (ref 12–200)
Myoglobin, poc: 79.3 ng/mL (ref 12–200)
Troponin i, poc: <0.05 ng/mL (ref 0.00–0.09)
Troponin i, poc: <0.05 ng/mL (ref 0.00–0.09)

## 2010-03-25 ENCOUNTER — Encounter: Payer: Self-pay | Admitting: Internal Medicine

## 2010-05-24 NOTE — Consult Note (Signed)
NAMEKRYSTINE, [name] NO.:  [id number]   MEDICAL RECORD NO.:  [id number]          PATIENT TYPE:  OUT   LOCATION:  GYN                          FACILITY:  [facility]   PHYSICIAN:  De Blanch, M.D.DATE OF BIRTH:  23-Dec-1955   DATE OF CONSULTATION:  06/05/2008  DATE OF DISCHARGE:                                 CONSULTATION   CHIEF COMPLAINT:  Endometrial cancer.   INTERVAL HISTORY:  The patient returns today for continuing follow up.  She was last seen here in February 2008.  Since that time, she has done  well.  She denies any GI or GU symptoms.  Has no pelvic pain, pressure,  vaginal bleeding or discharge.  Functional status is excellent.   She has a stressful family life, and her husband has had multiple  complications following liver transplant and bowel perforation.  Also,  the patient has been caring for elderly mother who recently died.   HISTORY OF PRESENT ILLNESS:  January 2006, stage Ia grade 1 endometrial  cancer undergoing a TAH BSO.  She did not receive any adjuvant therapy,  has been followed since that time with no evidence of recurrent disease.   PAST MEDICAL HISTORY/MEDICAL ILLNESSES:  Morbid obesity.   PAST SURGICAL HISTORY:  1. Cholecystectomy.  2. Myomectomy.  3. Tonsils and adenoidectomy.  4. TAH BSO.   ALLERGIES:  VIOXX.   CURRENT MEDICATIONS:  None.   FAMILY HISTORY:  Grandmother with colon cancer.   SOCIAL HISTORY:  The patient does not smoke.  She is married.   REVIEW OF SYSTEMS:  A 10-point comprehensive review of systems negative  except as noted above.   PHYSICAL EXAMINATION:  VITAL SIGNS:  Weight 279 pounds (up 27 pounds  since February 2008), blood pressure 130/80.  GENERAL:  The patient is an obese, pleasant white female in no acute  distress.  HEENT:  Negative.  NECK:  Supple without thyromegaly.  There is no supraclavicular or  inguinal adenopathy.  ABDOMEN:  Soft, nontender.  No mass, organomegaly, ascites  or hernias  noted except for an umbilical hernia which is asymptomatic.  PELVIC:  EG/BUN, vagina, bladder, urethra are normal.  Cervix and uterus  surgically absent.  Adnexa without masses.  Rectovaginal exam confirms.  EXTREMITIES:  Lower extremities 1+ ankle edema.   IMPRESSION:  Stage Ia, grade 1 endometrial cancer January 2006.  No  evidence of recurrent disease.  The patient will return to see [place] in six  months.  Pap smears were obtained today.      De Blanch, M.D.  Electronically Signed     DC/MEDQ  D:  06/05/2008  T:  06/05/2008  Job:  [id number]   cc:   Telford Nab, R.N.  [address]  [place], [place] [address]

## 2010-05-27 NOTE — Consult Note (Signed)
NAMEJAQULYN, CHANCELLOR NO.:  [id number]   MEDICAL RECORD NO.:  [id number]          PATIENT TYPE:  OUT   LOCATION:  GYN                          FACILITY:  [facility]   PHYSICIAN:  De Blanch, M.D.DATE OF BIRTH:  08-07-1955   DATE OF CONSULTATION:  02/17/2005  DATE OF DISCHARGE:  02/17/2005                                   CONSULTATION   CHIEF COMPLAINT:  Endometrial cancer.   INTERVAL HISTORY:  Since her last visit, the patient has done reasonably  well. Her only complaint is that of some vulvar pruritus and minimal amount  of spotting after intercourse. She does note she has a significant amount of  vaginal dryness.   HISTORY OF PRESENT ILLNESS:  The patient underwent TAH/BSO for grade 1  endometrial cancer January 2006. Final pathology showed no residual disease  (stage IA grade 1). She had an uncomplicated postoperative course.   PAST MEDICAL HISTORY:  Medical illnesses - obesity.   PAST SURGICAL HISTORY:  Cholecystectomy, myomectomy, tonsils and  adenoidectomy, TAH/BSO.   ALLERGIES:  VIOXX.   CURRENT MEDICATIONS:  None.   FAMILY HISTORY:  Grandmother with colon cancer.   SOCIAL HISTORY:  The patient is married without children. She completed a  graduate school and is self employed as a IT trainer. The patient lives with her  husband who has had a liver transplantation on 2 occasions. She does not  smoke or use alcohol.   REVIEW OF SYSTEMS:  A 10-point comprehensive review of systems is performed  and is negative except as noted above.   PHYSICAL EXAMINATION:  VITAL SIGNS:  Weight 265 pounds.  GENERAL:  The patient is an obese white female in no acute distress.  HEENT:  Negative.  NECK:  Supple without thyromegaly. There was no supraclavicular or inguinal  adenopathy.  ABDOMEN:  Soft, nontender, no mass, organomegaly, ascites or hernias are  noted. Her incision is well healed.  PELVIC:  EGBUS, vagina, bladder, urethra are normal except for some  excoriation in the posterior introitus with a fissure in the skin. The  vagina is atrophic, well supported, no lesions are noted. Pap smears are  obtained. Bimanual and rectovaginal exam reveal no masses, induration or  nodularity.   IMPRESSION:  Stage IA grade 1 endometrial cancer doing well, no evidence of  recurrent disease. Pap smears are obtained today.   Vaginal atrophy most likely causing vaginal spotting. The patient's given a  prescription for Premarin vaginal cream to be used 1/2 gram 3 times a week.  She will also use Astroglide for vaginal lubrication. She will use 1%  cortisone ointment on her vulva to minimize scratching. She will contact us  if she has any difficulties with any of these prescriptions otherwise she  will return to see [place] in 3 months for continuing followup.      De Blanch, M.D.  Electronically Signed     DC/MEDQ  D:  02/17/2005  T:  02/20/2005  Job:  [id number]   cc:   Telford Nab, R.N.  [address]  [place], [place] [address]

## 2010-05-27 NOTE — H&P (Signed)
NAMEMICCA, MATURA NO.:  [id number]   MEDICAL RECORD NO.:  [id number]          PATIENT TYPE:  INP   LOCATION:  6704                         FACILITY:  [facility]   PHYSICIAN:  Titus Dubin. Alwyn Ren, M.D. [facility] OF BIRTH:  08/03/1955   DATE OF ADMISSION:  08/24/2004  DATE OF DISCHARGE:                                HISTORY & PHYSICAL   [name] is a 55 year old white female admitted with rectal bleeding in  the setting of obstipation and abdominal pain.   On the evening of August 14 she felt some discomfort in her lower abdomen  after the evening meal.  The symptoms seemed to resolve by the morning of  August 15 but after lunch on the 15th she had urgency for stool.  This was  associated with cramping.  She was traveling and had to stop in a fast food  restaurant.  She was in the bathroom for approximately an hour with no bowel  movement.  During this time she had profuse diaphoresis and nausea and  vomiting.  She resumed the travels only to have to stop for nausea and  vomiting a second episode.   Following this she has noted stool which is soft and scant filled with frank  blood.  The morning of admission she was having cramps even with liquids and  was unable to take p.o.'s.   Significantly, in January of this year she had a colonoscopy which was done  preoperatively to extensive gynecologic surgery.  That revealed diverticula  and polyps.  Also, of possible significance is the fact that her father had  a colostomy related to diverticulitis.   PAST MEDICAL HISTORY:  1.  CMV pneumonia in 1994.  2.  She has had tonsillectomy.  3.  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  4.  In 1980 she had a fibroidectomy.  5.  She has had ovarian cysts removed.  6.  She is gravida 0, para 0.  7.  She had a total abdominal hysterectomy and bilateral salpingo-      oophorectomy for endometrial cancer.  There was no  residual cancer      (stage IA, grade 1) and she had an uncomplicated postoperative course.  8.  She has had a positive ANA which was done to evaluate a facial rash      which was diagnosed as rosacea.   FAMILY HISTORY:  Asthma, liver transplant, diabetes in her father.  He also  had colon polyps.  Maternal grandfather had prostate cancer.  Maternal  grandmother had stroke.  Paternal grandfather had sudden death.  Mother has  an essential tremor, osteoporosis, and deep venous thrombosis.  Maternal  grandmother had pulmonary thromboemboli.   She has never smoked, does not drink.  She is on no medications.  VIOXX did  cause swelling.   REVIEW OF SYSTEMS:  Negative for cardiopulmonary symptomatology and  genitourinary symptomatology.  She did have chills the evening of August 15.   PHYSICAL EXAMINATION:  VITAL SIGNS:  Weight 269,  pulse 80, blood pressure  102/58.  HEENT:  Tongue is coated.  Remainder of otolaryngologic examination is  unremarkable.  Fundi reveal normal vasculature.  CHEST:  Clear to auscultation.  HEART:  She has a regular rhythm.  ABDOMEN:  Bowel sounds are decreased.  She is tender in the left lower  quadrant.  There is no lymphadenopathy or organomegaly.  RECTAL:  Essentially empty vault.  Scant stool, appeared slightly pink and  was strongly hemoccult-positive.   She will be admitted with presumed diverticular bleed.  GI consultation will  be pursued.  At this time there is no evidence of a bleeding dyscrasias.  She denies epistaxis, hematuria, excessive bruising, or abnormal clotting.      Titus Dubin. Alwyn Ren, M.D. [facility]  Electronically Signed     [facility]/MEDQ  D:  08/25/2004  T:  08/25/2004  Job:  [id number]   cc:   Leatha Gilding. Mezer, M.D.  [address]  [place]  [place] [address]  Fax: [phone]   Wilhemina Bonito. Marina Goodell, M.D. [facility]  [address]  [place]  [place] [address]

## 2010-05-27 NOTE — Consult Note (Signed)
**Note Crystal-Identified via Obfuscation** NAMEPORCHA, [name] NO.:  [id number]   MEDICAL RECORD NO.:  [id number]          PATIENT TYPE:  OUT   LOCATION:  GYN                          FACILITY:  [facility]   PHYSICIAN:  Crystal Blanch, M.D.DATE OF BIRTH:  08/30/1955   DATE OF CONSULTATION:  02/27/2006  DATE OF DISCHARGE:                                 CONSULTATION   CHIEF COMPLAINT:  Endometrial cancer.   INTERVAL HISTORY:  Since her last visit, the patient has done well.  She  denies any GI or GU symptoms, has no pelvic pain, pressure, vaginal  bleeding or discharge.  Functional status has been excellent.   HISTORY OF PRESENT ILLNESS:  Stage IA grade 1 endometrial cancer,  undergoing initial surgery January 2006.  She did not require any  adjuvant postoperative therapy.   PAST MEDICAL HISTORY:   MEDICAL ILLNESSES:  Morbid obesity.   PAST SURGICAL HISTORY:  1. Cholecystectomy.  2. Myomectomy.  3. Tonsillectomy and adenoidectomy.  4. TAH/BSO.   DRUG ALLERGIES:  VIOXX.   CURRENT MEDICATIONS:  None.   FAMILY HISTORY:  Grandmother with colon cancer.  The patient's husband  has had 2 liver transplants at [facility].  He was recently admitted with an  intestinal obstruction and perforation with sepsis.  Subsequently,  because of adjusting his antirejection drugs, he started to reject his  liver, although apparently this is resolving with further medical  management.   SOCIAL HISTORY:  The patient does not smoke.   REVIEW OF SYSTEMS:  Ten-point comprehensive review of systems is  negative except as noted above.   PHYSICAL EXAMINATION:  VITAL SIGNS:  Weight 252 pounds (down 9 pounds  from last visit).  Blood pressure 140/82, pulse 70, respiratory rate 20.  GENERAL:  The patient is a healthy white female in no acute distress.  HEENT:  Negative.  NECK:  Supple without thyromegaly.  LYMPHATICS:  There is no supraclavicular or inguinal adenopathy.  ABDOMEN:  Soft and nontender.  No mass,  organomegaly, ascites or hernias  are noted.  PELVIC:  EG/BUS, vagina, bladder and urethra were normal.  Cervix and  uterus are surgically absent.  Adnexa without masses.  Rectovaginal exam confirms.  LOWER EXTREMITIES:  Without edema or varicosities.   IMPRESSION:  Stage IA, grade 1 endometrial cancer, no evidence of  recurrent disease, now with 2 years of followup.   PLAN:  Pap smears were obtained.  The patient will return to see [place] in 6  months for continuing followup.      Crystal Blanch, M.D.  Electronically Signed     DC/MEDQ  D:  02/27/2006  T:  02/28/2006  Job:  [id number]   cc:   Telford Nab, R.N.  [address]  [place], [place] [address]

## 2010-05-27 NOTE — Consult Note (Signed)
NAMEMORAYMA, GODOWN NO.:  [id number]   MEDICAL RECORD NO.:  [id number]          PATIENT TYPE:  OUT   LOCATION:  GYN                          FACILITY:  [facility]   PHYSICIAN:  De Blanch, M.D.DATE OF BIRTH:  [birth year]   DATE OF CONSULTATION:  09/22/2005  DATE OF DISCHARGE:                                   CONSULTATION   CHIEF COMPLAINT:  Endometrial cancer, vaginal dryness, vulvar pruritus.   INTERVAL HISTORY:  Since her last visit, the patient has done well.  She has  had no really new symptoms, but does note vaginal dryness and pruritus which  she complained of at last visit.  At the last visit, she was given a  prescription for Premarin cream and urged to get 1% cortisone, but she is  not done neither.   She denies any vaginal bleeding.  She has no pelvic pain, pressure, GI or GU  symptoms.  Her functional status is excellent.   HISTORY OF PRESENT ILLNESS:  Stage IA grade 1 endometrial cancer, undergoing  initial surgery January 2006.  She had an uncomplicated postoperative course  and did not require postoperative adjuvant therapy.   PAST MEDICAL HISTORY/MEDICAL ILLNESSES:  Morbid obesity.   PAST SURGICAL HISTORY:  Cholecystectomy, myomectomy, tonsils and  adenoidectomy, TAH-BSO.   DRUG ALLERGIES:  VIOXX.   CURRENT MEDICATIONS:  None.   FAMILY HISTORY:  Grandmother with colon cancer.  The patient's husband has  been in [facility] with a bowel obstruction for approximately 6 weeks.   SOCIAL HISTORY:  The patient is married and is without children.  She is  self-employed as a IT trainer.  Her husband has had a two liver transplants.  She  does not smoke.   REVIEW OF SYSTEMS:  A 10-point comprehensive review of systems is negative  except as noted above.   PHYSICAL EXAM:  VITAL SIGNS:  Weight 263 pounds, blood pressure 148/82,  pulse 80, respiratory rate 20.  GENERAL:  The patient is an obese white female in no acute distress.  HEENT is  negative.  NECK:  Supple without thyromegaly.  There is no supraclavicular or inguinal  adenopathy.  ABDOMEN:  Soft, obese, nontender.  No mass, organomegaly, ascites or hernias  noted.  Her incision is well-healed.  PELVIC:  EGBUS, vagina, bladder and urethra are normal.  Cervix and uterus  are surgically absent.  Cuff is well-healed, well-supported.  No lesions and  noted.  Bimanual and rectovaginal exam revealed no masses, induration or  nodularity.  LOWER EXTREMITIES:  Without edema or varicosities.   IMPRESSION:  Stage IA grade 1 endometrial cancer, doing well.  No evidence  recurrent disease.  Pap smears were obtained.  The patient return to see me  in 4 months for continuing surveillance.      De Blanch, M.D.  Electronically Signed     DC/MEDQ  D:  09/22/2005  T:  09/24/2005  Job:  [id number]   cc:   Telford Nab, R.N.  [address]  [place], [place] [address]

## 2010-05-27 NOTE — Consult Note (Signed)
NAMESYMONE, CORNMAN NO.:  [id number]   MEDICAL RECORD NO.:  [id number]          PATIENT TYPE:  OUT   LOCATION:  GYN                          FACILITY:  [facility]   PHYSICIAN:  De Blanch, M.D.DATE OF BIRTH:  06/11/1955   DATE OF CONSULTATION:  12/29/2003  DATE OF DISCHARGE:                                   CONSULTATION   A 55 year old white female seen in consultation at the request of Dr. Teodora Medici regarding management of grade 1 endometrial carcinoma. The patient was  found to have a thickened endometrial stripe and underwent endometrial  curettings and polypectomy on December 18, 2003. 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  She has had an uncomplicated postoperative course.   PAST MEDICAL HISTORY:  Medical illnesses none.   PAST SURGICAL HISTORY:  1.  Myomectomy.  2.  Laparoscopy with findings of adhesions. Interestingly, the patient      claims that her ovarian function stopped after that laparoscopic      procedure.  The operative note is not available nor is the pathology      available for our review.  3.  Laparoscopic cholecystectomy.   ALLERGIES:  Vioxx.   CURRENT MEDICATIONS:  None.   OBSTETRICAL HISTORY:  Gravida 0.   SOCIAL HISTORY:  The patient is married, she does not smoke, she works as a  Scientist, water quality.   FAMILY HISTORY:  Negative for gynecologic, breast or colon cancer.   REVIEW OF SYMPTOMS:  Negative except as noted above.   PHYSICAL EXAMINATION:  VITAL SIGNS:  Height 5 foot 6, weight 266 pounds,  blood pressure 118/82.  Pulse 72, respiratory rate 18.  GENERAL:  The patient is an obese, pleasant, white female in no acute  distress.  HEENT:  Negative.  NECK:  Supple without thyromegaly. There was no supraclavicular or inguinal  adenopathy.  ABDOMEN:  Obese, soft, nontender, no mass, organomegaly, ascites or hernias  are noted.  She  has an umbilical scar which is well healed.  PELVIC:  EGBUS, vagina, bladder, urethra are normal. Cervix appears normal.  The uterus is difficult to outline because of the patient's obesity but I  believe it is essentially normal size.   IMPRESSION:  Clinical stage 1 grade 1 endometrial carcinoma.   I had a lengthy discussion with the patient, her husband, her mother and her  sister regarding management of this malignancy. I have recommended that she  undergo a total abdominal hysterectomy, bilateral salpingo-oophorectomy and  possible more extended surgical staging depending upon of depth of invasion.  The risks of surgery were all outlined.  I indicated I do not believe this  surgery can be done vaginally given her prior surgery with adhesions.  We  will coordinate surgery with Dr. Chevis Pretty.  The risks of surgery have been  outlined and all of their questions are answered.     Dani   DC/MEDQ  D:  12/29/2003  T:  12/29/2003  Job:  [id number]   cc:   Leatha Gilding. Mezer, M.D.  [address]  [place]  [place] [address]  Fax: [phone]   Titus Dubin. Alwyn Ren, M.D. [facility]   Telford Nab, R.N.  [address]  [place], [place] [address]

## 2010-05-27 NOTE — Op Note (Signed)
[name], [name] NO.:  [id number]   MEDICAL RECORD NO.:  [id number]          PATIENT TYPE:  INP   LOCATION:  0009                         FACILITY:  [facility]   PHYSICIAN:  De Blanch, M.D.DATE OF BIRTH:  11-Jun-1955   DATE OF PROCEDURE:  02/09/2004  DATE OF DISCHARGE:                                 OPERATIVE REPORT   PREOPERATIVE DIAGNOSIS:  Grade 1 endometrial adenocarcinoma.   POSTOPERATIVE DIAGNOSIS:  Grade 1 endometrial adenocarcinoma.   PROCEDURE:  Exploratory laparotomy, lysis of extensive adhesions, total  abdominal hysterectomy and bilateral salpingo-oophorectomy.   SURGEON:  De Blanch, M.D.   ASSISTANT:  Leatha Gilding. Mezer, M.D., Telford Nab, R.N.   ANESTHESIA:  General with orotracheal tube.   ESTIMATED BLOOD LOSS:  200 cc.   FINDINGS:  At the time of exploratory laparotomy, the patient had extensive  adhesions nearly completely obliterating her gynecologic pelvic structures.  The sigmoid colon was densely adherent to the pelvic sidewalls as well as  the posterior aspect of the uterus.  The bladder flap was folded over itself  and adherent to the fundus of the uterus.  The tubes and ovaries on the left  side were densely adherent to the pelvic sidewall and the mesentery of the  sigmoid colon.  The upper abdomen, including liver, spleen, stomach, small  and large bowel, and omentum were normal.  There was no enlarged pelvic or  periaortic lymph nodes.   DESCRIPTION OF PROCEDURE:  The patient was brought to the operating room and  while she was awake was positioned in [place] stirrups, with care taken to  avoid strain on her leg or back.  She then underwent general anesthesia.  The anterior abdominal wall, perineum, and vagina were prepped with  Betadine.  A Foley catheter was inserted, and the abdomen was draped.   The lower abdomen was incised with a midline incision and the peritoneal  cavity opened.  Peritoneal  washings were obtained.  The upper abdomen was  explored, with the above-noted findings.  A Bookwalter retractor was  positioned and the bowel packed out of the pelvis.  Careful tedious  dissection was performed, lysing adhesions of the sigmoid colon from the  uterus and the pelvic sidewall.  The left round ligament was divided and the  retroperitoneal space opened, identifying the vessels and ureter.  The  ovarian vessels were skeletonized, clamped, cut, free-tied, and suture  ligated using 2-0 Vicryl.  The ovary was then sharply dissected from the  mesentery of the sigmoid colon, with care taken to avoid injury to the  bowel.   Additional adhesions between the back of the uterus to the fundus were lysed  with sharp dissection.  The left round ligament was divided and the  retroperitoneal space opened, identifying the vessels and ureter.  The  ovarian vessels on the right were then clamped, cut, free-tied, and suture  ligated.  The posterior leaf of the broad ligament was then incised,  elevating the tube and ovary.  The uterus was then grasped with a long Kelly  clamp across the cornua.  The bladder  flap was then developed using sharp  and blunt dissection.  The uterine vessels on the right were then clamped,  cut, and suture ligated using 2-0 Vicryl.  The left side was further  dissected away from the sigmoid colon until the uterine vessels were  exposed.  These were then clamped, cut, and suture ligated in stepwise  fashion.  The paracervical and cardinal ligaments were clamped, cut, and  suture ligated.  The bladder continued to be advanced with sharp dissection.  The vaginal angles were then encountered after lysing additional adhesions  from the posterior cul-de-sac and sigmoid colon.  The vagina angle was then  crossclamped and divided, and vagina was transected from its junction at the  cervix.  The vaginal angles were then transfixed with 0 Vicryl suture and  the central portion  of the vagina closed with interrupted figure-of-eight  sutures of 0 Vicryl.  The pelvis was irrigated and hemostasis ascertained.   The packs and retractors were removed after we were informed by frozen  section that there was no evidence of myometrial invasion.  The abdomen was  then closed in layers, the first being a running mass closure using #1 PDS,  incorporating the fascia and rectus muscle.  The subcutaneous tissue was  irrigated.  Hemostasis was achieved with cautery.  The subcutaneous tissue  was reapproximated with 3-0 Vicryl suture.  Skin staples were applied to  approximate the skin.  A dressing was applied.   The patient was awakened from anesthesia and taken to the recovery room in  satisfactory condition.  Sponge, needle, and instrument counts were correct  x2.      DC/MEDQ  D:  02/09/2004  T:  02/09/2004  Job:  [id number]   cc:   Leatha Gilding. Mezer, M.D.  [address]  [place]  [place] [address]  Fax: [phone]   Telford Nab, R.N.  [phone]  [place], [place] [address]

## 2010-05-27 NOTE — Discharge Summary (Signed)
[name], DADO                ACCOUNT NO.:  [id number]   MEDICAL RECORD NO.:  [id number]          PATIENT TYPE:  INP   LOCATION:  6704                         FACILITY:  [facility]   PHYSICIAN:  Georgina Quint. Plotnikov, M.D. LHCDATE OF BIRTH:  [birth year]   DATE OF ADMISSION:  08/24/2004  DATE OF DISCHARGE:  08/27/2004                                 DISCHARGE SUMMARY   DISCHARGE MEDICATIONS:  1.  Phenergan 25 mg four times a day as needed for nausea.  2.  Lomotil one to two four times a day as needed for diarrhea.  3.  K-Dur 20 mEq p.o. daily for one week.   DIET:  Avoid spicy, greasy foods for one week.   FOLLOW-UP PLANS:  Dr. Alwyn Ren in one week.   SPECIAL INSTRUCTIONS:  Call if problems.   DISCHARGE DIAGNOSES:  1.  Left-sided colitis, infectious versus ischemic.  2.  Dehydration.  3.  Hematochezia due to problem #1.  4.  Hypokalemia.   HISTORY:  The patient is a 55 year old female who was admitted for rectal  bleeding and abdominal pain on August 24, 2004.  She had nausea, vomiting  and then soft stool with blood.  For the details, please address Dr.  Frederik Pear history and physical on August 24, 2004.   HOSPITAL COURSE:  During the course of hospitalization, the patient was  treated with IV fluids, IV antibiotics and potassium.  She was seen in  consultation by Dr. Christella Hartigan with gastroenterology, underwent colonoscopy  which was suspicious for mild ischemic colitis on the left.  As a result,  she had  CT scan with angiogram that was suspicious for small-vessel  disease; however, no major blockage was noted.  Colitis in the left colon  was noted again.   PHYSICAL EXAMINATION:  GENERAL:  On the day of discharge, she was feeling  well, no abdominal pain, nausea, vomiting or diarrhea, no blood in the  stools.  VITAL SIGNS:  Temperature 98.0, heart rate 74, respirations 20, blood  pressure 126/67.  Saturation 94% on room air.  She had soft stool yesterday  without blood.  She was  in no acute distress.  HEENT:  Moist.  CHEST:  Lungs clear.  CARDIAC:  Regular.  ABDOMEN:  Soft, nontender.  No organomegaly, no mass felt.  EXTREMITIES:  Lower extremities without edema.   LABORATORY DATA:  On the day of discharge, white count 5.9, hemoglobin 30.9.  Potassium 3.4, creatinine 1.9.           ______________________________  Georgina Quint. Plotnikov, M.D. [facility]     AVP/MEDQ  D:  08/27/2004  T:  08/27/2004  Job:  [id number]   cc:   Titus Dubin. Alwyn Ren, M.D. [facility]  [phone] [place]  [place] [address]

## 2010-05-27 NOTE — Consult Note (Signed)
**Note Crystal-Identified via Obfuscation** [name], [name] NO.:  [id number]   MEDICAL RECORD NO.:  [id number]          PATIENT TYPE:  OUT   LOCATION:  GYN                          [facility]ACILITY:  [facility]   PHYSICIAN:  [name], M.D.DATE OF BIRTH:  12/09/1955   DATE OF CONSULTATION:  DATE OF DISCHARGE:                                   CONSULTATION   A 55 year old white female returns for six-week postoperative checkup.  On  January 30, 2004, she underwent a TAH/BSO for a grade I endometrial cancer.  Final pathology showed no residual carcinoma (stage IA, grade I).  The  patient had an uncomplicated postoperative course.   PHYSICAL EXAMINATION:  VITAL SIGNS:  Weight 270 pounds.  Blood pressure  150/92.  GENERAL:  The patient is a pleasant, obese white female in no acute  distress.  HEENT:  Negative.  NECK:  Supple without thyromegaly.  There is no supraclavicular or inguinal  adenopathy.  ABDOMEN:  Obese, soft and nontender.  No mass, organomegaly, ascites, or  hernias are noted.  Midline incision is well healed.  PELVIC:  EG/BUS, vagina, bladder, and urethra are normal.  The vaginal cuff  is healing well.  There is a small amount of granulation tissue which we  touched with silver nitrate.  Bimanual and rectovaginal exam reveal minimal  postoperative induration.  No masses are noted.   IMPRESSION:  Stage IA, grade I endometrial adenocarcinoma with no residual  disease on the hysterectomy specimen.   I do not believe any additional therapy is warranted and would recommend  this patient be seen by Dr. Chevis Beltran for surveillance in six months.  We will  plan on seeing her back again in one year.  She is given the okay to return  to full levels of activities.      DC/MEDQ  D:  03/29/2004  T:  03/29/2004  Job:  [id number]   cc:   [name], M.D.  [address]  [place]  [place] [address]  Fax: [phone]   [name], R.N.  [phone]  [place], [place] [address]   [name], M.D. [facility]

## 2010-08-19 ENCOUNTER — Encounter: Payer: Self-pay | Admitting: Internal Medicine

## 2010-08-19 ENCOUNTER — Ambulatory Visit: Payer: PRIVATE HEALTH INSURANCE | Admitting: Internal Medicine

## 2010-08-19 ENCOUNTER — Ambulatory Visit (INDEPENDENT_AMBULATORY_CARE_PROVIDER_SITE_OTHER): Payer: PRIVATE HEALTH INSURANCE | Admitting: Internal Medicine

## 2010-08-19 DIAGNOSIS — J069 Acute upper respiratory infection, unspecified: Secondary | ICD-10-CM

## 2010-08-19 DIAGNOSIS — J209 Acute bronchitis, unspecified: Secondary | ICD-10-CM

## 2010-08-19 LAB — CBC WITH DIFFERENTIAL/PLATELET
Basophils Relative: 0.6 % (ref 0.0–3.0)
Eosinophils Absolute: 0 10*3/uL (ref 0.0–0.7)
Lymphocytes Relative: 12.6 % (ref 12.0–46.0)
MCHC: 34.1 g/dL (ref 30.0–36.0)
Monocytes Relative: 8.7 % (ref 3.0–12.0)
Neutrophils Relative %: 78.1 % — ABNORMAL HIGH (ref 43.0–77.0)
RBC: 4.56 Mil/uL (ref 3.87–5.11)
WBC: 5.2 10*3/uL (ref 4.5–10.5)

## 2010-08-19 MED ORDER — HYDROCODONE-HOMATROPINE 5-1.5 MG/5ML PO SYRP: 5.0000 mL | ORAL_SOLUTION | Freq: Four times a day (QID) | ORAL | Status: AC | PRN

## 2010-08-19 MED ORDER — AZITHROMYCIN 250 MG PO TABS: ORAL_TABLET | ORAL | Status: AC

## 2010-08-19 NOTE — Patient Instructions (Signed)
Plain Mucinex for thick secretions ;force NON dairy fluids for next 48 hrs. Use a Neti pot daily as needed for sinus congestion. NSAIDS ( Aleve, Advil, Naproxen) or Tylenol every 4 hrs as needed for fever as discussed based on label recommendations  

## 2010-08-19 NOTE — Progress Notes (Signed)
  Subjective:    Patient ID: [name], female    DOB: 14-Jun-1955, 55 y.o.   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 Progression of symptoms:productive cough & head congestion Treatments/response:Tylenol Present symptoms:malaise Fever/chills/sweats:temp to 101.4 Frontal headache:yes Facial pain:yes Nasal purulence:no Sore throat:no Dental pain:yes Lymphadenopathy:no Wheezing/shortness of breath:no Cough/sputum/hemoptysis:light beige mucus Pleuritic pain:no Associated extrinsic/allergic symptoms:itchy eyes/ sneezing:no Past medical history: asthma:no but in father & sister Smoking history:never           Review of Systems     Objective:   Physical Exam General appearance is of no acute distress or increased work of breathing No  lymphadenopathy about the head, neck, or axilla noted.   Eyes: No conjunctival inflammation or lid edema is present. There is no scleral icterus.  Ears:  External ear exam shows no significant lesions or deformities.  Otoscopic examination reveals clear canals, tympanic membranes are intact bilaterally without bulging, retraction, inflammation or discharge. TMs slightly dull  Nose:  External nasal examination shows no deformity or inflammation. Nasal mucosa are pink and moist without lesions or exudates. No septal dislocation or dislocation.No obstruction to airflow.   Oral exam: Dental hygiene is good; lips and gums are healthy appearing.There is no oropharyngeal erythema or exudate noted.   Heart:  Rate 99 ,regular rhythm. S1 and S2 normal without  murmur, click, rub or other extra sounds.  Slight gallop  Lungs:Chest clear to auscultation; no wheezes, rhonchi,rales ,or rubs present.No increased work of breathing.    Extremities:  No cyanosis, edema, or clubbing  noted    Skin: Warm & dry w/o jaundice or  tenting.          Assessment & Plan:  #1 bronchitis, acute with fever and productive cough  #2 upper respiratory tract symptoms without nasal purulence   Plan: See orders and recommendations.

## 2010-08-22 ENCOUNTER — Telehealth: Payer: Self-pay | Admitting: Internal Medicine

## 2010-08-22 MED ORDER — PREDNISONE 20 MG PO TABS: ORAL_TABLET | ORAL | Status: DC

## 2010-08-22 MED ORDER — ALBUTEROL SULFATE HFA 108 (90 BASE) MCG/ACT IN AERS: 2.0000 | INHALATION_SPRAY | Freq: Four times a day (QID) | RESPIRATORY_TRACT | Status: DC | PRN

## 2010-08-22 NOTE — Telephone Encounter (Signed)
Pt called says she was supposed to have rx for prednisone and would also like to have an inhaler. Pls advise

## 2010-08-22 NOTE — Telephone Encounter (Signed)
Prednisone 20 mg 1/2 tid with food, #9. Albuterol MDI , 1-2  Puffs every 4-6 hrs prn #1

## 2010-08-22 NOTE — Telephone Encounter (Signed)
Spoke w/ pt aware medication called to pharmacy.  

## 2011-03-06 ENCOUNTER — Telehealth: Payer: Self-pay

## 2011-03-06 ENCOUNTER — Ambulatory Visit (INDEPENDENT_AMBULATORY_CARE_PROVIDER_SITE_OTHER): Payer: PRIVATE HEALTH INSURANCE | Admitting: Internal Medicine

## 2011-03-06 ENCOUNTER — Encounter: Payer: Self-pay | Admitting: Internal Medicine

## 2011-03-06 VITALS — BP 124/78 | HR 84 | Temp 99.1°F | Wt 284.0 lb

## 2011-03-06 DIAGNOSIS — J209 Acute bronchitis, unspecified: Secondary | ICD-10-CM

## 2011-03-06 MED ORDER — HYDROCODONE-HOMATROPINE 5-1.5 MG/5ML PO SYRP: 2.5000 mL | ORAL_SOLUTION | Freq: Four times a day (QID) | ORAL | Status: DC | PRN

## 2011-03-06 MED ORDER — FLUTICASONE-SALMETEROL 250-50 MCG/DOSE IN AEPB: 1.0000 | INHALATION_SPRAY | Freq: Two times a day (BID) | RESPIRATORY_TRACT | Status: DC

## 2011-03-06 MED ORDER — PREDNISONE 20 MG PO TABS: 20.0000 mg | ORAL_TABLET | Freq: Two times a day (BID) | ORAL | Status: AC

## 2011-03-06 MED ORDER — AZITHROMYCIN 250 MG PO TABS: ORAL_TABLET | ORAL | Status: AC

## 2011-03-06 NOTE — Telephone Encounter (Signed)
Patient was seen in office today, when reviewing patient's med list she stated she was using albuterol inhaler as needed, med was marked as taking. At the end of visit when copy of visit was given patient stated " I mis-understood question about if I was still using inhaler, I said as needed but its been over a year since I have had inhaler which would mean I'm not taking but I would like to know if I should be taking and if so I need a RX."  Dr.Hopper please advise

## 2011-03-06 NOTE — Progress Notes (Signed)
Addended by: Maurice Small on: 03/06/2011 04:37 PM   Modules accepted: Orders

## 2011-03-06 NOTE — Patient Instructions (Signed)
Advair one  inhalation every 12 hours; gargle and spit after use 

## 2011-03-06 NOTE — Progress Notes (Signed)
  Subjective:    Patient ID: [name], female    DOB: [birth year], 56 y.o.   MRN: [id number]  HPI Respiratory tract infection Onset/symptoms:2/22 as "gunky throat" Exposures (illness/environmental/extrinsic):husband ill Progression of symptoms:to chest as of 2/23 Treatments/response:none Present symptoms: Fever/chills/sweats:chills 2/23 eve Frontal headache:no Facial pain:no Nasal purulence:no Sore throat:no Dental pain:no Lymphadenopathy:no Wheezing/shortness of breath:terrible wheezing last night Cough/sputum/hemoptysis:scant creamy thick sputum Pleuritic pain:no Associated extrinsic/allergic symptoms:itchy eyes/ sneezing:no Past medical history: Seasonal allergies : no/asthma:RAD only with RTI Smoking history:never           Review of Systems She has history of some reactive airways findings, respiratory tract infections. 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  No  lymphadenopathy about the head, neck, or axilla noted.   Eyes: No conjunctival inflammation or lid edema is present. There is no scleral icterus.  Ears:  External ear exam shows no significant lesions or deformities.  Otoscopic examination reveals clear canals, tympanic membranes are intact bilaterally without bulging, retraction, inflammation or discharge.  Nose:  External nasal examination shows no deformity or inflammation. Nasal mucosa are dry without lesions or exudates. No septal dislocation or deviation.No obstruction to airflow.   Oral exam: Dental hygiene is good; lips and gums are healthy appearing.There is no oropharyngeal erythema or exudate noted.     Heart:  Normal rate and regular rhythm. S1 and S2 normal without gallop, murmur,  click, rub or other extra sounds.   Lungs: Coarse, musical rhonchi, rales, and wheezes in a diffuse, homogenous distribution. Dry nonproductive cough.No increased work of breathing.    Extremities:  No cyanosis, edema, or clubbing  noted    Skin: Warm & dry          Assessment & Plan:  #1 bronchitis, acute with bronchospasm  Plan: See orders and recommendations

## 2011-03-06 NOTE — Telephone Encounter (Signed)
Refill the ProAir if the reactive airway symptoms do not respond completely to prednisone and Advair.

## 2011-03-07 MED ORDER — ALBUTEROL SULFATE HFA 108 (90 BASE) MCG/ACT IN AERS: 2.0000 | INHALATION_SPRAY | Freq: Four times a day (QID) | RESPIRATORY_TRACT | Status: DC | PRN

## 2011-03-07 NOTE — Telephone Encounter (Signed)
Verified with patient her pharmacy and refilled the ProAir inhaler for her.  Patient notified.

## 2011-04-17 ENCOUNTER — Other Ambulatory Visit: Payer: Self-pay | Admitting: Gynecology

## 2011-04-17 ENCOUNTER — Encounter: Payer: Self-pay | Admitting: Gynecology

## 2011-04-17 ENCOUNTER — Other Ambulatory Visit: Payer: Self-pay | Admitting: Gynecologic Oncology

## 2011-04-17 ENCOUNTER — Other Ambulatory Visit (HOSPITAL_COMMUNITY)
Admission: RE | Admit: 2011-04-17 | Discharge: 2011-04-17 | Disposition: A | Discharge status: Home / Self Care | Payer: PRIVATE HEALTH INSURANCE | Source: Ambulatory Visit | Attending: Gynecology | Admitting: Gynecology

## 2011-04-17 ENCOUNTER — Ambulatory Visit: Payer: PRIVATE HEALTH INSURANCE | Attending: Gynecology | Admitting: Gynecology

## 2011-04-17 VITALS — BP 128/66 | HR 64 | Temp 98.1°F | Resp 14 | Ht 66.0 in | Wt 280.0 lb

## 2011-04-17 DIAGNOSIS — N6459 Other signs and symptoms in breast: Secondary | ICD-10-CM | POA: Insufficient documentation

## 2011-04-17 DIAGNOSIS — Z79899 Other long term (current) drug therapy: Secondary | ICD-10-CM | POA: Insufficient documentation

## 2011-04-17 DIAGNOSIS — N644 Mastodynia: Secondary | ICD-10-CM

## 2011-04-17 DIAGNOSIS — C549 Malignant neoplasm of corpus uteri, unspecified: Secondary | ICD-10-CM | POA: Insufficient documentation

## 2011-04-17 DIAGNOSIS — C541 Malignant neoplasm of endometrium: Secondary | ICD-10-CM

## 2011-04-17 DIAGNOSIS — Z01419 Encounter for gynecological examination (general) (routine) without abnormal findings: Secondary | ICD-10-CM | POA: Insufficient documentation

## 2011-04-17 DIAGNOSIS — E669 Obesity, unspecified: Secondary | ICD-10-CM | POA: Insufficient documentation

## 2011-04-17 DIAGNOSIS — Z9079 Acquired absence of other genital organ(s): Secondary | ICD-10-CM | POA: Insufficient documentation

## 2011-04-17 DIAGNOSIS — Z90710 Acquired absence of both cervix and uterus: Secondary | ICD-10-CM | POA: Insufficient documentation

## 2011-04-17 NOTE — Progress Notes (Signed)
Consult Note: Gyn-Onc   [name] 56 y.o. female  Chief Complaint  Patient presents with  . Endometrial cancer    Follow up    Interval History: The patient returns today for annual exam in followup of an endometrial cancer treated in 2006. Since her last visit in 2010 she's had no pelvic symptoms. She has had a flare of diverticulitis and is currently scheduled at colonoscopy in the near future. In addition she's had some pain in her right breast and feels her right breast is swelling some in that her breasts is tight her in her brought in and in the past. She denies any breast skin changes or nipple discharge. She has not found a mass in her breasts either. She has not had a mammogram in the last year.  HPI:  Stage IA grade 1 endometrial cancer undergoing surgery in January 2006. No adjuvant therapy was necessary. The patient's been followed since 2006 with no evidence recurrent disease  Allergies  Allergen Reactions  . Rofecoxib     Ankle edema    Past Medical History  Diagnosis Date  . 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 Pneumonia     CMV pneumonia 1994  . Colitis 08/2004  . Endometrial cancer     S/P Hysterectomy and BSO  . Obesity     Past Surgical History  Procedure Date  . Colonoscopy w/ polypectomy 01/26/2004    Dr.Perry  . Total abdominal hysterectomy w/ bilateral salpingoophorectomy 01/2004    For endometrial cancer   . Tonsillectomy   . Laparoscopy     for 3 ovarian cyst   . Cholecystectomy     Current Outpatient Prescriptions  Medication Sig Dispense Refill  . albuterol (PROVENTIL HFA;VENTOLIN HFA) 108 (90 BASE) MCG/ACT inhaler Inhale 2 puffs into the lungs every 6 (six) hours as needed for wheezing.  1 Inhaler  1  . aspirin 81 MG tablet Take 81 mg by mouth daily.        . Flaxseed, Linseed, (FLAX SEED OIL) 1000 MG CAPS Take 1,000 mg by mouth daily.        Marland Kitchen HYDROcodone-homatropine (HYDROMET) 5-1.5 MG/5ML syrup Take 2.5 mLs by mouth every 6 (six)  hours as needed. 1 teaspoon every 6 hours as needed  120 mL  0  . Multiple Vitamin (MULTIVITAMIN) tablet Take 1 tablet by mouth daily.        . vitamin C (ASCORBIC ACID) 500 MG tablet Take 500 mg by mouth daily.      . Fluticasone-Salmeterol (ADVAIR DISKUS) 250-50 MCG/DOSE AEPB Inhale 1 puff into the lungs 2 (two) times daily.  14 each  0    History   Social History  . Marital Status: Married    Spouse Name: N/A    Number of Children: N/A  . Years of Education: N/A   Occupational History  . Not on file.   Social History Main Topics  . Smoking status: Never Smoker   . Smokeless tobacco: Not on file  . Alcohol Use: No  . Drug Use: No  . Sexually Active: Not on file   Other Topics Concern  . Not on file   Social History Narrative  . No narrative on file    Family History  Problem Relation Age of Onset  . Heart attack Mother 7  . Stroke      Grandmother (? M or P)  . Heart failure      Grandmother (?M or P)  .  Sudden death Paternal Grandfather   . Atrial fibrillation Mother   . Atrial fibrillation      Grandmother (? M or P)  . Asthma Father   . Diabetes Father   . Liver disease Father     Liver transplant  . Diverticulitis Father   . Deep vein thrombosis Mother   . Lung cancer Mother   . Asthma Sister   . Prostate cancer Maternal Grandfather   . Heart attack Maternal Grandmother     Questionable  . Heart failure Maternal Grandmother     Questionable  . Deep vein thrombosis Maternal Grandmother   . Colon polyps Father   . Colon cancer Paternal Grandmother     Questionable  . Heart failure Paternal Grandfather     Review of Systems: 10 point review of systems negative except as noted above  Vitals: Blood pressure 128/66, pulse 64, temperature 98.1 F (36.7 C), temperature source Oral, resp. rate 14, height 5\' 6"  (1.676 m), weight 280 lb (127.007 kg).  Physical Exam: General the patient is an obese white female no acute distress  HEENT is  negative  Neck supple thyromegaly  There is no supraclavicular, axillary, or inguinal adenopathy.  Bilateral breast exam reveals no skin changes nipple discharge or masses.  The abdomen is obese soft nontender no masses organomegaly or ascites are noted  Pelvic exam EGBUS vagina bladder urethra are normal  Cervix and uterus is surgically absent  Adnexa without masses  Rectovaginal exam confirms  Extremities without edema or varicosities.    Assessment/Plan: Stage IA grade 1 endometrial cancer 2006 no evidence recurrent disease. Pap smears are obtained.  Right breast tenderness. We will request diagnostic mammograms at the breast center.  At this juncture with over 5 years of followup we'll release the patient from our continuing care.  CLARKE-PEARSON,Kashmir Lysaght L, MD 04/17/2011, 11:59 AM

## 2011-04-17 NOTE — Patient Instructions (Signed)
Schedule diagnostic mammograms and breast center in the near future.  We will contact you which are Pap smear report.

## 2011-05-01 ENCOUNTER — Ambulatory Visit (INDEPENDENT_AMBULATORY_CARE_PROVIDER_SITE_OTHER): Payer: PRIVATE HEALTH INSURANCE | Admitting: Internal Medicine

## 2011-05-01 ENCOUNTER — Encounter: Payer: Self-pay | Admitting: Internal Medicine

## 2011-05-01 VITALS — BP 122/82 | HR 78 | Ht 67.0 in | Wt 284.2 lb

## 2011-05-01 DIAGNOSIS — K625 Hemorrhage of anus and rectum: Secondary | ICD-10-CM

## 2011-05-01 DIAGNOSIS — R198 Other specified symptoms and signs involving the digestive system and abdomen: Secondary | ICD-10-CM

## 2011-05-01 DIAGNOSIS — Z86010 Personal history of colonic polyps: Secondary | ICD-10-CM

## 2011-05-01 DIAGNOSIS — R1032 Left lower quadrant pain: Secondary | ICD-10-CM

## 2011-05-01 MED ORDER — PEG-KCL-NACL-NASULF-NA ASC-C 100 G PO SOLR: 1.0000 | Freq: Once | ORAL | Status: DC

## 2011-05-01 NOTE — Progress Notes (Signed)
 HISTORY OF PRESENT ILLNESS:  [name] is a 56 y.o. female with morbid obesity, hyperlipidemia, early stage endometrial cancer status post hysterectomy and bilateral salpingo-oophorectomy, diverticulosis complicated by diverticulitis, and a history of ischemic colitis. She was last seen in the office January 2012 regarding lower abdominal discomfort and rectal bleeding. This after being treated for acute diverticulitis. The plan was for colonoscopy. The patient did not scheduled due to family issues. She did well until the past few months when she has developed pressure-like discomfort in the left lower quadrant. Somewhat similar to diverticular symptoms, though not as severe. Some nausea but no vomiting or fevers. She does report change in stool caliber, more narrow. No bleeding or mucus. This week she has noticed that some stools tend to float. No weight loss.  REVIEW OF SYSTEMS:  All non-GI ROS negative   Past Medical History  Diagnosis Date  . 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 Pneumonia     CMV pneumonia 1994  . Colitis 08/2004  . Endometrial cancer     S/P Hysterectomy and BSO  . Obesity   . Clotting disorder   . Allergic rhinitis   . Colon polyps   . Diverticulosis   . Hemorrhoids   . Palpitations     Past Surgical History  Procedure Date  . Colonoscopy w/ polypectomy 01/26/2004    Dr.  . Total abdominal hysterectomy w/ bilateral salpingoophorectomy 01/2004    For endometrial cancer   . Tonsillectomy   . Laparoscopy     for 3 ovarian cyst   . Cholecystectomy   . Fibroidectomy     Social History [name]  reports that she has never smoked. She has never used smokeless tobacco. She reports that she does not drink alcohol or use illicit drugs.  family history includes Asthma in her father and sister; Atrial fibrillation in her mother and unspecified family member; Colon cancer in her paternal grandmother; Colon polyps in her father; Deep vein thrombosis  in her maternal grandmother and mother; Diabetes in her father; Diverticulitis in her father; Heart attack in her maternal grandmother; Heart attack (age of onset:50) in her mother; Heart failure in her maternal grandmother, paternal grandfather, and unspecified family member; Liver disease in her father; Lung cancer in her mother; Prostate cancer in her maternal grandfather; Stroke in an unspecified family member; and Sudden death in her paternal grandfather.  Allergies  Allergen Reactions  . Rofecoxib     Ankle edema       PHYSICAL EXAMINATION: Vital signs: BP 122/82  Pulse 78  Ht 5\' 7"  (1.702 m)  Wt 284 lb 3.2 oz (128.912 kg)  BMI 44.51 kg/m2  SpO2 97%  Constitutional: obese,generally well-appearing, no acute distress Psychiatric: alert and oriented x3, cooperative Eyes: extraocular movements intact, anicteric, conjunctiva pink Mouth: oral pharynx moist, no lesions Neck: supple no lymphadenopathy Cardiovascular: heart regular rate and rhythm, no murmur Lungs: clear to auscultation bilaterally Abdomen: soft,obese, mild left lower quadrant tenderness without rebound, nondistended, no obvious ascites, no peritoneal signs, normal bowel sounds, no organomegaly Rectal:deferred until colonoscopy Extremities: no lower extremity edema bilaterally Skin: no lesions on visible extremities Neuro: No focal deficits.   ASSESSMENT:  #1. Several month history of vague left lower quadrant discomfort, and change in bowel habits. Rule out smoldering diverticulitis #2. History of diverticulitis #3. History of rectal bleeding #4. Colonoscopy January 2006 with non-adenomatous polyp. Colonoscopy August 2006 with mild ischemic change.   PLAN:  #1. CT  scan of the abdomen and pelvis with contrast #2. If CT negative for complicated diverticulitis, then proceed with colonoscopy.The nature of the procedure, as well as the risks, benefits, and alternatives were carefully and thoroughly reviewed with the  patient. Ample time for discussion and questions allowed. The patient understood, was satisfied, and agreed to proceed. Movi prep prescribed. The patient instructed on its use

## 2011-05-01 NOTE — Patient Instructions (Signed)
You have been scheduled for a colonoscopy with propofol. Please follow written instructions given to you at your visit today.  Please pick up your prep kit at the pharmacy within the next 1-3 days. You have been scheduled for a CT scan of the abdomen and pelvis at [facility] ([address]---this is in the same building as Architectural technologist).   You are scheduled on 05/05/11 Friday at 1:30 pm. You should arrive 15 minutes prior to your appointment time for registration. Please follow the written instructions below on the day of your exam:  WARNING: IF YOU ARE ALLERGIC TO IODINE/X-RAY DYE, PLEASE NOTIFY RADIOLOGY IMMEDIATELY AT [phone]! YOU WILL BE GIVEN A 13 HOUR PREMEDICATION PREP.  1) Do not eat or drink anything after 9:30 am (4 hours prior to your test) 2) You have been given 2 bottles of oral contrast to drink. The solution may taste better if refrigerated, but do NOT add ice or any other liquid to this solution. Shake well before drinking.    Drink 1 bottle of contrast @ 11:30 am (2 hours prior to your exam)  Drink 1 bottle of contrast @ 12:30 pm (1 hour prior to your exam)  You may take any medications as prescribed with a small amount of water except for the following: Metformin, Glucophage, Glucovance, Avandamet, Riomet, Fortamet, Actoplus Met, Janumet, Glumetza or Metaglip. The above medications must be held the day of the exam AND 48 hours after the exam.  The purpose of you drinking the oral contrast is to aid in the visualization of your intestinal tract. The contrast solution may cause some diarrhea. Before your exam is started, you will be given a small amount of fluid to drink. Depending on your individual set of symptoms, you may also receive an intravenous injection of x-ray contrast/dye. Plan on being at [facility] for 30 minutes or long, depending on the type of exam you are having performed.  If you have any questions regarding your exam or if you  need to reschedule, you may call the CT department at [phone] between the hours of 8:00 am and 5:00 pm, Monday-Friday.  ________________________________________________________________________

## 2011-05-02 ENCOUNTER — Encounter: Payer: Self-pay | Admitting: Internal Medicine

## 2011-05-04 ENCOUNTER — Telehealth: Payer: Self-pay | Admitting: Gynecologic Oncology

## 2011-05-04 NOTE — Telephone Encounter (Signed)
Pt notified about pap results: negative.  No questions or concerns voiced. 

## 2011-05-05 ENCOUNTER — Other Ambulatory Visit: Payer: PRIVATE HEALTH INSURANCE

## 2011-05-22 ENCOUNTER — Other Ambulatory Visit: Payer: PRIVATE HEALTH INSURANCE

## 2011-05-25 ENCOUNTER — Telehealth: Payer: Self-pay | Admitting: Internal Medicine

## 2011-05-25 NOTE — Telephone Encounter (Signed)
Pt rescheduled her CT scan and needed to move the colon also. Pt scheduled for colon 07/04/11@4pm . Pt mailed new prep instructions. Pt aware of appt date and time.

## 2011-05-29 ENCOUNTER — Encounter: Payer: PRIVATE HEALTH INSURANCE | Admitting: Internal Medicine

## 2011-06-13 ENCOUNTER — Other Ambulatory Visit: Payer: PRIVATE HEALTH INSURANCE

## 2011-06-16 ENCOUNTER — Ambulatory Visit (INDEPENDENT_AMBULATORY_CARE_PROVIDER_SITE_OTHER): Payer: PRIVATE HEALTH INSURANCE | Admitting: Family Medicine

## 2011-06-16 ENCOUNTER — Encounter: Payer: Self-pay | Admitting: Family Medicine

## 2011-06-16 ENCOUNTER — Encounter: Payer: Self-pay | Admitting: *Deleted

## 2011-06-16 ENCOUNTER — Ambulatory Visit (HOSPITAL_BASED_OUTPATIENT_CLINIC_OR_DEPARTMENT_OTHER)
Admission: RE | Admit: 2011-06-16 | Discharge: 2011-06-16 | Disposition: A | Discharge status: Home / Self Care | Payer: PRIVATE HEALTH INSURANCE | Source: Ambulatory Visit | Attending: Family Medicine | Admitting: Family Medicine

## 2011-06-16 VITALS — BP 127/80 | HR 77 | Temp 98.6°F | Ht 66.0 in | Wt 282.2 lb

## 2011-06-16 DIAGNOSIS — I8000 Phlebitis and thrombophlebitis of superficial vessels of unspecified lower extremity: Secondary | ICD-10-CM | POA: Insufficient documentation

## 2011-06-16 DIAGNOSIS — Z86718 Personal history of other venous thrombosis and embolism: Secondary | ICD-10-CM | POA: Insufficient documentation

## 2011-06-16 DIAGNOSIS — M79609 Pain in unspecified limb: Secondary | ICD-10-CM

## 2011-06-16 MED ORDER — NAPROXEN 500 MG PO TABS: 500.0000 mg | ORAL_TABLET | Freq: Two times a day (BID) | ORAL | Status: DC

## 2011-06-16 NOTE — Progress Notes (Signed)
  Subjective:    Patient ID: [name], female    DOB: 12/07/1955, 56 y.o.   MRN: [id number]  HPI Lump on leg- 1st noticed 1 week ago after dental procedure during which she was taking advil which causes her legs to swell.  When swelling went down, noticed lump on L lower leg.  Area is tender and was previously warm.  Hx of clotting disorder- 'cardiolipin antibody'.  Hx of clot.   Review of Systems For ROS see HPI     Objective:   Physical Exam  Vitals reviewed. Constitutional: She is oriented to person, place, and time. She appears well-developed and well-nourished. No distress.  Cardiovascular: Normal rate, regular rhythm and intact distal pulses.   Pulmonary/Chest: Effort normal and breath sounds normal. No respiratory distress. She has no wheezes.  Musculoskeletal: She exhibits edema (mild LE edema on L) and tenderness (TTP over L lower anterior shin, superficial knot present consistent w/ superficial phlebitis).  Neurological: She is alert and oriented to person, place, and time.  Skin: Skin is warm and dry. No erythema.          Assessment & Plan:

## 2011-06-16 NOTE — Patient Instructions (Signed)
We'll call you with your Ultrasound appt If superficial clot- ibuprofen and heat If DVT, will need to go to ER to start Coumadin Call with any questions or concerns Hang in there!

## 2011-06-18 NOTE — Assessment & Plan Note (Signed)
New to provider.  Pain not located over calf but more anterior L lower leg.  PE consistent w/ superficial thrombophlebitis but due to hx of anti-cardiolipin ab will need doppler to r/o DVT.  If superficial only, will start NSAIDs and heat- no abx due to lack of erythema.  If DVT, pt will need to go to ER to start coumadin.  Pt expressed understanding and is in agreement w/ plan.

## 2011-06-29 ENCOUNTER — Inpatient Hospital Stay: Admission: RE | Admit: 2011-06-29 | Payer: PRIVATE HEALTH INSURANCE | Source: Ambulatory Visit

## 2011-06-30 ENCOUNTER — Telehealth: Payer: Self-pay | Admitting: Internal Medicine

## 2011-06-30 NOTE — Telephone Encounter (Signed)
Yes charge. She had plenty of time to cancel sooner.

## 2011-07-04 ENCOUNTER — Encounter: Payer: PRIVATE HEALTH INSURANCE | Admitting: Internal Medicine

## 2011-07-25 ENCOUNTER — Encounter: Payer: Self-pay | Admitting: *Deleted

## 2011-07-28 ENCOUNTER — Ambulatory Visit (INDEPENDENT_AMBULATORY_CARE_PROVIDER_SITE_OTHER): Payer: PRIVATE HEALTH INSURANCE | Admitting: Cardiology

## 2011-07-28 ENCOUNTER — Encounter: Payer: Self-pay | Admitting: Cardiology

## 2011-07-28 VITALS — BP 126/88 | HR 82 | Ht 66.0 in | Wt 277.0 lb

## 2011-07-28 DIAGNOSIS — H04129 Dry eye syndrome of unspecified lacrimal gland: Secondary | ICD-10-CM

## 2011-07-28 DIAGNOSIS — R002 Palpitations: Secondary | ICD-10-CM

## 2011-07-28 DIAGNOSIS — R5381 Other malaise: Secondary | ICD-10-CM

## 2011-07-28 DIAGNOSIS — E785 Hyperlipidemia, unspecified: Secondary | ICD-10-CM

## 2011-07-28 DIAGNOSIS — R5383 Other fatigue: Secondary | ICD-10-CM

## 2011-07-28 DIAGNOSIS — R609 Edema, unspecified: Secondary | ICD-10-CM

## 2011-07-28 LAB — CBC WITH DIFFERENTIAL/PLATELET
Eosinophils Relative: 0 % (ref 0–5)
HCT: 43.2 % (ref 36.0–46.0)
Lymphocytes Relative: 26 % (ref 12–46)
Lymphs Abs: 2 10*3/uL (ref 0.7–4.0)
MCH: 30.9 pg (ref 26.0–34.0)
MCV: 90.9 fL (ref 78.0–100.0)
Monocytes Absolute: 0.6 10*3/uL (ref 0.1–1.0)
RBC: 4.75 MIL/uL (ref 3.87–5.11)
RDW: 13.4 % (ref 11.5–15.5)
WBC: 7.9 10*3/uL (ref 4.0–10.5)

## 2011-07-28 NOTE — Patient Instructions (Addendum)
Your physician recommends that you have lab work today--CBC/TSH.  Your physician recommends that you return for a FASTING lipid profile /HS CRP/LPa --this is scheduled for Tuesday July 23,2013. The lab opens at 8:30am.   Your physician wants you to follow-up in: 1 year with Dr Shirlee Latch. (July 2014). You will receive a reminder letter in the mail two months in advance. If you don't receive a letter, please call our office to schedule the follow-up appointment.

## 2011-07-28 NOTE — Progress Notes (Signed)
Patient ID: [name], female   DOB: 20-Jun-1955, 55 y.o.   MRN: [id number] PCP: Dr. Alwyn Ren  56 yo with history of antiphospholipid antibody syndrome, prior endometrial cancer, and palpitations with brief runs of atrial tachycardia as well as frequent PACs on past monitoring presents for cardiology followup.  She had a negative stress echo in 2011.   She continues to feel palpitations occasionally, usually while lying in bed at night.  They are not particularly bothersome and she does not note any long runs of tachycardia. She has not wanted to take a beta blocker.  No lightheadedness or syncope.  No chest pain or exertional dyspnea.  On some days, she feels very fatigued.  No snoring or daytime sleepiness.  For the last 6 months, she has also noted a dry mouth and dry eyes.  Finally, she has had L>R leg swelling.  She was diagnosed by ultrasound with superficial thrombophlebitis in that leg (no DVT).    Labs (7/11): HCT 40.6, K 4.3, creatinine 0.6, TSH normal, free T4 normal  Labs (10/11): K 4.8, creatinine 1.1, LFTs normal, LDL 158, HDL 58  Labs (1/12): LDL 150, Lp(a) 104  ECG: NSR, PACs  Past Medical History:  1. Encephalitis with coma age 22  2. CMV pneumonia 1993  3. Colitis 2006, ? ischemic  4. Clotting disorder followed by Dr Myna Hidalgo (sounds like antiphospholipid antibody syndrome given positive anticardiolipin antibody and positve beta-2 glycoprotetin tests). ASA 81 mg once daily.  Has had superficial thrombophlebitis L leg (6/13 ultrasound).   5. Edema LLE post ACL sprain 01/2008  6. Allergic rhinitis  7. Hyperlipidemia  8. ?RAD wheezes with RTIs  9. Colonic polyps, hx of  10. Diverticulosis, colon  11. Endometrial cancer s/p hysterectomy  12. Obesity  13. Stress echo (10/11): EF 60-65%, no significant valvular abnormalities. No wall motion abnormalities with exertion, suggesting no ischemia.  14. Palpitations: Holter (10/11): 9.6% of beats were atrial ectopy, average HR 78, short  runs of up to 5-6 beats of probable atrial tachycaria.   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 Husband had liver transplant.   Review of Systems  All systems reviewed and negative except as per HPI.   Current Outpatient Prescriptions  Medication Sig Dispense Refill  . albuterol (PROVENTIL HFA;VENTOLIN HFA) 108 (90 BASE) MCG/ACT inhaler Inhale 2 puffs into the lungs every 6 (six) hours as needed for wheezing.  1 Inhaler  1  . aspirin 81 MG tablet Take 81 mg by mouth daily.        . Flaxseed, Linseed, (FLAX SEED OIL) 1000 MG CAPS Take 1,000 mg by mouth daily.        Marland Kitchen HYDROcodone-homatropine (HYDROMET) 5-1.5 MG/5ML syrup Take 2.5 mLs by mouth every 6 (six) hours as needed. 1 teaspoon every 6 hours as needed  120 mL  0  . Multiple Vitamin (MULTIVITAMIN) tablet Take 1 tablet by mouth daily.        . peg 3350 powder (MOVIPREP) SOLR Take 1 kit (100 g total) by mouth once. As directed  1 kit  0  . vitamin C (ASCORBIC ACID) 500 MG tablet Take 500 mg by mouth daily.        BP 126/88  Pulse 82  Ht 5\' 6"  (1.676  m)  Wt 277 lb (125.646 kg)  BMI 44.71 kg/m2 General: NAD, obese Neck: No JVD, no thyromegaly or thyroid nodule.  Lungs: Clear to auscultation bilaterally with normal respiratory effort. CV: Nondisplaced PMI.  Heart regular S1/S2, no S3/S4, no murmur.  1+ edema left ankle.  No carotid bruit.  Normal pedal pulses.  Abdomen: Soft, nontender, no hepatosplenomegaly, no distention.   Neurologic: Alert and oriented x 3.  Psych: Normal affect. Extremities: No clubbing or cyanosis.

## 2011-07-31 ENCOUNTER — Ambulatory Visit (INDEPENDENT_AMBULATORY_CARE_PROVIDER_SITE_OTHER): Payer: PRIVATE HEALTH INSURANCE | Admitting: *Deleted

## 2011-07-31 DIAGNOSIS — R002 Palpitations: Secondary | ICD-10-CM

## 2011-07-31 DIAGNOSIS — E785 Hyperlipidemia, unspecified: Secondary | ICD-10-CM | POA: Insufficient documentation

## 2011-07-31 DIAGNOSIS — R5383 Other fatigue: Secondary | ICD-10-CM | POA: Insufficient documentation

## 2011-07-31 LAB — LIPID PANEL
HDL: 56.1 mg/dL (ref >39.00)
Total CHOL/HDL Ratio: 4
VLDL: 22.6 mg/dL (ref 0.0–40.0)

## 2011-07-31 LAB — C-REACTIVE PROTEIN: CRP: 1 mg/dL (ref 1–20)

## 2011-07-31 NOTE — Assessment & Plan Note (Signed)
Reviewed last lipids: worrisome with high LDL but also quite high Lp(a).  Think this puts her in a higher risk category.  She will need a statin.  Repeat lipids, Lp(a), and hs-CRP to decide which statin and what dose.

## 2011-07-31 NOTE — Assessment & Plan Note (Signed)
Dry mouth/dry eyes x 6 months per patient.  ? Sjogrens.  Asked her to followup about this with Dr. Alwyn Ren.

## 2011-07-31 NOTE — Assessment & Plan Note (Signed)
Generalized.  Will check TSH and CBC.  Does not screen positive for OSA.

## 2011-07-31 NOTE — Assessment & Plan Note (Signed)
Thrombophlebitis (superficial) left leg with no DVT.  Has apparent history of antiphospholipid antibody syndrome.  Sees Dr. Myna Hidalgo.  On aspirin, has never had DVT.

## 2011-07-31 NOTE — Assessment & Plan Note (Signed)
PACs, brief atrial tachycardia on prior monitoring.  Continues to have occasional mild palpitations.  PACs on ECG today.  No documented atrial fibrillation.  Not particularly symptomatic.  Continue to monitor.  At risk for atrial fibrillation given family history and obesity.  Needs to decrease caffeine intake to help with PACs.

## 2011-08-01 ENCOUNTER — Other Ambulatory Visit: Payer: PRIVATE HEALTH INSURANCE

## 2011-08-01 LAB — LIPOPROTEIN A (LPA): Lipoprotein (a): 91 mg/dL — ABNORMAL HIGH (ref 0–30)

## 2011-08-02 ENCOUNTER — Telehealth: Payer: Self-pay | Admitting: *Deleted

## 2011-08-02 ENCOUNTER — Ambulatory Visit (INDEPENDENT_AMBULATORY_CARE_PROVIDER_SITE_OTHER)
Admission: RE | Admit: 2011-08-02 | Discharge: 2011-08-02 | Disposition: A | Discharge status: Home / Self Care | Payer: PRIVATE HEALTH INSURANCE | Source: Ambulatory Visit | Attending: Internal Medicine | Admitting: Internal Medicine

## 2011-08-02 DIAGNOSIS — R7989 Other specified abnormal findings of blood chemistry: Secondary | ICD-10-CM

## 2011-08-02 DIAGNOSIS — E785 Hyperlipidemia, unspecified: Secondary | ICD-10-CM

## 2011-08-02 DIAGNOSIS — R1032 Left lower quadrant pain: Secondary | ICD-10-CM

## 2011-08-02 MED ORDER — IOHEXOL 300 MG/ML  SOLN
100.0000 mL | Freq: Once | INTRAMUSCULAR | Status: AC | PRN
  Administered 2011-08-02: 100 mL via INTRAVENOUS

## 2011-08-02 MED ORDER — ATORVASTATIN CALCIUM 20 MG PO TABS: 20.0000 mg | ORAL_TABLET | Freq: Every day | ORAL | Status: DC

## 2011-08-02 NOTE — Telephone Encounter (Signed)
Notes Recorded by Jacqlyn Krauss, RN on 08/02/2011 at 8:45 AM Spoke with pt. She will begin atorvastatin 20mg  daily. She will return for fasting lipid/liver profile 09/21/11. ------  Notes Recorded by Laurey Morale, MD on 08/01/2011 at 4:26 PM Very elevated, want her to start statin as on last note. ------  Notes Recorded by Laurey Morale, MD on 07/31/2011 at 4:11 PM Start atorvastatin 20 mg daily with repeat lipids/LFTs in 2 months

## 2011-08-02 NOTE — Telephone Encounter (Signed)
Notes Recorded by Jacqlyn Krauss, RN on 08/02/2011 at 8:49 AM Spoke with pt. She will return for TSH/free T4/ free T3 08/07/11. ------  Notes Recorded by Laurey Morale, MD on 07/31/2011 at 6:11 AM TSH is a little high. Please repeat TSH and free T4/free T3

## 2011-08-04 ENCOUNTER — Encounter: Payer: Self-pay | Admitting: Internal Medicine

## 2011-08-04 ENCOUNTER — Ambulatory Visit (INDEPENDENT_AMBULATORY_CARE_PROVIDER_SITE_OTHER): Payer: PRIVATE HEALTH INSURANCE | Admitting: Internal Medicine

## 2011-08-04 VITALS — BP 124/80 | HR 68 | Wt 277.0 lb

## 2011-08-04 DIAGNOSIS — K7689 Other specified diseases of liver: Secondary | ICD-10-CM

## 2011-08-04 DIAGNOSIS — Z8572 Personal history of non-Hodgkin lymphomas: Secondary | ICD-10-CM | POA: Insufficient documentation

## 2011-08-04 DIAGNOSIS — R5381 Other malaise: Secondary | ICD-10-CM

## 2011-08-04 DIAGNOSIS — K760 Fatty (change of) liver, not elsewhere classified: Secondary | ICD-10-CM

## 2011-08-04 DIAGNOSIS — R946 Abnormal results of thyroid function studies: Secondary | ICD-10-CM

## 2011-08-04 DIAGNOSIS — R9389 Abnormal findings on diagnostic imaging of other specified body structures: Secondary | ICD-10-CM

## 2011-08-04 DIAGNOSIS — E785 Hyperlipidemia, unspecified: Secondary | ICD-10-CM

## 2011-08-04 NOTE — Patient Instructions (Addendum)
Please  schedule fasting Labs : hepatic panel, free T4,freeT3, TSH, D- dimer.  PLEASE BRING THESE INSTRUCTIONS TO FOLLOW UP  LAB APPOINTMENT.This will guarantee correct labs are drawn, eliminating need for repeat blood sampling ( needle sticks ! ). Diagnoses /Codes: dyspnea, abnormal lung Xray,794.5,995.20 . Please try to go on My Chart within the next 24 hours to allow me to release the results directly to you.  Eat a low-fat diet with lots of fruits and vegetables, up to 7-9 servings per day. Consume less than 30 (preferably ZERO) grams of sugar per day from foods & drinks with High Fructose Corn Syrup (HFCS) sugar as #1,2,3 or # 4 on label.Whole Foods, [employer] & [employer] do not carry products with HFCS. Follow a  low carb nutrition program such as [place] or [employer]  to prevent Diabetes progression . White carbohydrates (potatoes, rice, bread, and pasta) have a high spike of sugar and a high load of sugar. For example a  baked potato has a cup of sugar and a  french fry  2 teaspoons of sugar. Yams, wild  rice, whole grained bread &  wheat pasta have been much lower spike and load of  sugar. Portions should be the size of a deck of cards or your palm. Depending on the results of the d-dimer; CAT scan of the chest would be scheduled in near future or at @ least  @  3 months

## 2011-08-04 NOTE — Progress Notes (Signed)
  Subjective:    Patient ID: [name], female    DOB: May 25, 1955, 56 y.o.   MRN: [id number]  HPI Her gastroenterologist performed a CT of the pelvis and abdomen which showed an incidental 1.9 x 1.4 cm groundglass opacity inferior right lower lobe. She's never smoked but was exposed growing up to 2 parents who were chain smokers. Her mother died of lung cancer.  She has had several months of fatigue and breathlessness. She denies cough, sputum, or hemoptysis.  Her TSH was 5.34517/19. Followup thyroid function tests are pending    Review of Systems Her cardiologist has recommended Lipitor for her dyslipidemia. Her prior advanced cholesterol tests were reviewed. Entries were entered into the problem list defining  the long-term risk. Both her  lipoprotein a   & C reactive protein values had improved significantly  The CT scan  showed fatty infiltration of liver. The triglycerides have dropped to 113; she has been avoiding high fructose corn syrup sugars .Her previously elevated insulin level  suggests that she would benefit from a low glycemic carbs nutrition program     Objective:   Physical Exam General appearance ;well  nourished w/o distress.  Eyes: No conjunctival inflammation or scleral icterus is present.  Oral exam: Dental hygiene is good; lips and gums are healthy appearing.There is no oropharyngeal erythema or exudate noted.   Heart:  Normal rate and regular rhythm. S1 and S2 normal without gallop, murmur, click, rub . Rare premature beat  Lungs:Chest clear to auscultation; no wheezes, rhonchi,rales ,or rubs present.No increased work of breathing.   Abdomen: bowel sounds normal, soft and non-tender without masses, organomegaly or hernias noted.  No guarding or rebound   Skin:Warm & dry.  Intact without suspicious lesions or rashes ; no jaundice or tenting  Lymphatic: No lymphadenopathy is noted about the head, neck, axilla areas.              Assessment & Plan:    #1 right lower lobe incidental abnormality of questionable significance. Only risk was remote secondhand smoke exposure and occupational exposures on job sites. Repeat CT of the chest recommended in 3 months.D dimer recommended  #2 fatty liver on CT scan; pathophysiology discussed  #3 fatigue; TSH minimally elevated. Additional thyroid function test pending. I would recommend repeating the TSH with a free T3 and free T4 for verification of possible hypothyroidism.  #4 dyslipidemia; associated risk  as noted above. Recommend: Lipitor until the fasting liver panel can be completed

## 2011-08-07 ENCOUNTER — Other Ambulatory Visit: Payer: PRIVATE HEALTH INSURANCE

## 2011-08-07 ENCOUNTER — Other Ambulatory Visit (INDEPENDENT_AMBULATORY_CARE_PROVIDER_SITE_OTHER): Payer: PRIVATE HEALTH INSURANCE

## 2011-08-07 ENCOUNTER — Telehealth: Payer: Self-pay

## 2011-08-07 DIAGNOSIS — R6889 Other general symptoms and signs: Secondary | ICD-10-CM

## 2011-08-07 DIAGNOSIS — E785 Hyperlipidemia, unspecified: Secondary | ICD-10-CM

## 2011-08-07 DIAGNOSIS — R7989 Other specified abnormal findings of blood chemistry: Secondary | ICD-10-CM

## 2011-08-07 LAB — HEPATIC FUNCTION PANEL
ALT: 22 U/L (ref 0–35)
AST: 23 U/L (ref 0–37)
Alkaline Phosphatase: 92 U/L (ref 39–117)
Bilirubin, Direct: 0 mg/dL (ref 0.0–0.3)
Total Bilirubin: 0.5 mg/dL (ref 0.3–1.2)

## 2011-08-07 LAB — T4, FREE: Free T4: 0.8 ng/dL (ref 0.60–1.60)

## 2011-08-07 LAB — LDL CHOLESTEROL, DIRECT: Direct LDL: 152.5 mg/dL

## 2011-08-07 NOTE — Telephone Encounter (Signed)
Left message on voicemail informing patient Dr.Hopper will set up CT based on elevated D-Dimer. Patient schedule to come in tomorrow for kidney function test to be checked in order for CT to be done.

## 2011-08-08 ENCOUNTER — Other Ambulatory Visit: Payer: PRIVATE HEALTH INSURANCE

## 2011-08-08 ENCOUNTER — Other Ambulatory Visit: Payer: Self-pay | Admitting: Internal Medicine

## 2011-08-08 ENCOUNTER — Ambulatory Visit (INDEPENDENT_AMBULATORY_CARE_PROVIDER_SITE_OTHER)
Admission: RE | Admit: 2011-08-08 | Discharge: 2011-08-08 | Disposition: A | Discharge status: Home / Self Care | Payer: PRIVATE HEALTH INSURANCE | Source: Ambulatory Visit | Attending: Internal Medicine | Admitting: Internal Medicine

## 2011-08-08 DIAGNOSIS — R791 Abnormal coagulation profile: Secondary | ICD-10-CM

## 2011-08-08 DIAGNOSIS — R0989 Other specified symptoms and signs involving the circulatory and respiratory systems: Secondary | ICD-10-CM

## 2011-08-08 DIAGNOSIS — R0600 Dyspnea, unspecified: Secondary | ICD-10-CM

## 2011-08-08 DIAGNOSIS — R7989 Other specified abnormal findings of blood chemistry: Secondary | ICD-10-CM

## 2011-08-08 DIAGNOSIS — R0609 Other forms of dyspnea: Secondary | ICD-10-CM

## 2011-08-08 DIAGNOSIS — R9389 Abnormal findings on diagnostic imaging of other specified body structures: Secondary | ICD-10-CM

## 2011-08-08 DIAGNOSIS — R918 Other nonspecific abnormal finding of lung field: Secondary | ICD-10-CM

## 2011-08-08 LAB — BUN: BUN: 12 mg/dL (ref 6–23)

## 2011-08-08 MED ORDER — IOHEXOL 350 MG/ML SOLN: 100.0000 mL | Freq: Once | INTRAVENOUS | Status: AC | PRN

## 2011-08-11 ENCOUNTER — Encounter: Payer: PRIVATE HEALTH INSURANCE | Admitting: Internal Medicine

## 2011-08-18 ENCOUNTER — Encounter: Payer: Self-pay | Admitting: Internal Medicine

## 2011-08-18 ENCOUNTER — Ambulatory Visit (INDEPENDENT_AMBULATORY_CARE_PROVIDER_SITE_OTHER): Payer: PRIVATE HEALTH INSURANCE | Admitting: Internal Medicine

## 2011-08-18 VITALS — BP 124/80 | HR 87 | Wt 275.8 lb

## 2011-08-18 DIAGNOSIS — K117 Disturbances of salivary secretion: Secondary | ICD-10-CM

## 2011-08-18 DIAGNOSIS — R9389 Abnormal findings on diagnostic imaging of other specified body structures: Secondary | ICD-10-CM

## 2011-08-18 DIAGNOSIS — M2550 Pain in unspecified joint: Secondary | ICD-10-CM

## 2011-08-18 DIAGNOSIS — H04129 Dry eye syndrome of unspecified lacrimal gland: Secondary | ICD-10-CM

## 2011-08-18 DIAGNOSIS — E785 Hyperlipidemia, unspecified: Secondary | ICD-10-CM

## 2011-08-18 DIAGNOSIS — R413 Other amnesia: Secondary | ICD-10-CM

## 2011-08-18 DIAGNOSIS — L851 Acquired keratosis [keratoderma] palmaris et plantaris: Secondary | ICD-10-CM

## 2011-08-18 DIAGNOSIS — IMO0001 Reserved for inherently not codable concepts without codable children: Secondary | ICD-10-CM

## 2011-08-18 NOTE — Patient Instructions (Addendum)
Review and correct the record as indicated. Please share record with all medical staff seen.  

## 2011-08-18 NOTE — Addendum Note (Signed)
Addended by: Silvio Pate D on: 08/18/2011 04:46 PM   Modules accepted: Orders

## 2011-08-18 NOTE — Progress Notes (Signed)
  Subjective:    Patient ID: [name], female    DOB: 1955-06-20, 56 y.o.   MRN: [id number]  HPI We met and discussed the pulmonary nodule and options to evaluate this. I attempted to localize it on the CAT scan but could not guarantee that I could see it is going to images.  This was an incidental finding on CAT scan of the abdomen. The CAT scan of the chest was pursued at this time because of an elevated d-dimer performed because of some shortness of breath. There was no evidence of pulmonary emboli.  She was exposed to secondhand smoke significant amount going up. Her mother had lung cancer.  She had endometrial cancer in 2006 and had total abdominal hysterectomy and bilateral salpingo-oophorectomy.  She has never smoked.  She inquires about the possibility of a PET scan    Review of Systems She denies any constitutional symptoms of fever, chills, sweats, or weight loss. She states that her shortness of breath  is intermittently present.  She is concerned as to starting a statin the potential liver issues. Her liver function studies were extremely low.  Even though her thyroid function test has been therapeutic recently she concerned about the enlargement of thyroid gland on CAT scan and the previously abnormal studies and February 2012. She has symptoms of tiredness, dry mouth, dry eye and dry skin. She has diffuse arthralgias and myalgias, worse since December. Her CRP was 1 on 07/31/11. She's also had some numbness in her hand.  She's concerned about her ability to complete complicated mental tasks and memory impairment     Objective:   Physical Exam She has no lymphadenopathy about the neck or axilla.  Thyroid is normal without nodularity or asymmetry.  She exhibits an S4 with no significant murmur  Chest is clear to auscultation with no abnormal breath sounds or increased work of breathing        Assessment & Plan:  #1 incidentally noted pulmonary nodule right  lower lobe. Serial CAT scan monitor in 3 months was recommended. I reviewed other options such as bronchoscopy and transbronchial biopsy, needle biopsy under fluoroscopy guidance, or surgery.  I will ask Dr. Archer Asa his opinion of the PET scan question.  #2 some of her symptoms suggest possible collagen vascular issues. I will recommend a rheumatology consult. A full thyroid panel was completely normal.

## 2011-08-19 LAB — RPR

## 2011-08-21 ENCOUNTER — Telehealth: Payer: Self-pay

## 2011-08-21 ENCOUNTER — Other Ambulatory Visit: Payer: Self-pay | Admitting: Internal Medicine

## 2011-08-21 DIAGNOSIS — R768 Other specified abnormal immunological findings in serum: Secondary | ICD-10-CM

## 2011-08-21 DIAGNOSIS — M791 Myalgia, unspecified site: Secondary | ICD-10-CM

## 2011-08-21 DIAGNOSIS — M2550 Pain in unspecified joint: Secondary | ICD-10-CM

## 2011-08-21 DIAGNOSIS — H04123 Dry eye syndrome of bilateral lacrimal glands: Secondary | ICD-10-CM

## 2011-08-21 LAB — ANTI-NUCLEAR AB-TITER (ANA TITER)

## 2011-08-21 LAB — ANA: Anti Nuclear Antibody(ANA): POSITIVE — AB

## 2011-08-21 NOTE — Telephone Encounter (Signed)
Message copied by Maurice Small on Mon Aug 21, 2011  1:43 PM ------      Message from: Pecola Lawless      Created: Mon Aug 21, 2011 12:58 PM                   ----- Message -----         From: [facility] Interface         Sent: 08/19/2011  12:08 AM           To: Pecola Lawless, MD

## 2011-08-21 NOTE — Telephone Encounter (Signed)
Called home number-memory full, unable to leave message. I called patient on cell and discussed with her:  ANA is a screening test for a specific connective tissue disease; we need to await the titers to determine the significance of the positive result. Additionally a rheumatology consult is indicated.I'll place that order pending these results. My chart did not let me release the ANA results as the results are incomplete .Hopp             Patient aware Referral to be placed once final results populated and reviewed by MD

## 2011-09-08 ENCOUNTER — Encounter: Payer: Self-pay | Admitting: Internal Medicine

## 2011-09-08 ENCOUNTER — Ambulatory Visit (AMBULATORY_SURGERY_CENTER): Payer: PRIVATE HEALTH INSURANCE | Admitting: Internal Medicine

## 2011-09-08 DIAGNOSIS — Z1211 Encounter for screening for malignant neoplasm of colon: Secondary | ICD-10-CM

## 2011-09-08 DIAGNOSIS — Z860100 Personal history of colon polyps, unspecified: Secondary | ICD-10-CM

## 2011-09-08 DIAGNOSIS — R198 Other specified symptoms and signs involving the digestive system and abdomen: Secondary | ICD-10-CM

## 2011-09-08 DIAGNOSIS — K5730 Diverticulosis of large intestine without perforation or abscess without bleeding: Secondary | ICD-10-CM

## 2011-09-08 DIAGNOSIS — K625 Hemorrhage of anus and rectum: Secondary | ICD-10-CM

## 2011-09-08 DIAGNOSIS — R1032 Left lower quadrant pain: Secondary | ICD-10-CM

## 2011-09-08 MED ORDER — SODIUM CHLORIDE 0.9 % IV SOLN: 500.0000 mL | INTRAVENOUS | Status: DC

## 2011-09-08 NOTE — Op Note (Signed)
[facility] [address] [place], [address]   COLONOSCOPY PROCEDURE REPORT  PATIENT: Crystal, Beltran  MR#: [id number] BIRTHDATE: 10/07/1955 , 56  yrs. old GENDER: Female ENDOSCOPIST: Roxy Cedar, MD REFERRED [id number] Self, M.D. PROCEDURE DATE:  09/08/2011 PROCEDURE:   Colonoscopy, diagnostic ASA CLASS:   Class II INDICATIONS:abdominal pain in the lower left quadrant and hematochezia.   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  A digital rectal exam revealed no abnormalities of the rectum.   The LB CF-[id number]  endoscope was introduced through the anus and advanced to the cecum, which was identified by both the appendix and ileocecal valve. No adverse events experienced.   The quality of the prep was excellent, using MoviPrep  The instrument was then slowly withdrawn as the colon was fully examined.      COLON FINDINGS: Severe diverticulosis was noted throughout the entire examined colon. The changes were most prominent in the sigmoid colon with marked stenosis of the distal sigmoid.   The colon was otherwise normal.  There was no inflamation, polyps or cancers.  Retroflexed views revealed internal hemorrhoids. The time to cecum=6 minutes 51 seconds.  Withdrawal time=6 minutes 14 seconds.  The scope was withdrawn and the procedure completed.  COMPLICATIONS: There were no complications.  ENDOSCOPIC IMPRESSION: 1.   Severe diverticulosis was noted throughout the entire examined colon with sigmoid stenosis 2.   The colon was otherwise normal  RECOMMENDATIONS: 1.  High fiber diet 2.  Continue current colorectal screening recommendations for "routine risk" patients with a repeat colonoscopy in 10  years.   eSigned:  Roxy Cedar, MD 09/08/2011 4:19 PM  cc: Pecola Lawless, MD and The Patient   PATIENT NAME:  Crystal, Beltran MR#: [id number]

## 2011-09-08 NOTE — Progress Notes (Signed)
Pt. States gas pain is better, although she still has some abdominal discomfort.  Encouraged to be up and about when she gets home.

## 2011-09-08 NOTE — Progress Notes (Signed)
Patient did not experience any of the following events: a burn prior to discharge; a fall within the facility; wrong site/side/patient/procedure/implant event; or a hospital transfer or hospital admission upon discharge from the facility. (G8907) Patient did not have preoperative order for IV antibiotic SSI prophylaxis. (G8918)  

## 2011-09-08 NOTE — Patient Instructions (Addendum)

## 2011-09-12 ENCOUNTER — Telehealth: Payer: Self-pay | Admitting: *Deleted

## 2011-09-12 NOTE — Telephone Encounter (Signed)
Left message that we called for f/u 

## 2011-09-21 ENCOUNTER — Other Ambulatory Visit: Payer: PRIVATE HEALTH INSURANCE

## 2011-09-28 ENCOUNTER — Telehealth: Payer: Self-pay | Admitting: Internal Medicine

## 2011-09-28 NOTE — Telephone Encounter (Signed)
Please copy & mail this note to [facility]

## 2011-09-28 NOTE — Telephone Encounter (Signed)
In reference to your Rheumatology referral entered on 08/21/11, patient will not respond to any of the attempts to reach her in regards to scheduling this appointment.  Rheumatology has tried to reach patient by phone multiple times, and mailed her a postcard.  Also, I have have left this patient multiple messages, and she will not return my call.

## 2011-10-06 ENCOUNTER — Ambulatory Visit (INDEPENDENT_AMBULATORY_CARE_PROVIDER_SITE_OTHER): Payer: PRIVATE HEALTH INSURANCE | Admitting: Internal Medicine

## 2011-10-06 ENCOUNTER — Encounter: Payer: Self-pay | Admitting: Internal Medicine

## 2011-10-06 VITALS — BP 122/74 | HR 64 | Temp 98.2°F | Wt 273.0 lb

## 2011-10-06 DIAGNOSIS — I809 Phlebitis and thrombophlebitis of unspecified site: Secondary | ICD-10-CM

## 2011-10-06 LAB — D-DIMER, QUANTITATIVE: D-Dimer, Quant: 1.49 ug/mL-FEU — ABNORMAL HIGH (ref 0.00–0.48)

## 2011-10-06 MED ORDER — AMOXICILLIN 500 MG PO CAPS: 500.0000 mg | ORAL_CAPSULE | Freq: Three times a day (TID) | ORAL | Status: DC

## 2011-10-06 NOTE — Progress Notes (Signed)
  Subjective:    Patient ID: [name], female    DOB: 04/24/1955, 56 y.o.   MRN: [id number]  HPI In late May she noticed a "lump" incidentally over the medial aspect of the right calf. She denies any injury or trigger prior to the appearance of this change. Over the last week she's noted some minimal pressure in this area particularly at night.  She cannot take Advil as it caused ankle edema; she has been able to take Aleve She denies chest pain,  dyspnea, hemoptysis or paroxysmal nocturnal dyspnea she had an extensive cardiac evaluation which documented premature beats.   Review of Systems Constitutional: no fever, chills, sweats, change in weight  Musculoskeletal:no  muscle cramps or pain; no  joint stiffness, redness, or swelling Skin:no rash, color or temp change Neuro: no weakness; incontinence (stool/urine); numbness and tingling Heme:no lymphadenopathy; abnormal bruising or bleeding        Objective:   Physical Exam She appears well-nourished;s he is in no acute distress  No carotid bruits are present.  Heart rhythm and rate are normal with no significant murmurs or gallops. S4  Chest is clear with no increased work of breathing   She has no clubbing or edema.   Pedal pulses are intact  But slightly decreased  There is a slightly tender superficial thrombosed vein over the left medial calf. Homans sign is negative  No ischemic skin changes are present        Assessment & Plan:    #1 superficial thrombophlebitis with negative negative Homans sign Plan: See orders and recommendations

## 2011-10-06 NOTE — Patient Instructions (Addendum)
Use warm moist compresses to 3 times a day to the affected area. Please take Aleve one to 2 every 8-12 hours with food to treat inflammation.

## 2011-10-07 ENCOUNTER — Ambulatory Visit (HOSPITAL_COMMUNITY)
Admission: RE | Admit: 2011-10-07 | Discharge: 2011-10-07 | Disposition: A | Discharge status: Home / Self Care | Payer: PRIVATE HEALTH INSURANCE | Source: Ambulatory Visit | Attending: Family Medicine | Admitting: Family Medicine

## 2011-10-07 ENCOUNTER — Telehealth: Payer: Self-pay | Admitting: Family Medicine

## 2011-10-07 ENCOUNTER — Encounter: Payer: Self-pay | Admitting: Family Medicine

## 2011-10-07 ENCOUNTER — Emergency Department (HOSPITAL_COMMUNITY)
Admission: EM | Admit: 2011-10-07 | Discharge: 2011-10-07 | Disposition: A | Discharge status: Other Acute Inpatient Hospital | Payer: PRIVATE HEALTH INSURANCE

## 2011-10-07 ENCOUNTER — Ambulatory Visit (INDEPENDENT_AMBULATORY_CARE_PROVIDER_SITE_OTHER): Payer: PRIVATE HEALTH INSURANCE | Admitting: Family Medicine

## 2011-10-07 VITALS — BP 126/84 | HR 82 | Temp 98.2°F | Wt 272.0 lb

## 2011-10-07 DIAGNOSIS — R7989 Other specified abnormal findings of blood chemistry: Secondary | ICD-10-CM

## 2011-10-07 DIAGNOSIS — R791 Abnormal coagulation profile: Secondary | ICD-10-CM

## 2011-10-07 DIAGNOSIS — M7989 Other specified soft tissue disorders: Secondary | ICD-10-CM | POA: Insufficient documentation

## 2011-10-07 DIAGNOSIS — M79609 Pain in unspecified limb: Secondary | ICD-10-CM | POA: Insufficient documentation

## 2011-10-07 NOTE — Telephone Encounter (Signed)
Call report from vascular lab at [facility]- venous doppler of leg is neg for DVT Pos for likely phlebitis with thickening of vein wall, but no clot I asked them to inform pt of results  She will use warm compresses over the weekend and f/u with her PCP

## 2011-10-07 NOTE — Patient Instructions (Addendum)
Go to [facility] --through the ER entrance- walk up to admitting window and tell them you are there for an outpatient doppler I will call you with results  Elevate leg and use warm compress Call if symptoms worsen

## 2011-10-07 NOTE — Progress Notes (Signed)
Subjective:    Patient ID: [name], female    DOB: 1955-06-14, 56 y.o.   MRN: [id number]  HPI Here for follow up of pos D Dimer in setting of superficial thrombophlebitis   Does not have hx of DVT in past  ? If she has a clotting disorder in the past  Anticardiolipin ab -- not enough to put her on anything more than an aspirin   In summer - had a doppler of L leg for susp of DVT-normal  Also CT of chest All was ok   Lab Results  Component Value Date   DDIMER 1.49* 10/06/2011   This is similar to last time   Same spot bothers her - has not changed  Lump over L medial calf  Is tender Not red  No pain to move foot or walk on it   No cp or sob Feels fine   Patient Active Problem List  Diagnosis  . Other and unspecified hyperlipidemia  . OTHER AND UNSPECIFIED COAGULATION DEFECTS  . DRY EYE SYNDROME  . ALLERGIC RHINITIS DUE TO OTHER ALLERGEN  . ALLERGIC RHINITIS  . DRY MOUTH  . UNSPECIFIED DISEASE OF THE SALIVARY GLANDS  . HIATAL HERNIA  . DIVERTICULOSIS, COLON  . DIVERTICULITIS OF COLON  . BREAST PAIN, RIGHT  . UNSPECIFIED MENOPAUSAL&POSTMENOPAUSAL DISORDER  . DRY SKIN  . KNEE PAIN  . CALF PAIN, LEFT  . EDEMA- LOCALIZED  . PALPITATIONS  . DYSPNEA  . DYSPNEA/SHORTNESS OF BREATH  . CHEST PAIN  . ABDOMINAL PAIN, LEFT LOWER QUADRANT  . COLONIC POLYPS, HX OF  . POLYP, COLON  . RECTAL BLEEDING  . BENIGN POSITIONAL VERTIGO  . THYROID FUNCTION TEST, ABNORMAL  . Fatigue  . Hyperlipidemia  . Abnormal chest CT  . Morbid obesity  . Leg swelling  . Positive D dimer   Past Medical History  Diagnosis Date  . 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 Pneumonia     CMV pneumonia 1994  . Colitis 08/2004  . Endometrial cancer     S/P Hysterectomy and BSO  . Obesity   . Clotting disorder   . Allergic rhinitis   . Colon polyps   . Diverticulosis   . Hemorrhoids   . Palpitations   . Other and unspecified hyperlipidemia 06/22/2007    Centricity Description:  HYPERLIPIDEMIA Qualifier: Diagnosis of  By: Tawanna Cooler MD, Eugenio Hoes  Centricity Description: Osvaldo Human Qualifier: Diagnosis of  By: Shirlee Latch, MD, Dalton    . BENIGN POSITIONAL VERTIGO 03/03/2010    Qualifier: Diagnosis of  By: Alwyn Ren MD, Philippa Sicks POLYPS, HX OF 04/19/2009    Qualifier: Diagnosis of  By: Alwyn Ren MD, Chrissie Noa DIVERTICULOSIS, COLON 04/19/2009    Qualifier: Diagnosis of  By: Alwyn Ren MD, Chrissie Noa    . DYSPNEA/SHORTNESS OF BREATH 07/22/2009    Qualifier: Diagnosis of  By: Alwyn Ren MD, William    . HIATAL HERNIA 05/26/2009    Qualifier: Diagnosis of  By: Janit Bern    . Unspecified disease of the salivary glands 06/22/2007    Qualifier: Diagnosis of  By: Tawanna Cooler MD, Eugenio Hoes   . Dysrhythmia    Past Surgical History  Procedure Date  . Colonoscopy w/ polypectomy 01/26/2004    Dr.Perry  . Total abdominal hysterectomy w/ bilateral salpingoophorectomy 01/2004    For endometrial cancer ; Dr Loree Fee  . Tonsillectomy   . Laparoscopy     for 3 ovarian cyst   .  Cholecystectomy   . Fibroidectomy    History  Substance Use Topics  . Smoking status: Never Smoker   . Smokeless tobacco: Never Used  . Alcohol Use: No   Family History  Problem Relation Age of Onset  . Heart attack Mother 42  . Stroke      Grandmother (? M or P)  . Heart failure      Grandmother (?M or P)  . Sudden death Paternal Grandfather   . Atrial fibrillation Mother   . Atrial fibrillation      Grandmother (? M or P)  . Asthma Father   . Diabetes Father   . Liver disease Father     Liver transplant  . Diverticulitis Father   . Deep vein thrombosis Mother   . Lung cancer Mother   . Asthma Sister   . Prostate cancer Maternal Grandfather   . Heart attack Maternal Grandmother     Questionable  . Heart failure Maternal Grandmother     Questionable  . Deep vein thrombosis Maternal Grandmother   . Colon polyps Father   . Colon cancer Paternal Grandmother     Questionable  . Heart  failure Paternal Grandfather    Allergies  Allergen Reactions  . Rofecoxib     Ankle edema  . Advil (Ibuprofen)     Ankle edema   Current Outpatient Prescriptions on File Prior to Visit  Medication Sig Dispense Refill  . amoxicillin (AMOXIL) 500 MG capsule Take 1 capsule (500 mg total) by mouth 3 (three) times daily.  30 capsule  0  . aspirin 81 MG tablet Take 81 mg by mouth daily.        Marland Kitchen atorvastatin (LIPITOR) 20 MG tablet Take 1 tablet (20 mg total) by mouth daily.  30 tablet  3  . Flaxseed, Linseed, (FLAX SEED OIL) 1000 MG CAPS Take 1,000 mg by mouth daily.        . Magnesium 250 MG TABS Take by mouth daily.      . Multiple Vitamin (MULTIVITAMIN) tablet Take 1 tablet by mouth daily.        . vitamin C (ASCORBIC ACID) 500 MG tablet Take 500 mg by mouth daily.           Review of Systems Review of Systems  Constitutional: Negative for fever, appetite change, fatigue and unexpected weight change.  Eyes: Negative for pain and visual disturbance.  Respiratory: Negative for cough and shortness of breath.   Cardiovascular: Negative for cp or palpitations    Gastrointestinal: Negative for nausea, diarrhea and constipation.  Genitourinary: Negative for urgency and frequency.  Skin: Negative for pallor or rash   MSK pos for ongoing swollen area on L leg and also varicose veins  Neurological: Negative for weakness, light-headedness, numbness and headaches.  Hematological: Negative for adenopathy. Does not bruise/bleed easily.  Psychiatric/Behavioral: Negative for dysphoric mood. The patient is not nervous/anxious.         Objective:   Physical Exam  Constitutional: She appears well-developed and well-nourished. No distress.       obese and well appearing   HENT:  Head: Normocephalic and atraumatic.  Mouth/Throat: Oropharynx is clear and moist.  Eyes: Conjunctivae normal and EOM are normal. Pupils are equal, round, and reactive to light. Right eye exhibits no discharge. Left eye  exhibits no discharge.  Neck: Normal range of motion. Neck supple. No JVD present. No thyromegaly present.  Cardiovascular: Normal rate, regular rhythm, normal heart sounds and intact distal pulses.  Pulmonary/Chest: Effort normal and breath sounds normal. No respiratory distress. She has no wheezes. She has no rales.  Musculoskeletal: She exhibits edema and tenderness.       Swollen area on L calf- per pt is baseline for her  Very slt tender Neg homann's sign Neg for warmth or erythema Varicosities on both legs   Lymphadenopathy:    She has no cervical adenopathy.  Neurological: She is alert. She has normal reflexes. No cranial nerve deficit. She exhibits normal muscle tone. Coordination normal.  Skin: Skin is warm and dry. No rash noted. No erythema. No pallor.  Psychiatric: She has a normal mood and affect.          Assessment & Plan:

## 2011-10-08 NOTE — Assessment & Plan Note (Signed)
Acute on chronic area of swelling L calf with presumed thrombophlebitis in pt with hx of hypercoag state She does take daily asa  Now with pos D Dimer Overall exam is reassuring  Sent for venous doppler today  Suspect supp hose may be helpful in the future

## 2011-10-09 ENCOUNTER — Telehealth: Payer: Self-pay | Admitting: Internal Medicine

## 2011-10-09 NOTE — Telephone Encounter (Signed)
[facility] Triage Call Report Triage Record Num: [id number] Operator: Albertine Grates Patient Name: [name] Call Date & Time: 10/06/2011 10:17:06PM Patient Phone: [phone] PCP: Marga Melnick Patient Gender: Female PCP Fax : [phone] Patient DOB: 13-Sep-1955 Practice Name: [facility] Reason for Call: Caller: Zella Ball; PCP: Marga Melnick; CB#: [phone]; Call regarding Zella Ball is calling from [place] regarding a D dimer ordered on Raevyn, Oswald by Marga Melnick.; [facility] calling and states patient had d dimer done 9-27 and is 1.49/high. Was seen 9-27 due to swelling on lower leg. Dr. Milinda Antis notified and advised to schedule patient appointment in clinic 9-28. Patient notified, denies shortness of breath, and appointment scheduled for 0945 in [place] office. Protocol(s) Used: Office Note Recommended Outcome per Protocol: Information Noted and Sent to Office Reason for Outcome: Caller information to office Care Advice: ~

## 2011-10-09 NOTE — Telephone Encounter (Signed)
Noted, [facility]

## 2011-11-20 ENCOUNTER — Encounter: Payer: Self-pay | Admitting: Internal Medicine

## 2011-11-20 ENCOUNTER — Ambulatory Visit (INDEPENDENT_AMBULATORY_CARE_PROVIDER_SITE_OTHER): Payer: PRIVATE HEALTH INSURANCE | Admitting: Internal Medicine

## 2011-11-20 VITALS — BP 126/82 | HR 79 | Temp 98.4°F | Wt 270.0 lb

## 2011-11-20 DIAGNOSIS — E049 Nontoxic goiter, unspecified: Secondary | ICD-10-CM

## 2011-11-20 DIAGNOSIS — R9389 Abnormal findings on diagnostic imaging of other specified body structures: Secondary | ICD-10-CM

## 2011-11-20 DIAGNOSIS — R911 Solitary pulmonary nodule: Secondary | ICD-10-CM

## 2011-11-20 DIAGNOSIS — R946 Abnormal results of thyroid function studies: Secondary | ICD-10-CM

## 2011-11-20 NOTE — Progress Notes (Signed)
  Subjective:    Patient ID: [name], female    DOB: [birth year], 56 y.o.   MRN: [id number]  HPI She is concerned about the statement of possible memory  impairment on the 08/18/11 visit; she mentioned this as she read this might be one of the symptoms of hypothyroidism. Her TSH had been mildly elevated but was normal when rechecked in July of this year. The CT angiogram of 08/08/11 had suggested possibly mildly enlarged heterogenous thyroid gland.  This is also in the context of significant exogenous stress as her husband is chronically ill and hospitalized repeatedly. She also would be due for followup limited CT of 81.9 cm right lower lobe nodule. She's never smoked but was exposed to secondhand smoke.  Review of Systems  She denies shortness of breath, cough, hemoptysis, fever, chills, sweats, or unexplained weight loss.  She is due for followup of her statin therapy; she denies abdominal pain, bowel change, or myalgias.      Objective:   Physical Exam General appearance is one of good health and nourishment w/o distress.  Eyes: No conjunctival inflammation or scleral icterus is present.  Oral exam: Dental hygiene is good; lips and gums are healthy appearing.There is no oropharyngeal erythema or exudate noted.   Heart:  Normal rate and regular rhythm. S1 and S2 normal without gallop, murmur, click, rub or other extra sounds     Lungs:Chest clear to auscultation; no wheezes, rhonchi,rales ,or rubs present.No increased work of breathing.   Abdomen: bowel sounds normal, soft and non-tender without masses, organomegaly or hernias noted.  No guarding or rebound   Skin:Warm & dry.  Intact without suspicious lesions or rashes ; no jaundice or tenting  Lymphatic: No lymphadenopathy is noted about the head, neck, axilla, or inguinal areas.   MMSE: Score was perfect with no memory deficit demonstrated  Clot test: Completed correctly  Name 10 animals in 1 minute test: Completed 18  seconds              Assessment & Plan:  #1 abnormal thyroid function test, improved on followup  #2 enlarged thyroid suggested on 7/13 CT angiogram. I discussed with her that this is not a definitive test to evaluate thyroid. Rather than ultrasound would be  # 3 1.9 cm nodule in the right lower lobe;  CT scan followup  #4 questionable memory impairment in the setting of exogenous stress. No  memory deficit documented. She is fully competent with no deficit

## 2011-11-20 NOTE — Patient Instructions (Addendum)
Please  schedule fasting Labs : CK,Lipids, hepatic panel, Free T4, Free T3, TSH.PLEASE BRING THESE INSTRUCTIONS TO FOLLOW UP  LAB APPOINTMENT.This will guarantee correct labs are drawn, eliminating need for repeat blood sampling ( needle sticks ! ). Diagnoses /Codes: 794.5,272.4,995.20.

## 2011-11-22 ENCOUNTER — Other Ambulatory Visit (INDEPENDENT_AMBULATORY_CARE_PROVIDER_SITE_OTHER): Payer: PRIVATE HEALTH INSURANCE

## 2011-11-22 DIAGNOSIS — T887XXA Unspecified adverse effect of drug or medicament, initial encounter: Secondary | ICD-10-CM

## 2011-11-22 DIAGNOSIS — E785 Hyperlipidemia, unspecified: Secondary | ICD-10-CM

## 2011-11-22 LAB — LIPID PANEL
Cholesterol: 164 mg/dL (ref 0–200)
LDL Cholesterol: 93 mg/dL (ref 0–99)
Triglycerides: 100 mg/dL (ref 0.0–149.0)
VLDL: 20 mg/dL (ref 0.0–40.0)

## 2011-11-22 LAB — T4, FREE: Free T4: 0.85 ng/dL (ref 0.60–1.60)

## 2011-11-22 LAB — HEPATIC FUNCTION PANEL
AST: 24 U/L (ref 0–37)
Alkaline Phosphatase: 115 U/L (ref 39–117)
Total Bilirubin: 0.6 mg/dL (ref 0.3–1.2)

## 2011-11-22 LAB — CK: Total CK: 105 U/L (ref 7–177)

## 2011-11-22 LAB — TSH: TSH: 4.81 u[IU]/mL (ref 0.35–5.50)

## 2011-11-24 ENCOUNTER — Ambulatory Visit (INDEPENDENT_AMBULATORY_CARE_PROVIDER_SITE_OTHER)
Admission: RE | Admit: 2011-11-24 | Discharge: 2011-11-24 | Disposition: A | Discharge status: Home / Self Care | Payer: PRIVATE HEALTH INSURANCE | Source: Ambulatory Visit | Attending: Internal Medicine | Admitting: Internal Medicine

## 2011-11-24 ENCOUNTER — Other Ambulatory Visit: Payer: PRIVATE HEALTH INSURANCE

## 2011-11-24 DIAGNOSIS — R911 Solitary pulmonary nodule: Secondary | ICD-10-CM

## 2011-11-24 DIAGNOSIS — R9389 Abnormal findings on diagnostic imaging of other specified body structures: Secondary | ICD-10-CM

## 2011-11-29 ENCOUNTER — Ambulatory Visit
Admission: RE | Admit: 2011-11-29 | Discharge: 2011-11-29 | Disposition: A | Discharge status: Home / Self Care | Payer: PRIVATE HEALTH INSURANCE | Source: Ambulatory Visit | Attending: Internal Medicine | Admitting: Internal Medicine

## 2011-11-29 DIAGNOSIS — E049 Nontoxic goiter, unspecified: Secondary | ICD-10-CM

## 2011-11-29 DIAGNOSIS — R946 Abnormal results of thyroid function studies: Secondary | ICD-10-CM

## 2011-12-22 ENCOUNTER — Other Ambulatory Visit: Payer: Self-pay

## 2011-12-22 MED ORDER — NAPROXEN 500 MG PO TABS: ORAL_TABLET | ORAL | Status: DC

## 2011-12-22 NOTE — Telephone Encounter (Signed)
Medication not on med list, please advise 

## 2011-12-22 NOTE — Telephone Encounter (Signed)
RX sent

## 2011-12-22 NOTE — Telephone Encounter (Signed)
Last seen 11/20/11 and filled 06/16/11 # 60. Please advise      KP

## 2011-12-22 NOTE — Telephone Encounter (Signed)
OK # 30 bid prn

## 2011-12-25 ENCOUNTER — Other Ambulatory Visit: Payer: Self-pay | Admitting: Cardiology

## 2012-02-24 ENCOUNTER — Other Ambulatory Visit: Payer: Self-pay

## 2012-03-12 ENCOUNTER — Telehealth: Payer: Self-pay

## 2012-03-12 NOTE — Telephone Encounter (Signed)
Patient was here with her husband and mentioned that she is due for a repeat of a CT of her lungs. Dr.Hopper please advise and place order

## 2012-03-13 ENCOUNTER — Other Ambulatory Visit: Payer: Self-pay | Admitting: Internal Medicine

## 2012-03-13 DIAGNOSIS — R918 Other nonspecific abnormal finding of lung field: Secondary | ICD-10-CM

## 2012-03-13 NOTE — Telephone Encounter (Signed)
CT order was placed  

## 2012-03-29 ENCOUNTER — Ambulatory Visit (INDEPENDENT_AMBULATORY_CARE_PROVIDER_SITE_OTHER)
Admission: RE | Admit: 2012-03-29 | Discharge: 2012-03-29 | Disposition: A | Discharge status: Home / Self Care | Payer: BC Managed Care – PPO | Source: Ambulatory Visit | Attending: Internal Medicine | Admitting: Internal Medicine

## 2012-03-29 DIAGNOSIS — R918 Other nonspecific abnormal finding of lung field: Secondary | ICD-10-CM

## 2012-06-26 ENCOUNTER — Emergency Department (HOSPITAL_COMMUNITY)
Admission: EM | Admit: 2012-06-26 | Discharge: 2012-06-26 | Disposition: A | Discharge status: Home / Self Care | Payer: BC Managed Care – PPO | Attending: Emergency Medicine | Admitting: Emergency Medicine

## 2012-06-26 ENCOUNTER — Other Ambulatory Visit: Payer: Self-pay | Admitting: Physician Assistant

## 2012-06-26 ENCOUNTER — Encounter (HOSPITAL_COMMUNITY): Payer: Self-pay | Admitting: Emergency Medicine

## 2012-06-26 ENCOUNTER — Telehealth: Payer: Self-pay | Admitting: Cardiology

## 2012-06-26 ENCOUNTER — Ambulatory Visit: Payer: BC Managed Care – PPO | Admitting: Internal Medicine

## 2012-06-26 ENCOUNTER — Emergency Department (HOSPITAL_COMMUNITY): Payer: BC Managed Care – PPO

## 2012-06-26 DIAGNOSIS — E785 Hyperlipidemia, unspecified: Secondary | ICD-10-CM | POA: Insufficient documentation

## 2012-06-26 DIAGNOSIS — Z8669 Personal history of other diseases of the nervous system and sense organs: Secondary | ICD-10-CM | POA: Insufficient documentation

## 2012-06-26 DIAGNOSIS — Z8679 Personal history of other diseases of the circulatory system: Secondary | ICD-10-CM | POA: Insufficient documentation

## 2012-06-26 DIAGNOSIS — Z8701 Personal history of pneumonia (recurrent): Secondary | ICD-10-CM | POA: Insufficient documentation

## 2012-06-26 DIAGNOSIS — Z79899 Other long term (current) drug therapy: Secondary | ICD-10-CM | POA: Insufficient documentation

## 2012-06-26 DIAGNOSIS — Z8639 Personal history of other endocrine, nutritional and metabolic disease: Secondary | ICD-10-CM | POA: Insufficient documentation

## 2012-06-26 DIAGNOSIS — R1013 Epigastric pain: Secondary | ICD-10-CM | POA: Insufficient documentation

## 2012-06-26 DIAGNOSIS — R072 Precordial pain: Secondary | ICD-10-CM | POA: Insufficient documentation

## 2012-06-26 DIAGNOSIS — E669 Obesity, unspecified: Secondary | ICD-10-CM | POA: Insufficient documentation

## 2012-06-26 DIAGNOSIS — Z860100 Personal history of colon polyps, unspecified: Secondary | ICD-10-CM | POA: Insufficient documentation

## 2012-06-26 DIAGNOSIS — R079 Chest pain, unspecified: Secondary | ICD-10-CM

## 2012-06-26 DIAGNOSIS — R12 Heartburn: Secondary | ICD-10-CM | POA: Insufficient documentation

## 2012-06-26 DIAGNOSIS — Z8709 Personal history of other diseases of the respiratory system: Secondary | ICD-10-CM | POA: Insufficient documentation

## 2012-06-26 DIAGNOSIS — R109 Unspecified abdominal pain: Secondary | ICD-10-CM

## 2012-06-26 DIAGNOSIS — Z8719 Personal history of other diseases of the digestive system: Secondary | ICD-10-CM | POA: Insufficient documentation

## 2012-06-26 DIAGNOSIS — J45909 Unspecified asthma, uncomplicated: Secondary | ICD-10-CM | POA: Insufficient documentation

## 2012-06-26 DIAGNOSIS — Z8542 Personal history of malignant neoplasm of other parts of uterus: Secondary | ICD-10-CM | POA: Insufficient documentation

## 2012-06-26 DIAGNOSIS — Z7982 Long term (current) use of aspirin: Secondary | ICD-10-CM | POA: Insufficient documentation

## 2012-06-26 DIAGNOSIS — Z0289 Encounter for other administrative examinations: Secondary | ICD-10-CM

## 2012-06-26 DIAGNOSIS — Z862 Personal history of diseases of the blood and blood-forming organs and certain disorders involving the immune mechanism: Secondary | ICD-10-CM | POA: Insufficient documentation

## 2012-06-26 HISTORY — DX: Edema, unspecified: R60.9

## 2012-06-26 HISTORY — DX: Unspecified asthma, uncomplicated: J45.909

## 2012-06-26 HISTORY — DX: Nontoxic single thyroid nodule: E04.1

## 2012-06-26 HISTORY — DX: Cytomegaloviral pneumonitis: B25.0

## 2012-06-26 HISTORY — DX: Reserved for inherently not codable concepts without codable children: IMO0001

## 2012-06-26 HISTORY — DX: Other nonspecific abnormal finding of lung field: R91.8

## 2012-06-26 HISTORY — DX: Phlebitis and thrombophlebitis of unspecified site: I80.9

## 2012-06-26 LAB — CBC
MCHC: 34.5 g/dL (ref 30.0–36.0)
Platelets: 221 10*3/uL (ref 150–400)
RDW: 12.9 % (ref 11.5–15.5)

## 2012-06-26 LAB — BASIC METABOLIC PANEL
BUN: 10 mg/dL (ref 6–23)
GFR calc Af Amer: 79 mL/min — ABNORMAL LOW (ref >90)
GFR calc non Af Amer: 68 mL/min — ABNORMAL LOW (ref >90)
Potassium: 3.8 mEq/L (ref 3.5–5.1)
Sodium: 138 mEq/L (ref 135–145)

## 2012-06-26 LAB — POCT I-STAT TROPONIN I: Troponin i, poc: 0 ng/mL (ref 0.00–0.08)

## 2012-06-26 MED ORDER — CALCIUM CARBONATE ANTACID 500 MG PO CHEW
2.0000 | CHEWABLE_TABLET | Freq: Once | ORAL | Status: AC
  Administered 2012-06-26: 400 mg via ORAL
  Filled 2012-06-26: qty 2

## 2012-06-26 MED ORDER — PANTOPRAZOLE SODIUM 40 MG PO TBEC: 40.0000 mg | DELAYED_RELEASE_TABLET | Freq: Every day | ORAL | Status: DC

## 2012-06-26 NOTE — ED Provider Notes (Signed)
History     CSN: [id number]  Arrival date & time 06/26/12  1030   First MD Initiated Contact with Patient 06/26/12 1112      Chief Complaint  Patient presents with  . Chest Pain  . Heartburn    (Consider location/radiation/quality/duration/timing/severity/associated sxs/prior treatment) HPI [name] is a 57 y.o. female with a history of endometrial cancer, encephalitis, clotting disorder (not on anticoagulation) obesity and hyperlipidemia (on statin) presents emergency department complaining of chest pain.  Onset of symptoms began yesterday afternoon the discomfort in the substernal area that was associated with some anorexia.  Chest pain is not exertional, positional or pleuritic in nature  After eating a light dinner patient went to sleep however substernal chest burning pressure worsened gradually and from 1 a.m. to 5 a.m. was severe pressure associated with shortness of breath and nausea.  Patient took 3 baby aspirin and pain has been gradually getting better since and patient reports being chest pain free since 9 a.m. this morning.  Patient called her cardiologist, Dr. Jearld Pies at [facility] cardiology would by surgical the emergency.  Last cardiac stress test was 2 or 3 years ago and normal at that time.  Patient denies any current shortness of breath, dyspnea on exertion, fevers, night sweats, chills, cough, hemoptysis, abdominal pain, change in bowel movements, emesis, leg swelling.  Positive family history of ACS on both sides, age onset unknown  Past Medical History  Diagnosis Date  . 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 CMV pneumonia     CMV pneumonia 1994  . Colitis 08/2004    ?Ischemic colitis in 2006.  . Endometrial cancer     a. S/P Hysterectomy and BSO  . Obesity   . Clotting disorder     a. Anti-cardiolipin antibody syndrome (IgM anticardiolipin, B2-glycoprotein IgM). b. Has had superficial thr. phebitis 06/2011,  . Allergic rhinitis   . Colon polyps   .  Diverticulosis   . Hemorrhoids   . Palpitations     a. Holter (10/11): 9.6% of beats were atrial ectopy, average HR 78, short runs of up to 5-6 beats of probable atrial tachycardia.   . Other and unspecified hyperlipidemia 06/22/2007    Centricity Description: HYPERLIPIDEMIA Qualifier: Diagnosis of  By: Tawanna Cooler MD, Eugenio Hoes  Centricity Description: Osvaldo Human Qualifier: Diagnosis of  By: Shirlee Latch, MD, Dalton    . BENIGN POSITIONAL VERTIGO 03/03/2010    Qualifier: Diagnosis of  By: Alwyn Ren MD, Philippa Sicks POLYPS, HX OF 04/19/2009    Qualifier: Diagnosis of  By: Alwyn Ren MD, Chrissie Noa DIVERTICULOSIS, COLON 04/19/2009    Qualifier: Diagnosis of  By: Alwyn Ren MD, William    . HIATAL HERNIA 05/26/2009    Qualifier: Diagnosis of  By: Janit Bern    . Unspecified disease of the salivary glands 06/22/2007    Qualifier: Diagnosis of  By: Tawanna Cooler MD, Eugenio Hoes   . Superficial thrombophlebitis   . Edema     a. LLE post ACL sprain 2010.  Marland Kitchen Hyperlipidemia   . Reactive airway disease     a. ?RAD wheezes with RTIs.  . Normal cardiac stress test     a. Stress echo (10/11): EF 60-65%, no significant valvular abnormalities. No wall motion abnormalities with exertion, suggesting no ischemia.   . Pulmonary nodules     a. Followed periodically by CT (Last in 03/2012).  . Thyroid nodule     a. Small bilat thyroid nodule  by [place] 2013.    Past Surgical History  Procedure Laterality Date  . Colonoscopy w/ polypectomy  01/26/2004    Dr.Perry  . Total abdominal hysterectomy w/ bilateral salpingoophorectomy  01/2004    For endometrial cancer ; Dr Loree Fee  . Tonsillectomy    . Laparoscopy      for 3 ovarian cyst   . Cholecystectomy    . Fibroidectomy      Family History  Problem Relation Age of Onset  . Heart attack Mother 38  . Stroke      Grandmother (? M or P)  . Heart failure      Grandmother (?M or P)  . Sudden death Paternal Grandfather   . Atrial fibrillation Mother   .  Atrial fibrillation      Grandmother (? M or P)  . Asthma Father   . Diabetes Father   . Liver disease Father     Liver transplant  . Diverticulitis Father   . Deep vein thrombosis Mother   . Lung cancer Mother   . Asthma Sister   . Prostate cancer Maternal Grandfather   . Heart attack Maternal Grandmother     Questionable  . Heart failure Maternal Grandmother     Questionable  . Deep vein thrombosis Maternal Grandmother   . Colon polyps Father   . Colon cancer Paternal Grandmother     Questionable  . Heart failure Paternal Grandfather     History  Substance Use Topics  . Smoking status: Never Smoker   . Smokeless tobacco: Never Used  . Alcohol Use: No    OB History   Grav Para Term Preterm Abortions TAB SAB Ect Mult Living                  Review of Systems Ten systems reviewed and are negative for acute change, except as noted in the HPI.    Allergies  Rofecoxib and Advil  Home Medications   Current Outpatient Rx  Name  Route  Sig  Dispense  Refill  . amoxicillin (AMOXIL) 500 MG capsule   Oral   Take 500 mg by mouth 3 (three) times daily. As needed for Dental work         . aspirin 81 MG tablet   Oral   Take 81 mg by mouth daily.           Marland Kitchen atorvastatin (LIPITOR) 20 MG tablet      TAKE 1 TABLET (20 MG TOTAL) BY MOUTH DAILY.   30 tablet   5   . Flaxseed, Linseed, (FLAX SEED OIL) 1000 MG CAPS   Oral   Take 1,000 mg by mouth daily.           . Multiple Vitamin (MULTIVITAMIN) tablet   Oral   Take 1 tablet by mouth daily.           . naproxen (NAPROSYN) 500 MG tablet      Take 500 mg by mouth 2 (two) times daily with a meal AS NEEDED   30 tablet   0   . vitamin C (ASCORBIC ACID) 500 MG tablet   Oral   Take 500 mg by mouth daily.           BP 121/59  Pulse 71  Temp(Src) 98.8 F (37.1 C) (Oral)  Resp 19  SpO2 96%  Physical Exam  Nursing note and vitals reviewed. Constitutional: She is oriented to person, place, and time.  She appears well-developed  and well-nourished. No distress.  HENT:  Head: Normocephalic and atraumatic.  Eyes: Conjunctivae and EOM are normal. Pupils are equal, round, and reactive to light.  Neck: Normal range of motion. Neck supple. Normal carotid pulses and no JVD present. Carotid bruit is not present. No rigidity. Normal range of motion present.  Cardiovascular: Normal rate, regular rhythm, S1 normal, S2 normal, normal heart sounds, intact distal pulses and normal pulses.  Exam reveals no gallop and no friction rub.   No murmur heard. No pitting edema bilaterally, RRR, no aberrant sounds on auscultations, distal pulses intact, no carotid bruit or JVD.   Pulmonary/Chest: Effort normal and breath sounds normal. No accessory muscle usage or stridor. No respiratory distress. She exhibits no tenderness and no bony tenderness.  Abdominal: Bowel sounds are normal.  Obese, Soft non tender. Non pulsatile aorta.   Musculoskeletal: Normal range of motion.  Neurological: She is alert and oriented to person, place, and time.  Skin: Skin is warm, dry and intact. No rash noted. She is not diaphoretic. No cyanosis. Nails show no clubbing.  Psychiatric: She has a normal mood and affect. Her behavior is normal.    ED Course  Procedures (including critical care time)  Labs Reviewed  CBC - Abnormal; Notable for the following:    WBC 11.4 (*)    All other components within normal limits  BASIC METABOLIC PANEL - Abnormal; Notable for the following:    Glucose, Bld 109 (*)    GFR calc non Af Amer 68 (*)    GFR calc Af Amer 79 (*)    All other components within normal limits  POCT I-STAT TROPONIN I   Dg Chest 2 View  06/26/2012   *RADIOLOGY REPORT*  Clinical Data: Chest pain  CHEST - 2 VIEW  Comparison: 03/29/2012  Findings: The heart and pulmonary vascularity are within normal limits.  The lungs are clear bilaterally.  The known ground-glass nodules are not well evaluated on this exam.  No bony  abnormality is noted.  IMPRESSION: No acute abnormalities seen.   Original Report Authenticated By: Alcide Clever, M.D.    Date: 06/26/2012  Rate: 84  Rhythm: normal sinus rhythm  QRS Axis: normal  Intervals: normal  ST/T Wave abnormalities: normal  Conduction Disutrbances:none  Narrative Interpretation: RAE  Old EKG Reviewed: unchanged   Consult cardiology: Will see in ER, [facility] cardiology  No diagnosis found.    MDM  Pt to ER w CP concerning for angina. Cardiology to see in ED for likely CP r/o admission. Care resumed by oncoming resident. Disposition pending consult        Jaci Carrel, PA-C 06/26/12 1529

## 2012-06-26 NOTE — ED Provider Notes (Signed)
Medical screening examination/treatment/procedure(s) were performed by non-physician practitioner and as supervising physician I was immediately available for consultation/collaboration.  Dione Booze, MD 06/26/12 1537

## 2012-06-26 NOTE — H&P (Signed)
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 She presented to [facility] today with chest pain. Stress echo in 2011 was normal. Yesterday morning she ate a light breakfast and within a few hours developed an upper epigastric, lower sternal chest pain that felt like a "lump." She felt a sense of fullness. She also had a headache, so took 2 Tylenol and the pain eased off. She skipped lunch, then ate a light supper. A few hours later, she developed the same discomfort that was so uncomfortable she could not even sit at the computer. She laid down in bed but had a very restless night. The chest pain seemed to be constant almost all night. She was able to doze off a few times but the pain would wake her up. At its worst it was an 8/10. It was not worse with exertion, inspiration, or palpation. Getting up to go to the bathroom did not make a difference. She took 1 Tums with a small sip of water due to history of "acidic stomach" but instantly felt nauseated when it hit her stomach. She also felt the need to pant. No diaphoresis, palpitations or syncope. She took 2 more Tums this morning with mild relief down to a 3/10. She called our office first thing when we opened and was referred to the ER. The pain gradually subsided and she is pain free. She denies ever having symptoms like this before. The most  exertion she does is helping to clean out her mother's old house - she did so this past week without any exertional CP or SOB. In the ER, labs significant for WBC 11.4, glu 109, normal troponin. CXR without acute abnormality. She is not febrile, tachycardic, tachypnic or hypoxic. No BRBPR, melena, hematemesis. She does not take naproxen more than once every few months.  Past Medical History  Diagnosis Date  . Encephalitis age 34    with coma  . CMV pneumonia     CMV pneumonia 1994  . Colitis 08/2004    ?Ischemic colitis in 2006.  . Endometrial cancer     a. S/P Hysterectomy and BSO  . Obesity   . Clotting disorder     a. Per Dr. Alford Highland notes, sounds like antiphospholipid antibody syndrome given positive anticardiolipin antibody and positve beta-2 glycoprotetin tests. b. Has had superficial thr. phebitis 06/2011,  . Allergic rhinitis   . Colon polyps   . Diverticulosis   . Hemorrhoids   . Palpitations     a. Holter (10/11): 9.6% of beats were atrial ectopy, average HR 78, short runs of up to 5-6 beats of probable atrial tachycardia.   . Other and unspecified hyperlipidemia 06/22/2007    Centricity Description: HYPERLIPIDEMIA Qualifier: Diagnosis of  By: Tawanna Cooler MD, Eugenio Hoes  Centricity Description: Osvaldo Human Qualifier: Diagnosis of  By: Shirlee Latch, MD, Dalton    . BENIGN POSITIONAL VERTIGO 03/03/2010    Qualifier: Diagnosis of  By: Alwyn Ren MD, Chrissie Noa    .  COLONIC POLYPS, HX OF 04/19/2009    Qualifier: Diagnosis of  By: Alwyn Ren MD, Chrissie Noa    . DIVERTICULOSIS, COLON 04/19/2009    Qualifier: Diagnosis of  By: Alwyn Ren MD, William    . HIATAL HERNIA 05/26/2009    Qualifier: Diagnosis of  By: Janit Bern    . Unspecified disease of the salivary glands 06/22/2007    Qualifier: Diagnosis of  By: Tawanna Cooler MD, Eugenio Hoes   . Superficial thrombophlebitis   . Edema     a. LLE post ACL sprain 2010.  Marland Kitchen Hyperlipidemia   . Reactive airway disease     a. ?RAD wheezes with RTIs.  . Normal cardiac  stress test     a. Stress echo (10/11): EF 60-65%, no significant valvular abnormalities. No wall motion abnormalities with exertion, suggesting no ischemia.   . Pulmonary nodules     a. Followed periodically by CT (Last in 03/2012).  . Thyroid nodule     a. Small bilat thyroid nodule by [place] 2013.     Most Recent Cardiac Studies: Stress Echo 10/2009 Study Conclusions - Stress ECG conclusions: The stress ECG was negative for ischemia. - Staged echo: Normal echo stress Impressions: - Normal study after maximal exercise.   Surgical History:  Past Surgical History  Procedure Laterality Date  . Colonoscopy w/ polypectomy  01/26/2004    Dr.Perry  . Total abdominal hysterectomy w/ bilateral salpingoophorectomy  01/2004    For endometrial cancer ; Dr Loree Fee  . Tonsillectomy    . Laparoscopy      for 3 ovarian cyst   . Cholecystectomy    . Fibroidectomy       Home Meds: Prior to Admission medications   Medication Sig Start Date End Date Taking? Authorizing Provider  amoxicillin (AMOXIL) 500 MG capsule Take 500 mg by mouth 3 (three) times daily. As needed for Dental work 10/06/11  Yes Pecola Lawless, MD  aspirin 81 MG tablet Take 81 mg by mouth daily.     Yes Historical Provider, MD  atorvastatin (LIPITOR) 20 MG tablet TAKE 1 TABLET (20 MG TOTAL) BY MOUTH DAILY. 12/25/11  Yes Laurey Morale, MD  Flaxseed, Linseed, (FLAX SEED OIL) 1000 MG CAPS Take 1,000 mg by mouth daily.     Yes Historical Provider, MD  Multiple Vitamin (MULTIVITAMIN) tablet Take 1 tablet by mouth daily.     Yes Historical Provider, MD  naproxen (NAPROSYN) 500 MG tablet Take 500 mg by mouth 2 (two) times daily with a meal AS NEEDED - takes very rarely, not  12/22/11  Yes Pecola Lawless, MD  vitamin C (ASCORBIC ACID) 500 MG tablet Take 500 mg by mouth daily.   Yes Historical Provider, MD    Allergies:  Allergies  Allergen Reactions  . Rofecoxib     Ankle edema  . Advil (Ibuprofen)     Ankle edema     History   Social History  . Marital Status: Married    Spouse Name: N/A    Number of Children: N/A  . Years of Education: N/A   Occupational History  . 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 Smoking status: Never Smoker   . Smokeless tobacco: Never Used  . Alcohol Use: No  . Drug Use: No  . Sexually Active: Not on file   Other Topics Concern  . Not on file   Social History Narrative  . No narrative on file  Family History  Problem Relation Age of Onset  . Heart attack Mother 27  . Stroke      Grandmother (? M or P)  . Heart failure      Grandmother (?M or P)  . Sudden death Paternal Grandfather   . Atrial fibrillation Mother   . Atrial fibrillation      Grandmother (? M or P)  . Asthma Father   . Diabetes Father   . Liver disease Father     Liver transplant  . Diverticulitis Father   . Deep vein thrombosis Mother   . Lung cancer Mother   . Asthma Sister   . Prostate cancer Maternal Grandfather   . Heart attack Maternal Grandmother     Questionable  . Heart failure Maternal Grandmother     Questionable  . Deep vein thrombosis Maternal Grandmother   . Colon polyps Father   . Colon cancer Paternal Grandmother     Questionable  . Heart failure Paternal Grandfather     Review of Systems: General: negative for chills, fever, night sweats or weight changes.  Cardiovascular: negative for edema, orthopnea, palpitations, paroxysmal nocturnal dyspnea, shortness of breath or dyspnea on exertion Dermatological: negative for rash Respiratory: negative for cough or wheezing Urologic: negative for hematuria Abdominal: negative for nausea, vomiting, diarrhea, bright red blood per rectum, melena, or hematemesis Neurologic: negative for visual changes, syncope, or dizziness All other systems reviewed and are otherwise negative except as noted above.  Labs:   Lab Results  Component Value Date   WBC 11.4* 06/26/2012   HGB 14.0  06/26/2012   HCT 40.6 06/26/2012   MCV 90.0 06/26/2012   PLT 221 06/26/2012    Recent Labs Lab 06/26/12 1105  NA 138  K 3.8  CL 102  CO2 27  BUN 10  CREATININE 0.91  CALCIUM 9.1  GLUCOSE 109*    Lab Results  Component Value Date   CHOL 164 11/22/2011   HDL 51.10 11/22/2011   LDLCALC 93 11/22/2011   TRIG 100.0 11/22/2011     Radiology/Studies:  Dg Chest 2 View 06/26/2012   *RADIOLOGY REPORT*  Clinical Data: Chest pain  CHEST - 2 VIEW  Comparison: 03/29/2012  Findings: The heart and pulmonary vascularity are within normal limits.  The lungs are clear bilaterally.  The known ground-glass nodules are not well evaluated on this exam.  No bony abnormality is noted.  IMPRESSION: No acute abnormalities seen.   Original Report Authenticated By: Alcide Clever, M.D.     EKG: NSR 84bpm R atrial enlargement, no acute changes  Physical Exam: Blood pressure 121/59, pulse 71, temperature 98.8 F (37.1 C), temperature source Oral, resp. rate 19, SpO2 96.00%. General: Well developed, well nourished WF in no acute distress. Head: Normocephalic, atraumatic, sclera non-icteric, no xanthomas, nares are without discharge.  Neck: Negative for carotid bruits. JVD not elevated. Lungs: Clear bilaterally to auscultation without wheezes, rales, or rhonchi. Breathing is unlabored. Heart: RRR with S1 S2. No murmurs, rubs, or gallops appreciated. Abdomen: Soft, non-tender, non-distended with normoactive bowel sounds. No hepatomegaly. No rebound/guarding. No obvious abdominal masses. Msk:  Strength and tone appear normal for age. Extremities: No clubbing or cyanosis. No edema.  Distal pedal pulses are 2+ and equal bilaterally. Neuro: Alert and oriented X 3. No focal deficit. No facial asymmetry. Moves all extremities spontaneously. Psych:  Responds to questions appropriately with a normal affect.   ASSESSMENT AND PLAN:  1. Chest/epigastric pain, question gastrointestinal 2. H/o acid reflux 3.  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 Pulmonary nodules, under surveillance by PCP  Symptoms are atypical for cardiac pain. Despite constant pain since yesterday evening, troponin is negative. No evidence of ischemia by EKG. She is completely asymptomatic at present time. No clinical signs of PE. Suspect gastrointestinal etiology, question GERD vs PUD. No evidence of GI bleeding and she is currently stable. Recommend trial of Protonix 40mg  daily. Rx sent into patient's pharmacy. She should f/u with her PCP. Will also arrange followup with Dr. Shirlee Latch on 07/11/12 at 3:30pm.  Signed, Ronie Spies PA-C 06/26/2012, 3:16 PM  Patient seen and examined with Ronie Spies, PA-C. We discussed all aspects of the encounter. I agree with the assessment and plan as stated above.   Patient with episode of prolonged lower chest and epigastric pain. Now resolved spontaneously. ECG and CE completely normal. Denies exertional sx. Gallbladder removed in past. Given prolonged sx without any rise in troponin, suspect CP non-cardiac. Will start PPI and haver her f/u with Dr. Shirlee Latch as outpatient. Can consider repeat stress test versus continued medical therapy.  Regino Fournet,MD 4:41 PM

## 2012-06-26 NOTE — ED Notes (Signed)
Patient transported to X-ray 

## 2012-06-26 NOTE — ED Notes (Signed)
Pt reports pain in substernal chest and epigastric region that seemed to continue and develop into pressure in the chest later in the eve. Pt reports pain currently 1/10 and last night pain 9/10. Pt reports she took 3 baby ASA, and tums which relieved the pain along with passing of time. Pt reports SOb but no radiation of pain. Pt currently alert, oriented x4, skin color pink, warm, dry. VSS.

## 2012-06-26 NOTE — Telephone Encounter (Signed)
Spoke with [name]. [name] states she has had burning in her chest throughout the night. She is not sure whether it is indigestion or heart. I advised [name] to report to [facility] ED now. I advised [name] not to drive.

## 2012-06-26 NOTE — ED Notes (Signed)
Patient left cell phone in room.  Called and spoke with Atilano Median, patient's spouse who will be picking up the cell phone at nurse first.

## 2012-06-26 NOTE — Telephone Encounter (Signed)
New Problem  Pt states that she was having chest pain that started around 10 am and increased in strength last night with pain grade of 8 or 9.  She said that she doesn't know what caused it. She said she is not having chest pain now but feels very uncomfortable.  She said it feels like she ate too much.

## 2012-07-06 ENCOUNTER — Encounter: Payer: Self-pay | Admitting: Family Medicine

## 2012-07-06 ENCOUNTER — Ambulatory Visit (INDEPENDENT_AMBULATORY_CARE_PROVIDER_SITE_OTHER): Payer: BC Managed Care – PPO | Admitting: Family Medicine

## 2012-07-06 VITALS — BP 110/74 | Temp 98.3°F | Wt 263.0 lb

## 2012-07-06 DIAGNOSIS — J069 Acute upper respiratory infection, unspecified: Secondary | ICD-10-CM

## 2012-07-06 NOTE — Progress Notes (Signed)
  Subjective:    Patient ID: [name], female    DOB: Feb 24, 1955, 57 y.o.   MRN: [id number]  HPI Here for 2 days of stuffy head, PND, ST, and redness and burning in the right eye. No cough or fever. She does not wear contacts.    Review of Systems  Constitutional: Negative.   HENT: Positive for congestion, sore throat, rhinorrhea and postnasal drip. Negative for ear pain, sneezing and sinus pressure.   Eyes: Positive for discharge, redness and itching. Negative for photophobia, pain and visual disturbance.  Respiratory: Negative.        Objective:   Physical Exam  Constitutional: She appears well-developed and well-nourished. No distress.  HENT:  Right Ear: External ear normal.  Left Ear: External ear normal.  Nose: Nose normal.  Mouth/Throat: Oropharynx is clear and moist.  Eyes: EOM are normal. Pupils are equal, round, and reactive to light.  Right conjunctiva is pink, no DC. Left eye is clear   Neck: No thyromegaly present.  Pulmonary/Chest: Effort normal and breath sounds normal.  Lymphadenopathy:    She has no cervical adenopathy.          Assessment & Plan:  Viral URI, probably an adenovirus. Use Artificial Tears to sooth the eyes. Try Claritin and drink fluids.

## 2012-07-11 ENCOUNTER — Ambulatory Visit (INDEPENDENT_AMBULATORY_CARE_PROVIDER_SITE_OTHER): Payer: BC Managed Care – PPO | Admitting: Cardiology

## 2012-07-11 ENCOUNTER — Encounter: Payer: Self-pay | Admitting: Cardiology

## 2012-07-11 VITALS — BP 110/68 | HR 71 | Ht 67.0 in | Wt 264.0 lb

## 2012-07-11 DIAGNOSIS — R002 Palpitations: Secondary | ICD-10-CM

## 2012-07-11 DIAGNOSIS — E785 Hyperlipidemia, unspecified: Secondary | ICD-10-CM

## 2012-07-11 DIAGNOSIS — R079 Chest pain, unspecified: Secondary | ICD-10-CM

## 2012-07-11 NOTE — Patient Instructions (Addendum)
Your physician recommends that you return for a FASTING lipid profile /liver profile. This is scheduled for Wednesday July 9,2014--the lab is open from 7:30AM until 5PM. Do not eat or drink after midnight the night before you come for the lab.  Your physician wants you to follow-up in: 1 year with Dr Shirlee Latch. (July 2015).  You will receive a reminder letter in the mail two months in advance. If you don't receive a letter, please call our office to schedule the follow-up appointment.

## 2012-07-12 NOTE — Progress Notes (Signed)
Patient ID: [name], female   DOB: [birth year], 57 y.o.   MRN: [id number] PCP: Dr. Alwyn Ren  57 yo with history of antiphospholipid antibody syndrome, prior endometrial cancer, and palpitations with brief runs of atrial tachycardia as well as frequent PACs on past monitoring presents for cardiology followup.  She had a negative stress echo in 2011.   She continues to feel palpitations occasionally, usually while lying in bed at night.  They are not particularly bothersome and she does not note any long runs of tachycardia. She has not wanted to take a beta blocker.  No lightheadedness or syncope.  No exertional dyspnea.  About 2 wks ago, she developed abdominal discomfort and burning that radiated up into her chest.  This continued for about 24 hours constantly.  She went to the ER and had unremarkable ECG and negative troponin. When she got home, she vomited and the symptoms resolved.  No exertional chest pain.    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 Encephalitis with coma age 6  2. CMV pneumonia 1993  3. Colitis 2006, ? ischemic  4. Clotting disorder followed by Dr Myna Hidalgo (sounds like antiphospholipid antibody syndrome given positive anticardiolipin antibody and positve beta-2 glycoprotetin tests). ASA 81 mg once daily.  Has had superficial thrombophlebitis L leg (6/13 ultrasound).   5. Edema LLE post ACL sprain 01/2008  6. Allergic rhinitis  7. Hyperlipidemia  8. ?RAD wheezes with RTIs  9. Colonic polyps, hx of  10. Diverticulosis, colon  11. Endometrial cancer s/p hysterectomy  12. Obesity  13. Stress echo (10/11): EF 60-65%, no significant valvular abnormalities. No wall motion abnormalities with exertion, suggesting no ischemia.  14. Palpitations: Holter  (10/11): 9.6% of beats were atrial ectopy, average HR 78, short runs of up to 5-6 beats of probable atrial tachycaria.   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 Husband had liver transplant.   Review of Systems  All systems reviewed and negative except as per HPI.   Current Outpatient Prescriptions  Medication Sig Dispense Refill  . amoxicillin (AMOXIL) 500 MG capsule Take 500 mg by mouth 3 (three) times daily. As needed for Dental work      . aspirin 81 MG tablet Take 81 mg by mouth daily.        Marland Kitchen atorvastatin (LIPITOR) 20 MG tablet TAKE 1 TABLET (20 MG TOTAL) BY MOUTH DAILY.  30 tablet  5  . Flaxseed, Linseed, (FLAX SEED OIL) 1000 MG CAPS Take 1,000 mg by mouth daily.        . Multiple Vitamin (MULTIVITAMIN) tablet Take 1 tablet by mouth daily.        . naproxen (NAPROSYN) 500 MG tablet Take 500 mg by mouth 2 (two) times daily with a meal AS NEEDED  30 tablet  0  . pantoprazole (PROTONIX) 40 MG tablet Take 1 tablet (40 mg total) by mouth daily.  30 tablet  0  . vitamin C (ASCORBIC ACID) 500 MG tablet Take 500 mg by mouth daily.  No current facility-administered medications for this visit.    BP 110/68  Pulse 71  Ht 5\' 7"  (1.702 m)  Wt 264 lb (119.75 kg)  BMI 41.34 kg/m2  SpO2 98% General: NAD, overweight Neck: No JVD, no thyromegaly or thyroid nodule.  Lungs: Clear to auscultation bilaterally with normal respiratory effort. CV: Nondisplaced PMI.  Heart regular S1/S2, no S3/S4, no murmur.  No edema.  No carotid bruit.  Normal pedal pulses.  Abdomen: Soft, nontender, no hepatosplenomegaly, no distention.   Neurologic: Alert and oriented x 3.  Psych: Normal affect. Extremities: No clubbing or cyanosis.   Assessment/Plan: 1. Palpitations: Minimal/stable.  2. Hyperlipidemia:  She is on atorvastatin given elevated LDL, Lp(a), and family history of premature CAD.  Need to check lipids.  3. Chest pain: Episode recently was very atypical.  I suspect that it may have been related to gastroenteritis given the vomiting and the prominent abdominal pain/fullness.  Workup in ER was negative.  She has not had a recurrence.  I will have her continue ASA 81 and statin, I do not think that she needs to have a stress test.   Followup in 1 year if remains symptomatically stable.   Marca Ancona 07/12/2012

## 2012-07-16 ENCOUNTER — Other Ambulatory Visit: Payer: Self-pay | Admitting: Cardiology

## 2012-07-17 ENCOUNTER — Other Ambulatory Visit (INDEPENDENT_AMBULATORY_CARE_PROVIDER_SITE_OTHER): Payer: BC Managed Care – PPO

## 2012-07-17 DIAGNOSIS — E785 Hyperlipidemia, unspecified: Secondary | ICD-10-CM

## 2012-07-17 LAB — HEPATIC FUNCTION PANEL
Alkaline Phosphatase: 118 U/L — ABNORMAL HIGH (ref 39–117)
Bilirubin, Direct: 0 mg/dL (ref 0.0–0.3)
Total Protein: 6.8 g/dL (ref 6.0–8.3)

## 2012-07-17 LAB — LIPID PANEL
Cholesterol: 167 mg/dL (ref 0–200)
LDL Cholesterol: 90 mg/dL (ref 0–99)

## 2012-07-19 ENCOUNTER — Ambulatory Visit (INDEPENDENT_AMBULATORY_CARE_PROVIDER_SITE_OTHER): Payer: BC Managed Care – PPO | Admitting: Internal Medicine

## 2012-07-19 VITALS — BP 124/82 | HR 83 | Temp 98.6°F | Resp 12 | Wt 265.0 lb

## 2012-07-19 DIAGNOSIS — J019 Acute sinusitis, unspecified: Secondary | ICD-10-CM

## 2012-07-19 MED ORDER — CEFUROXIME AXETIL 500 MG PO TABS: 500.0000 mg | ORAL_TABLET | Freq: Two times a day (BID) | ORAL | Status: DC

## 2012-07-19 MED ORDER — FLUTICASONE PROPIONATE 50 MCG/ACT NA SUSP: 1.0000 | Freq: Two times a day (BID) | NASAL | Status: DC | PRN

## 2012-07-19 NOTE — Patient Instructions (Addendum)

## 2012-07-19 NOTE — Progress Notes (Signed)
  Subjective:    Patient ID: [name], female    DOB: [birth year], 57 y.o.   MRN: [id number]  HPI  Symptoms began 07/02/12 as a sore throat followed by rhinitis. She had intermittent chills; she did not measure her temperature.  Over the last 17 days the symptoms have persisted as nasal purulence with green discharge despite taking Claritin and leftover cough syrup.  Her cough began 07/18/12 it has been nonproductive.  She's had nasal congestion without definite frontal headache or facial pain. Her right eye was pink one day without purulent discharge.    Review of Systems  Extrinsic symptoms of itchy, watery eyes or sneezing have not been present. She also denies associated shortness of breath or wheezing with the cough. She's had no otic pain or discharge.     Objective:   Physical Exam General appearance:well nourished; no acute distress or increased work of breathing is present.  No  lymphadenopathy about the head, neck, or axilla noted.   Eyes: No conjunctival inflammation or lid edema is present. EOMI ; vision normal with lenses Ears:  External ear exam shows no significant lesions or deformities.  Otoscopic examination reveals clear canals, tympanic membranes are intact bilaterally without bulging, retraction, inflammation or discharge.  Nose:  External nasal examination shows no deformity or inflammation. Nasal mucosa are pink and moist without lesions or exudates. No septal dislocation or deviation.No obstruction to airflow.   Oral exam: Dental hygiene is good; lips and gums are healthy appearing.There is no oropharyngeal erythema or exudate noted.   Neck:  No deformities,  masses, or tenderness noted.     Heart:  Normal rate and regular rhythm. S1 and S2 normal without gallop, murmur, click, rub or other extra sounds.   Lungs:Chest clear to auscultation; no wheezes, rhonchi,rales ,or rubs present.No increased work of breathing.    Extremities:  No cyanosis, edema, or  clubbing  noted    Skin: Warm & dry          Assessment & Plan:  #1 rhinosinusitis without significant bronchitis  Plan: Nasal hygiene interventions discussed. See prescription medications

## 2012-07-26 ENCOUNTER — Other Ambulatory Visit: Payer: Self-pay

## 2012-07-26 DIAGNOSIS — Z1231 Encounter for screening mammogram for malignant neoplasm of breast: Secondary | ICD-10-CM

## 2012-08-17 ENCOUNTER — Other Ambulatory Visit (HOSPITAL_COMMUNITY): Payer: Self-pay | Admitting: Physician Assistant

## 2012-08-26 ENCOUNTER — Ambulatory Visit: Payer: BC Managed Care – PPO

## 2012-09-10 ENCOUNTER — Ambulatory Visit
Admission: RE | Admit: 2012-09-10 | Discharge: 2012-09-10 | Disposition: A | Discharge status: Home / Self Care | Payer: BC Managed Care – PPO | Source: Ambulatory Visit

## 2012-09-10 DIAGNOSIS — Z1231 Encounter for screening mammogram for malignant neoplasm of breast: Secondary | ICD-10-CM

## 2012-11-14 ENCOUNTER — Other Ambulatory Visit: Payer: Self-pay

## 2012-11-21 ENCOUNTER — Telehealth: Payer: Self-pay | Admitting: *Deleted

## 2012-11-21 NOTE — Telephone Encounter (Signed)
Patient called and stated that she bent over this morning and now is having back pain. She would like to be seen today. An appt was suggested with Maximino Sarin. Pt is going to call back with a time because her husband has to be available to bring her.

## 2012-12-04 ENCOUNTER — Telehealth: Payer: Self-pay | Admitting: Cardiology

## 2012-12-04 ENCOUNTER — Encounter: Payer: Self-pay | Admitting: *Deleted

## 2012-12-04 MED ORDER — METOPROLOL TARTRATE 25 MG PO TABS: ORAL_TABLET | ORAL | Status: DC

## 2012-12-04 NOTE — Telephone Encounter (Signed)
She can try metoprolol 25 mg prn palpitations.

## 2012-12-04 NOTE — Telephone Encounter (Signed)
New Problem    Pt is complaining of an erract heart beat for the pass couple days.  Pt made an appt. For next avaible but would like to be seen sooner please.

## 2012-12-04 NOTE — Telephone Encounter (Signed)
Pt advised.

## 2012-12-04 NOTE — Telephone Encounter (Signed)
Pt states for the last 3 days she has had erratic unpredictable irregular heart beat. She is not having it right now. I have given her an appt for Monday 12/09/12.

## 2012-12-04 NOTE — Telephone Encounter (Signed)
She was offered a prescription in the past to take PRN for palpitations. She is asking if she could get this now and try it until her appt 12/09/12. I will forward to Dr Shirlee Latch for review.

## 2012-12-09 ENCOUNTER — Encounter: Payer: Self-pay | Admitting: Cardiology

## 2012-12-09 ENCOUNTER — Ambulatory Visit (INDEPENDENT_AMBULATORY_CARE_PROVIDER_SITE_OTHER): Payer: BC Managed Care – PPO | Admitting: Cardiology

## 2012-12-09 ENCOUNTER — Other Ambulatory Visit: Payer: Self-pay | Admitting: *Deleted

## 2012-12-09 ENCOUNTER — Encounter: Payer: Self-pay | Admitting: *Deleted

## 2012-12-09 VITALS — BP 122/90 | HR 81 | Ht 67.0 in | Wt 269.0 lb

## 2012-12-09 DIAGNOSIS — R002 Palpitations: Secondary | ICD-10-CM

## 2012-12-09 DIAGNOSIS — R0602 Shortness of breath: Secondary | ICD-10-CM

## 2012-12-09 DIAGNOSIS — R0600 Dyspnea, unspecified: Secondary | ICD-10-CM

## 2012-12-09 DIAGNOSIS — R0609 Other forms of dyspnea: Secondary | ICD-10-CM

## 2012-12-09 DIAGNOSIS — R0989 Other specified symptoms and signs involving the circulatory and respiratory systems: Secondary | ICD-10-CM

## 2012-12-09 LAB — BRAIN NATRIURETIC PEPTIDE: Pro B Natriuretic peptide (BNP): 34 pg/mL (ref 0.0–100.0)

## 2012-12-09 LAB — TSH: TSH: 2.53 u[IU]/mL (ref 0.35–5.50)

## 2012-12-09 LAB — BASIC METABOLIC PANEL
BUN: 14 mg/dL (ref 6–23)
CO2: 25 mEq/L (ref 19–32)
Chloride: 106 mEq/L (ref 96–112)
GFR: 67.15 mL/min (ref >60.00)
Potassium: 4 mEq/L (ref 3.5–5.1)
Sodium: 141 mEq/L (ref 135–145)

## 2012-12-09 LAB — MAGNESIUM: Magnesium: 2.1 mg/dL (ref 1.5–2.5)

## 2012-12-09 MED ORDER — METOPROLOL TARTRATE 25 MG PO TABS: 25.0000 mg | ORAL_TABLET | Freq: Two times a day (BID) | ORAL | Status: DC

## 2012-12-09 NOTE — Patient Instructions (Signed)
Take metoprolol 25mg  two times a day.  Your physician recommends that you have  lab work today--TSH/BNP/BMET/Magnesium level.    Drink decaffeinated coffee, decrease caffeine in your diet.   Your physician has requested that you have an echocardiogram. Echocardiography is a painless test that uses sound waves to create images of your heart. It provides your doctor with information about the size and shape of your heart and how well your heart's chambers and valves are working. This procedure takes approximately one hour. There are no restrictions for this procedure.  Your physician has recommended that you wear an event monitor. Event monitors are medical devices that record the heart's electrical activity. Doctors most often [place] these monitors to diagnose arrhythmias. Arrhythmias are problems with the speed or rhythm of the heartbeat. The monitor is a small, portable device. You can wear one while you do your normal daily activities. This is usually used to diagnose what is causing palpitations/syncope (passing out). 30 day monitor     Your physician recommends that you schedule a follow-up appointment in: 5-6 weeks with Dr Shirlee Latch after the 30 day monitor has been completed.

## 2012-12-10 ENCOUNTER — Encounter: Payer: Self-pay | Admitting: *Deleted

## 2012-12-10 ENCOUNTER — Encounter (INDEPENDENT_AMBULATORY_CARE_PROVIDER_SITE_OTHER): Payer: BC Managed Care – PPO

## 2012-12-10 DIAGNOSIS — R0609 Other forms of dyspnea: Secondary | ICD-10-CM | POA: Insufficient documentation

## 2012-12-10 DIAGNOSIS — R0602 Shortness of breath: Secondary | ICD-10-CM

## 2012-12-10 DIAGNOSIS — R0600 Dyspnea, unspecified: Secondary | ICD-10-CM | POA: Insufficient documentation

## 2012-12-10 DIAGNOSIS — R002 Palpitations: Secondary | ICD-10-CM

## 2012-12-10 NOTE — Progress Notes (Signed)
Patient ID: [name], female   DOB: 1955/05/02, 57 y.o.   MRN: [id number] [employer] 30 day cardiac event monitor applied to patient.

## 2012-12-10 NOTE — Progress Notes (Signed)
Patient ID: [name], female   DOB: May 05, 1955, 57 y.o.   MRN: [id number] PCP: Dr. Alwyn Ren  57 yo with history of antiphospholipid antibody syndrome, prior endometrial cancer, and palpitations with brief runs of atrial tachycardia as well as frequent PACs on past monitoring presents for cardiology followup.  She had a negative stress echo in 2011.     Starting in 11/14, she has been having more frequent palpitations.  She will feel her heart fluttering for up to 20 minutes at a time.  Symptoms occur daily now.  She is occasionally dizzy with palpitation episodes but she has not fallen or passed out.  She feels like her heart beats irregularly.  Also over the last month, she has noted shortness of breath after walking for about 100 feet or if she is having palpitations.  No stroke-like symptoms.  No chest pain.  She drinks 2 cups of coffee daily, no change.  She has been having significant low back pain recently from degenerative disc disease.  Weight is up 5 lbs.    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 Encephalitis with coma age 36  2. CMV pneumonia 1993  3. Colitis 2006, ? ischemic  4. Clotting disorder followed by Dr Myna Hidalgo (sounds like antiphospholipid antibody syndrome given positive anticardiolipin antibody and positve beta-2 glycoprotetin tests). ASA 81 mg once daily.  Has had superficial thrombophlebitis L leg (6/13 ultrasound).   5. Edema LLE post ACL sprain 01/2008  6. Allergic rhinitis  7. Hyperlipidemia  8. ?RAD wheezes with RTIs  9. Colonic polyps, hx of  10. Diverticulosis, colon  11. Endometrial cancer s/p hysterectomy  12. Obesity  13. Stress echo (10/11): EF 60-65%, no significant valvular abnormalities. No wall motion  abnormalities with exertion, suggesting no ischemia.  14. Palpitations: Holter (10/11): 9.6% of beats were atrial ectopy, average HR 78, short runs of up to 5-6 beats of probable atrial tachycaria.   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 Husband had liver transplant.   Review of Systems  All systems reviewed and negative except as per HPI.   Current Outpatient Prescriptions  Medication Sig Dispense Refill  . amoxicillin (AMOXIL) 500 MG capsule Take 500 mg by mouth 3 (three) times daily. As needed for Dental work      . aspirin 81 MG tablet Take 81 mg by mouth daily.        Marland Kitchen atorvastatin (LIPITOR) 20 MG tablet TAKE 1 TABLET (20 MG TOTAL) BY MOUTH DAILY.  30 tablet  11  . Flaxseed, Linseed, (FLAX SEED OIL) 1000 MG CAPS Take 1,000 mg by mouth daily.        . Multiple Vitamin (MULTIVITAMIN) tablet Take 1 tablet by mouth daily.        . naproxen (NAPROSYN) 500 MG tablet Take 500 mg by mouth 2 (two) times daily with a meal AS NEEDED  30 tablet  0  . vitamin C (ASCORBIC ACID) 500 MG tablet Take 500 mg by  mouth daily.      . metoprolol tartrate (LOPRESSOR) 25 MG tablet Take 1 tablet (25 mg total) by mouth 2 (two) times daily.  60 tablet  3   No current facility-administered medications for this visit.    BP 122/90  Pulse 81  Ht 5\' 7"  (1.702 m)  Wt 269 lb (122.018 kg)  BMI 42.12 kg/m2 General: NAD, overweight Neck: No JVD, no thyromegaly or thyroid nodule.  Lungs: Clear to auscultation bilaterally with normal respiratory effort. CV: Nondisplaced PMI.  Heart regular S1/S2, no S3/S4, no murmur.  1+ ankle edema.  No carotid bruit.  Normal pedal pulses.  Abdomen: Soft, nontender, no hepatosplenomegaly, no distention.   Neurologic: Alert and oriented x 3.  Psych: Normal affect. Extremities: No  clubbing or cyanosis.   Assessment/Plan: 1. Palpitations: Worse for 1 month, possibly corresponding to increased back pain.  She has palpitations daily, occasionally they make her dizzy.  - Cut back on caffeine - Start metoprolol 25 mg bid as the symptoms are quite bothersome.  - I will get a 30 day event monitor, primary interest is to see if she has paroxysmal atrial fibrillation.  - TSH 2. Hyperlipidemia: She is on atorvastatin given elevated LDL, Lp(a), and family history of premature CAD.  Good lipids in 7/14.  3. Exertional dyspnea: More significant for about 1 month. I will arrange for an echocardiogram and check BNP.   Followup after monitor is completed.  Marca Ancona 12/10/2012

## 2012-12-25 ENCOUNTER — Ambulatory Visit (HOSPITAL_COMMUNITY): Payer: BC Managed Care – PPO | Attending: Internal Medicine | Admitting: Cardiology

## 2012-12-25 ENCOUNTER — Encounter: Payer: Self-pay | Admitting: *Deleted

## 2012-12-25 DIAGNOSIS — E785 Hyperlipidemia, unspecified: Secondary | ICD-10-CM | POA: Insufficient documentation

## 2012-12-25 DIAGNOSIS — R0989 Other specified symptoms and signs involving the circulatory and respiratory systems: Secondary | ICD-10-CM | POA: Insufficient documentation

## 2012-12-25 DIAGNOSIS — R002 Palpitations: Secondary | ICD-10-CM

## 2012-12-25 DIAGNOSIS — R0602 Shortness of breath: Secondary | ICD-10-CM

## 2012-12-25 DIAGNOSIS — R0609 Other forms of dyspnea: Secondary | ICD-10-CM | POA: Insufficient documentation

## 2012-12-25 NOTE — Progress Notes (Signed)
Echo performed. 

## 2012-12-30 ENCOUNTER — Telehealth: Payer: Self-pay | Admitting: Cardiology

## 2012-12-30 NOTE — Telephone Encounter (Signed)
Pt aware of results of 2 D Echo

## 2012-12-30 NOTE — Telephone Encounter (Signed)
Follow Up  ° °Pt returned call for results °

## 2013-01-15 ENCOUNTER — Ambulatory Visit: Payer: BC Managed Care – PPO | Admitting: Cardiology

## 2013-01-23 ENCOUNTER — Telehealth: Payer: Self-pay | Admitting: *Deleted

## 2013-01-23 NOTE — Telephone Encounter (Signed)
Dr Aundra Dubin reviewed monitor done 12/10/12-01/17/13  Short runs SVT rarely O/W OK. No atrial fib. If she feels better on metoprolol, continue.    Discussed with patient. Pt states she is feeling better on metoprolol, will continue.

## 2013-02-13 ENCOUNTER — Ambulatory Visit (INDEPENDENT_AMBULATORY_CARE_PROVIDER_SITE_OTHER): Payer: BC Managed Care – PPO | Admitting: Cardiology

## 2013-02-13 ENCOUNTER — Encounter: Payer: Self-pay | Admitting: Cardiology

## 2013-02-13 VITALS — BP 118/70 | HR 60 | Ht 67.0 in | Wt 275.0 lb

## 2013-02-13 DIAGNOSIS — R002 Palpitations: Secondary | ICD-10-CM

## 2013-02-13 DIAGNOSIS — E785 Hyperlipidemia, unspecified: Secondary | ICD-10-CM

## 2013-02-13 NOTE — Patient Instructions (Addendum)
Your physician wants you to follow-up in: 1 year with Dr Aundra Dubin. (February 2016).  You will receive a reminder letter in the mail two months in advance. If you don't receive a letter, please call our office to schedule the follow-up appointment.   Your physician recommends that you return for a FASTING lipid profile /liver profile -July 2015

## 2013-02-14 NOTE — Progress Notes (Signed)
Patient ID: [name], female   DOB: 05-28-1955, 58 y.o.   MRN: [id number] PCP: Dr. Linna Darner  58 yo with history of antiphospholipid antibody syndrome, prior endometrial cancer, and palpitations with brief runs of atrial tachycardia as well as frequent PACs on past monitoring presents for cardiology followup.  She had a negative stress echo in 2011.   3-week monitor in 12/14 (due to palpitations) showed short runs of SVT, no atrial fibrillation.  Echo showed EF 55-60% and did not have significant abnormality.   Since last appointment, palpitations have subsided.  She is now taking metoprolol 25 mg bid. No chest pain.  Dyspnea walking up a hill. Weight continues to rise, up another 6 lbs.   Labs (7/11): HCT 40.6, K 4.3, creatinine 0.6, TSH normal, free T4 normal  Labs (10/11): K 4.8, creatinine 1.1, LFTs normal, LDL 158, HDL 58  Labs (1/12): LDL 150, Lp(a) 104 Labs (11/13): LDL 93, HDL 51 Labs (6/14): K 3.8, creatinine 0.91 Labs (7/14): LDL 90, HDL 50 Labs (12/14): K 4, creatinine 0.9, BNP 34, TSH normal  Past Medical History:  1. Encephalitis with coma age 3  2. CMV pneumonia 1993  3. Colitis 2006, ? ischemic  4. Clotting disorder followed by Dr Marin Olp (sounds like antiphospholipid antibody syndrome given positive anticardiolipin antibody and positve beta-2 glycoprotetin tests). ASA 81 mg once daily.  Has had superficial thrombophlebitis L leg (6/13 ultrasound).   5. Edema LLE post ACL sprain 01/2008  6. Allergic rhinitis  7. Hyperlipidemia  8. ?RAD wheezes with RTIs  9. Colonic polyps, hx of  10. Diverticulosis, colon  11. Endometrial cancer s/p hysterectomy  12. Obesity  13. Stress echo (10/11): EF 60-65%, no significant valvular abnormalities. No wall motion abnormalities with exertion, suggesting no ischemia.  Echo (12/14) with EF 55-60%, no significant abnormalities.  14. Palpitations: Holter (10/11): 9.6% of beats were atrial ectopy, average HR 78, short runs of up to 5-6 beats  of probable atrial tachycaria.  3 week event monitor (12/14) showed short runs of SVT rarely, no atrial fibrillation.   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 Husband had liver transplant.   Current Outpatient Prescriptions  Medication Sig Dispense Refill  . amoxicillin (AMOXIL) 500 MG capsule Take 500 mg by mouth 3 (three) times daily. As needed for Dental work      . aspirin 81 MG tablet Take 81 mg by mouth daily.        Marland Kitchen atorvastatin (LIPITOR) 20 MG tablet TAKE 1 TABLET (20 MG TOTAL) BY MOUTH DAILY.  30 tablet  11  . Flaxseed, Linseed, (FLAX SEED OIL) 1000 MG CAPS Take 1,000 mg by mouth daily.        . metoprolol tartrate (LOPRESSOR) 25 MG tablet Take 1 tablet (25 mg total) by mouth 2 (two) times daily.  60 tablet  3  . Multiple Vitamin (MULTIVITAMIN) tablet Take 1 tablet by mouth daily.        . vitamin C (ASCORBIC ACID) 500 MG tablet Take 500 mg by mouth daily.       No current facility-administered medications for this visit.    BP 118/70  Pulse 60  Ht 5\' 7"  (1.702 m)  Wt 124.739 kg (275 lb)  BMI 43.06 kg/m2 General: NAD,  overweight Neck: No JVD, no thyromegaly or thyroid nodule.  Lungs: Clear to auscultation bilaterally with normal respiratory effort. CV: Nondisplaced PMI.  Heart regular S1/S2, no S3/S4, no murmur.  1+ ankle edema.  No carotid bruit.  Normal pedal pulses.  Abdomen: Soft, nontender, no hepatosplenomegaly, no distention.   Neurologic: Alert and oriented x 3.  Psych: Normal affect. Extremities: No clubbing or cyanosis.   Assessment/Plan: 1. Palpitations: Resolved on metoprolol.  Event monitor showed short runs of SVT, no atrial fibrillation.  Echo showed normal EF and no significant valvular abnormalities.  2. Hyperlipidemia: She is on atorvastatin given elevated  LDL, Lp(a), and family history of premature CAD.  Good lipids in 7/14.  3. Obesity: I strongly encouraged diet and exercise for weight loss. BMI is now up to 43.    Loralie Champagne 02/14/2013

## 2013-04-20 ENCOUNTER — Other Ambulatory Visit: Payer: Self-pay | Admitting: Cardiology

## 2013-06-01 ENCOUNTER — Other Ambulatory Visit: Payer: Self-pay | Admitting: Cardiology

## 2013-06-12 ENCOUNTER — Telehealth: Payer: Self-pay | Admitting: Cardiology

## 2013-06-12 NOTE — Telephone Encounter (Signed)
That would be ok to try.  Would be surprised though if the numbness is really from metoprolol.

## 2013-06-12 NOTE — Telephone Encounter (Signed)
Called patient back. Her chief complaint is numbness in both hands on and off for several weeks. Thinks it is related to Metoprolol use. She is currently taking Metoprolol 25mg  BID. States that Dr.McLean advised her in the past that she could wean off Metoprolol since her monitor showed no atrial fib. Advised her if she does wean she needs to go down to Metoprolol 12.5mg   BID times 1 week then 12.5mg  for a week then DC. Will ask Dr.McLean if he agrees with this plan and call her back.

## 2013-06-12 NOTE — Telephone Encounter (Signed)
Called patient on cell phone at [phone] and advised OK to wean off Metoprolol per Dr.McLean and gave her instructions as previously stated. She will let us know how she feels in a few weeks.

## 2013-06-12 NOTE — Telephone Encounter (Signed)
New message         Pt is having numbing in her hands / pt said dr Aundra Dubin said she could come off of the medicine metoprolol slowly / could this be the reason for the numbness?

## 2013-07-12 ENCOUNTER — Emergency Department (HOSPITAL_COMMUNITY)
Admission: EM | Admit: 2013-07-12 | Discharge: 2013-07-13 | Disposition: A | Discharge status: Home / Self Care | Payer: BC Managed Care – PPO | Attending: Emergency Medicine | Admitting: Emergency Medicine

## 2013-07-12 ENCOUNTER — Encounter (HOSPITAL_COMMUNITY): Payer: Self-pay | Admitting: Emergency Medicine

## 2013-07-12 DIAGNOSIS — R911 Solitary pulmonary nodule: Secondary | ICD-10-CM

## 2013-07-12 DIAGNOSIS — Z9089 Acquired absence of other organs: Secondary | ICD-10-CM | POA: Insufficient documentation

## 2013-07-12 DIAGNOSIS — Z8679 Personal history of other diseases of the circulatory system: Secondary | ICD-10-CM | POA: Insufficient documentation

## 2013-07-12 DIAGNOSIS — Z792 Long term (current) use of antibiotics: Secondary | ICD-10-CM | POA: Insufficient documentation

## 2013-07-12 DIAGNOSIS — Z8542 Personal history of malignant neoplasm of other parts of uterus: Secondary | ICD-10-CM | POA: Insufficient documentation

## 2013-07-12 DIAGNOSIS — Z7982 Long term (current) use of aspirin: Secondary | ICD-10-CM | POA: Insufficient documentation

## 2013-07-12 DIAGNOSIS — R109 Unspecified abdominal pain: Secondary | ICD-10-CM | POA: Insufficient documentation

## 2013-07-12 DIAGNOSIS — Z90710 Acquired absence of both cervix and uterus: Secondary | ICD-10-CM | POA: Insufficient documentation

## 2013-07-12 DIAGNOSIS — D689 Coagulation defect, unspecified: Secondary | ICD-10-CM

## 2013-07-12 DIAGNOSIS — E785 Hyperlipidemia, unspecified: Secondary | ICD-10-CM | POA: Insufficient documentation

## 2013-07-12 DIAGNOSIS — Z8669 Personal history of other diseases of the nervous system and sense organs: Secondary | ICD-10-CM | POA: Insufficient documentation

## 2013-07-12 DIAGNOSIS — Z860100 Personal history of colon polyps, unspecified: Secondary | ICD-10-CM | POA: Insufficient documentation

## 2013-07-12 DIAGNOSIS — Z9889 Other specified postprocedural states: Secondary | ICD-10-CM | POA: Insufficient documentation

## 2013-07-12 DIAGNOSIS — Z8719 Personal history of other diseases of the digestive system: Secondary | ICD-10-CM | POA: Insufficient documentation

## 2013-07-12 DIAGNOSIS — E669 Obesity, unspecified: Secondary | ICD-10-CM | POA: Insufficient documentation

## 2013-07-12 DIAGNOSIS — J45909 Unspecified asthma, uncomplicated: Secondary | ICD-10-CM | POA: Insufficient documentation

## 2013-07-12 DIAGNOSIS — Z8701 Personal history of pneumonia (recurrent): Secondary | ICD-10-CM | POA: Insufficient documentation

## 2013-07-12 NOTE — ED Provider Notes (Signed)
CSN: [id number]     Arrival date & time 07/12/13  2214 History   First MD Initiated Contact with Patient 07/12/13 2306     Chief Complaint  Patient presents with  . Flank Pain     (Consider location/radiation/quality/duration/timing/severity/associated sxs/prior Treatment) The history is provided by the patient and medical records. No language interpreter was used.    [name] is a 58 y.o. female  with a hx of colitis, Anti-cardiolipin antibody syndrome (IgM anticardiolipin, B2-glycoprotein IgM) presents to the Emergency Department complaining of gradual, persistent, progressively worsening right flank pain onset 12:30PM.  Pt reports pain is better at rest but rises to a 10/10 with movement and deep breathing. Pt reports 6 hour car ride 5 days ago, but denies new or increased swelling in her legs.  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  Pt denies new activity, falls or reasons for her back to hurt.  Pt denies HA, CP, SOB, abd pain, N/V/D, weakness, dizziness, syncope, dysuria, hematuria.      Past Medical History  Diagnosis Date  . 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 CMV pneumonia     CMV pneumonia 1994  . Colitis 08/2004    ?Ischemic colitis in 2006.  . Endometrial cancer     a. S/P Hysterectomy and BSO  . Obesity   . Clotting disorder     a. Anti-cardiolipin antibody syndrome (IgM anticardiolipin, B2-glycoprotein IgM). b. Has had superficial thr. phebitis 06/2011,  . Allergic rhinitis   . Colon polyps   . Diverticulosis     Has had diverticulitis x 2  . Hemorrhoids   . Palpitations     a. Holter (10/11): 9.6% of beats were atrial ectopy, average HR 78, short runs of up to 5-6 beats of probable atrial tachycardia.   . Other and unspecified hyperlipidemia 06/22/2007  . BENIGN POSITIONAL VERTIGO 03/03/2010  . HIATAL HERNIA 05/26/2009  . 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 Superficial thrombophlebitis   . Edema      a. LLE post ACL sprain 2010.  Marland Kitchen Reactive airway disease     a. ?RAD wheezes with respiratory infections.  . Normal cardiac stress test     a. Stress echo (10/11): EF 60-65%, no significant valvular abnormalities. No wall motion abnormalities with exertion, suggesting no ischemia.   . Pulmonary nodules     a. Followed periodically by CT (Last in 03/2012).  . Thyroid nodule     a. Small bilat thyroid nodule by [place] 2013, periodically followed.   Past Surgical History  Procedure Laterality Date  . Colonoscopy w/ polypectomy  01/26/2004    Dr.Perry  . Total abdominal hysterectomy w/ bilateral salpingoophorectomy  01/2004    For endometrial cancer ; Dr Aldean Ast  . Tonsillectomy    . Laparoscopy      for 3 ovarian cyst   . Cholecystectomy    . Fibroidectomy     Family History  Problem Relation Age of Onset  . Heart attack Mother 8  . Stroke      Grandmother (? M or P)  . Heart failure      Grandmother (?M or P)  . Sudden death Paternal Grandfather   . Atrial fibrillation Mother   . Atrial fibrillation      Grandmother (? M or P)  . Asthma Father   . Diabetes Father   . Liver disease Father  Liver transplant  . Diverticulitis Father   . Deep vein thrombosis Mother   . Lung cancer Mother   . Asthma Sister   . Prostate cancer Maternal Grandfather   . Heart attack Maternal Grandmother     Questionable  . Heart failure Maternal Grandmother     Questionable  . Deep vein thrombosis Maternal Grandmother   . Colon polyps Father   . Colon cancer Paternal Grandmother     Questionable  . Heart failure Paternal Grandfather    History  Substance Use Topics  . Smoking status: Never Smoker   . Smokeless tobacco: Never Used  . Alcohol Use: No   OB History   Grav Para Term Preterm Abortions TAB SAB Ect Mult Living                 Review of Systems  Constitutional: Negative for fever, diaphoresis, appetite change, fatigue and unexpected weight change.  HENT:  Negative for mouth sores.   Eyes: Negative for visual disturbance.  Respiratory: Negative for cough, chest tightness, shortness of breath and wheezing.   Cardiovascular: Negative for chest pain.  Gastrointestinal: Negative for nausea, vomiting, abdominal pain, diarrhea and constipation.  Endocrine: Negative for polydipsia, polyphagia and polyuria.  Genitourinary: Positive for flank pain (right). Negative for dysuria, urgency, frequency and hematuria.  Musculoskeletal: Negative for back pain and neck stiffness.  Skin: Negative for rash.  Allergic/Immunologic: Negative for immunocompromised state.  Neurological: Negative for syncope, light-headedness and headaches.  Hematological: Does not bruise/bleed easily.  Psychiatric/Behavioral: Negative for sleep disturbance. The patient is not nervous/anxious.       Allergies  Rofecoxib; Sulfa antibiotics; and Advil  Home Medications   Prior to Admission medications   Medication Sig Start Date End Date Taking? Authorizing Provider  aspirin 81 MG tablet Take 81 mg by mouth daily.     Yes Historical Provider, MD  atorvastatin (LIPITOR) 20 MG tablet TAKE 1 TABLET (20 MG TOTAL) BY MOUTH DAILY. 07/16/12  Yes Larey Dresser, MD  Flaxseed, Linseed, (FLAX SEED OIL) 1000 MG CAPS Take 1,000 mg by mouth daily.     Yes Historical Provider, MD  Magnesium 250 MG TABS Take 1 tablet by mouth daily.    Yes Historical Provider, MD  Multiple Vitamin (MULTIVITAMIN) tablet Take 1 tablet by mouth daily.     Yes Historical Provider, MD  amoxicillin (AMOXIL) 500 MG capsule Take 500 mg by mouth 3 (three) times daily. As needed for Dental work 10/06/11   Hendricks Limes, MD   BP 137/78  Pulse 81  Temp(Src) 98.4 F (36.9 C) (Oral)  Resp 18  SpO2 100% Physical Exam  Nursing note and vitals reviewed. Constitutional: She is oriented to person, place, and time. She appears well-developed and well-nourished. No distress.  HENT:  Head: Normocephalic and atraumatic.   Mouth/Throat: Oropharynx is clear and moist. No oropharyngeal exudate.  Cardiovascular: Normal rate, regular rhythm, normal heart sounds and intact distal pulses.   No murmur heard. No tachycardia  Pulmonary/Chest: Effort normal and breath sounds normal. No respiratory distress. She has no wheezes.  Clear and equal breath sounds   Abdominal: Soft. Bowel sounds are normal. She exhibits no distension and no mass. There is no tenderness. There is CVA tenderness (right). There is no rebound and no guarding.  Obese abd; soft and nontender  Musculoskeletal: Normal range of motion. She exhibits no edema.  Pain to palpation of the right mid t-spine Full ROM of the T-spine with pain  Neurological: She  is alert and oriented to person, place, and time. She exhibits normal muscle tone. Coordination normal.  Skin: Skin is warm and dry. No rash noted. She is not diaphoretic.  Psychiatric: She has a normal mood and affect.    ED Course  Procedures (including critical care time) Labs Review Labs Reviewed  COMPREHENSIVE METABOLIC PANEL - Abnormal; Notable for the following:    Glucose, Bld 111 (*)    Alkaline Phosphatase 157 (*)    GFR calc non Af Amer 59 (*)    GFR calc Af Amer 68 (*)    All other components within normal limits  CBC  URINALYSIS, ROUTINE W REFLEX MICROSCOPIC  D-DIMER, QUANTITATIVE  I-STAT CG4 LACTIC ACID, ED    Imaging Review No results found.   EKG Interpretation None      MDM   Final diagnoses:  Flank pain  Other and unspecified coagulation defects   Moncerrath M Hodgman presents with right flank pain without urinary ssx or abd ssx.  Pt with hx of clotting disorder and long car trip 5 days ago. Pt reports taking ASA for her disorder, but no anticoagulants.  Will obtain urine, bloodwork, lactic acid and renal US.  Doubt PE at this time, but renal thrombus is a possibility.    12:40 AM CBC and lactic acid WNL.  NO evidence of mesenteric ischemia.    1:30 AM UA  without evidence of hgb and all other labs are WNL.  Pt's pain is reproducible, but she has risk factors for clotting.  Discussed with Dr. Vivi Martens who recommends d-dimer.  The patient was discussed with and seen by Dr. Zenia Resides who agrees with the treatment plan; he will follow her d-dimer and dispo accordingly.  ECG pending.   Jarrett Soho Amiere Cawley, PA-C 07/13/13 [phone]

## 2013-07-12 NOTE — ED Notes (Addendum)
Pt arrived to the ED with a complaint of right sided flank pain.  Pt states the pain has no radiation.  Pt  Describes the pain as a stabbing sensation.  Pain has been present since 1230 hrs Saturday.  Pt denies painful or difficult urination.  Pt has an anticoagulation disease

## 2013-07-13 ENCOUNTER — Emergency Department (HOSPITAL_COMMUNITY): Payer: BC Managed Care – PPO

## 2013-07-13 LAB — URINALYSIS, ROUTINE W REFLEX MICROSCOPIC
Bilirubin Urine: NEGATIVE
Glucose, UA: NEGATIVE mg/dL
HGB URINE DIPSTICK: NEGATIVE
Ketones, ur: NEGATIVE mg/dL
Leukocytes, UA: NEGATIVE
Nitrite: NEGATIVE
PH: 5.5 (ref 5.0–8.0)
Protein, ur: NEGATIVE mg/dL
SPECIFIC GRAVITY, URINE: 1.025 (ref 1.005–1.030)
Urobilinogen, UA: 0.2 mg/dL (ref 0.0–1.0)

## 2013-07-13 LAB — COMPREHENSIVE METABOLIC PANEL
ALBUMIN: 3.5 g/dL (ref 3.5–5.2)
ALT: 27 U/L (ref 0–35)
AST: 29 U/L (ref 0–37)
Alkaline Phosphatase: 157 U/L — ABNORMAL HIGH (ref 39–117)
Anion gap: 13 (ref 5–15)
BUN: 14 mg/dL (ref 6–23)
CO2: 25 mEq/L (ref 19–32)
Calcium: 9.5 mg/dL (ref 8.4–10.5)
Chloride: 103 mEq/L (ref 96–112)
Creatinine, Ser: 1.02 mg/dL (ref 0.50–1.10)
GFR calc Af Amer: 68 mL/min — ABNORMAL LOW (ref >90)
GFR calc non Af Amer: 59 mL/min — ABNORMAL LOW (ref >90)
Glucose, Bld: 111 mg/dL — ABNORMAL HIGH (ref 70–99)
Potassium: 4.4 mEq/L (ref 3.7–5.3)
SODIUM: 141 meq/L (ref 137–147)
TOTAL PROTEIN: 7.1 g/dL (ref 6.0–8.3)
Total Bilirubin: 0.4 mg/dL (ref 0.3–1.2)

## 2013-07-13 LAB — CBC
HCT: 41.2 % (ref 36.0–46.0)
HEMOGLOBIN: 13.8 g/dL (ref 12.0–15.0)
MCH: 30.9 pg (ref 26.0–34.0)
MCHC: 33.5 g/dL (ref 30.0–36.0)
MCV: 92.2 fL (ref 78.0–100.0)
Platelets: 170 10*3/uL (ref 150–400)
RBC: 4.47 MIL/uL (ref 3.87–5.11)
RDW: 13.3 % (ref 11.5–15.5)
WBC: 7.5 10*3/uL (ref 4.0–10.5)

## 2013-07-13 LAB — I-STAT CG4 LACTIC ACID, ED: Lactic Acid, Venous: 1.09 mmol/L (ref 0.5–2.2)

## 2013-07-13 LAB — D-DIMER, QUANTITATIVE (NOT AT ARMC): D DIMER QUANT: 1.35 ug{FEU}/mL — AB (ref 0.00–0.48)

## 2013-07-13 MED ORDER — IOHEXOL 350 MG/ML SOLN
100.0000 mL | Freq: Once | INTRAVENOUS | Status: AC | PRN
  Administered 2013-07-13: 100 mL via INTRAVENOUS

## 2013-07-13 MED ORDER — METAXALONE 800 MG PO TABS: 800.0000 mg | ORAL_TABLET | Freq: Three times a day (TID) | ORAL | Status: DC

## 2013-07-13 MED ORDER — OXYCODONE-ACETAMINOPHEN 5-325 MG PO TABS
1.0000 | ORAL_TABLET | Freq: Once | ORAL | Status: AC
  Administered 2013-07-13: 1 via ORAL
  Filled 2013-07-13: qty 1

## 2013-07-13 MED ORDER — OXYCODONE-ACETAMINOPHEN 5-325 MG PO TABS: 1.0000 | ORAL_TABLET | ORAL | Status: DC | PRN

## 2013-07-13 NOTE — ED Notes (Signed)
US at bedside

## 2013-07-13 NOTE — Discharge Instructions (Signed)
You need to call your primary care Dr. Monday to schedule a PET CAT scan of your chest to make sure that you do not have cancer. Muscle Strain A muscle strain is an injury that occurs when a muscle is stretched beyond its normal length. Usually a small number of muscle fibers are torn when this happens. Muscle strain is rated in degrees. First-degree strains have the least amount of muscle fiber tearing and pain. Second-degree and third-degree strains have increasingly more tearing and pain.  Usually, recovery from muscle strain takes 1-2 weeks. Complete healing takes 5-6 weeks.  CAUSES  Muscle strain happens when a sudden, violent force placed on a muscle stretches it too far. This may occur with lifting, sports, or a fall.  RISK FACTORS Muscle strain is especially common in athletes.  SIGNS AND SYMPTOMS At the site of the muscle strain, there may be:  Pain.  Bruising.  Swelling.  Difficulty using the muscle due to pain or lack of normal function. DIAGNOSIS  Your health care provider will perform a physical exam and ask about your medical history. TREATMENT  Often, the best treatment for a muscle strain is resting, icing, and applying cold compresses to the injured area.  HOME CARE INSTRUCTIONS   Use the PRICE method of treatment to promote muscle healing during the first 2-3 days after your injury. The PRICE method involves:  Protecting the muscle from being injured again.  Restricting your activity and resting the injured body part.  Icing your injury. To do this, put ice in a plastic bag. Place a towel between your skin and the bag. Then, apply the ice and leave it on from 15-20 minutes each hour. After the third day, switch to moist heat packs.  Apply compression to the injured area with a splint or elastic bandage. Be careful not to wrap it too tightly. This may interfere with blood circulation or increase swelling.  Elevate the injured body part above the level of your heart  as often as you can.  Only take over-the-counter or prescription medicines for pain, discomfort, or fever as directed by your health care provider.  Warming up prior to exercise helps to prevent future muscle strains. SEEK MEDICAL CARE IF:   You have increasing pain or swelling in the injured area.  You have numbness, tingling, or a significant loss of strength in the injured area. MAKE SURE YOU:   Understand these instructions.  Will watch your condition.  Will get help right away if you are not doing well or get worse. Document Released: 12/26/2004 Document Revised: 10/16/2012 Document Reviewed: 07/25/2012 [facility] Patient Information 2015 [place], [place]. This information is not intended to replace advice given to you by your health care provider. Make sure you discuss any questions you have with your health care provider.

## 2013-07-13 NOTE — ED Provider Notes (Signed)
Medical screening examination/treatment/procedure(s) were conducted as a shared visit with non-physician practitioner(s) and myself.  I personally evaluated the patient during the encounter.   EKG Interpretation None       Leota Jacobsen, MD 07/13/13 [phone]

## 2013-07-13 NOTE — ED Provider Notes (Addendum)
Medical screening examination/treatment/procedure(s) were conducted as a shared visit with non-physician practitioner(s) and myself.  I personally evaluated the patient during the encounter.   EKG Interpretation None     Patient here complaining of right-sided flank pain that characterized as sharp and worse with twisting of her torso. Does have a clotting disorder as well as recent travel history but she denies any dyspnea or leg pain. We'll check a d-dimer as my suspicion is low and to a CT of her chest and appropriate. Suspect that she has musculoskeletal pain  4:26 AM Patient's chest CT with results noted and no evidence of pulmonary embolus. Patient instructed on the importance of followup with an additional CT to rule out lung cancer  Leota Jacobsen, MD 07/13/13 0127  Leota Jacobsen, MD 07/13/13 [phone]

## 2013-07-15 ENCOUNTER — Telehealth: Payer: Self-pay | Admitting: *Deleted

## 2013-07-15 NOTE — Telephone Encounter (Signed)
[facility] Triage Call Report Triage Record Num: [id number] Operator: Kerney Elbe Patient Name: [name] Call Date & Time: 07/12/2013 9:26:04PM Patient Phone: [phone] PCP: Unice Cobble Patient Gender: Female PCP Fax : [phone] Patient DOB: 09-06-1955 [facility] Name[facility] Reason for Call: Caller: [place]/Patient; PCP: Unice Cobble; CB#: [phone]; Call regarding Back Pain in ribs; Onset 07/12/13 at about 1230 began having pain in ribs on right in the back; movement and deep breath worsens, feels like a stabbing pain. Pain has worsened during the day Has just taken Advil. Afebrile. Pain rated 10/10 when it "flares", if no movement 0/10. All emergent sxs r/o per Chest Pain protocol w/ the exception of "Moderate to severe pain occurring with deep breath or productive cough for one full day or more". RN advised ED evaluation since office is closed. Caller will go to [facility] ED. Protocol(s) Used: Chest Pain Recommended Outcome per Protocol: See Provider within 8 Hours Override Outcome if Used in Protocol: See ED Immediately RN Reason for Override Outcome: Office Is Closed. Reason for Outcome: Moderate to severe pain occurring with deep breath or a productive cough for one full day or more Care Advice: ~ Limit activities and increase periods of rest. Call EMS 911 if sudden worsening of breathing problems, dusky or blue color to skin, continuous chest pain, weakness, or confusion occurs. ~ ~ List, or take, all current prescription(s), nonprescription or alternative medication(s) to provider for evaluation. Analgesic/Antipyretic Advice - NSAIDs: Consider aspirin, ibuprofen, naproxen or ketoprofen for pain or fever as directed on label or by pharmacist/provider. PRECAUTIONS: - If over 36 years of age, should not take longer than 1 week without consulting provider. EXCEPTIONS: - Should not be used if taking blood thinners or have bleeding problems. - Do not use  if have history of sensitivity/allergy to any of these medications; or history of cardiovascular, ulcer, kidney, liver disease or diabetes unless approved by provider. - Do not exceed recommended dose or frequency. ~ Total water intake includes drinking water, water in beverages, and water contained in food. Fluids make up about 80% of the body's total hydration need. Individual fluid requirement to maintain hydration vary based on physical activity, environmental factors and illness. Limit fluids that contain sugar, caffeine, or alcohol. Urine will be very light yellow color when you drink enough fluids. ~ 07/12/2013 9:46:38PM Page 1 of 1 [id number]

## 2013-07-16 ENCOUNTER — Encounter: Payer: Self-pay | Admitting: Internal Medicine

## 2013-07-16 ENCOUNTER — Ambulatory Visit (INDEPENDENT_AMBULATORY_CARE_PROVIDER_SITE_OTHER): Payer: BC Managed Care – PPO | Admitting: Internal Medicine

## 2013-07-16 VITALS — BP 124/88 | HR 84 | Temp 98.5°F | Wt 282.2 lb

## 2013-07-16 DIAGNOSIS — R911 Solitary pulmonary nodule: Secondary | ICD-10-CM

## 2013-07-16 NOTE — Patient Instructions (Signed)
Your next office appointment will be determined based upon review of your pending PET scan. Those instructions will be transmitted to you through My Chart.

## 2013-07-16 NOTE — Progress Notes (Signed)
Pre visit review using our clinic review tool, if applicable. No additional management support is needed unless otherwise documented below in the visit note. 

## 2013-07-16 NOTE — Progress Notes (Signed)
   Subjective:    Patient ID: [name], female    DOB: 1955/06/29, 58 y.o.   MRN: [id number]  HPI   The 07/12/13 emergency room records were reviewed. She was seen for right posterior inferior thoracic area pain. Because of her past history of clotting disorder; d-dimer was performed. This was elevated at 1.35. CT angiogram did not reveal pulmonary embolus. It revealed a 2 x 1.6 x 1.1 cm solid nodule in the posterior medial right lower lobe. There is also mild atelectasis in the surrounding lung tissue. There was a minor 6 mm ground glass nodule in the right upper lobe. Compared to 03/29/12 the larger lesion had been sub-solid/groundglass in nature. PET scan was recommended.    Review of Systems   Chest pain, palpitations, tachycardia, exertional dyspnea, paroxysmal nocturnal dyspnea, claudication or edema are absent.  Unexplained weight loss, abdominal pain, significant dyspepsia, dysphagia, melena, rectal bleeding, or persistently small caliber stools are denied.  She denies dysuria, pyuria, hematuria, frequency, nocturia or polyuria.     Objective:   Physical Exam General appearance :adequate nourishment w/o distress.Severity obesity present as per CDC Guidelines  Eyes: No conjunctival inflammation or scleral icterus is present.  Oral exam: Dental hygiene is good; lips and gums are healthy appearing.There is no oropharyngeal erythema or exudate noted.   Heart:  Normal rate and regular rhythm. S1 and S2 normal without gallop, murmur, click, rub or other extra sounds     Lungs:Chest clear to auscultation; no wheezes, rhonchi,rales ,or rubs present.No increased work of breathing.    She has tenderness to palpation of the lower thoracic margin bilaterally.  Abdomen: Protuberant; bowel sounds normal, soft and non-tender without masses, organomegaly or hernias noted.  No guarding or rebound . No tenderness over the flanks to percussion  Musculoskeletal: Able to lie flat and sit up  without help. Negative straight leg raising bilaterally. Gait normal.  Homans sign negative  Skin:Warm & dry.  Intact without suspicious lesions or rashes ; no jaundice or tenting  Lymphatic: No lymphadenopathy is noted about the head, neck, axilla           Assessment & Plan:  #1 enlarging right lower lobe nodule in the context of past history of multiple decade exposure to secondhand smoke  Plan: PET scan

## 2013-07-21 ENCOUNTER — Other Ambulatory Visit: Payer: Self-pay | Admitting: Cardiology

## 2013-07-28 ENCOUNTER — Other Ambulatory Visit: Payer: BC Managed Care – PPO

## 2013-08-04 ENCOUNTER — Other Ambulatory Visit: Payer: Self-pay | Admitting: Internal Medicine

## 2013-08-04 ENCOUNTER — Ambulatory Visit (HOSPITAL_COMMUNITY)
Admission: RE | Admit: 2013-08-04 | Discharge: 2013-08-04 | Disposition: A | Discharge status: Home / Self Care | Payer: BC Managed Care – PPO | Source: Ambulatory Visit | Attending: Internal Medicine | Admitting: Internal Medicine

## 2013-08-04 ENCOUNTER — Encounter (HOSPITAL_COMMUNITY): Payer: Self-pay

## 2013-08-04 DIAGNOSIS — R9389 Abnormal findings on diagnostic imaging of other specified body structures: Secondary | ICD-10-CM | POA: Insufficient documentation

## 2013-08-04 DIAGNOSIS — R911 Solitary pulmonary nodule: Secondary | ICD-10-CM | POA: Insufficient documentation

## 2013-08-04 LAB — GLUCOSE, CAPILLARY: GLUCOSE-CAPILLARY: 96 mg/dL (ref 70–99)

## 2013-08-04 MED ORDER — FLUDEOXYGLUCOSE F - 18 (FDG) INJECTION: 13.7000 | Freq: Once | INTRAVENOUS | Status: AC | PRN

## 2013-08-05 ENCOUNTER — Telehealth: Payer: Self-pay | Admitting: Internal Medicine

## 2013-08-05 NOTE — Telephone Encounter (Signed)
Pt request phone call from Dr. Linna Darner concern about the email she got last night from Dr. Linna Darner. Pt only want to speak to Dr. Linna Darner. Please call pt

## 2013-08-06 ENCOUNTER — Other Ambulatory Visit: Payer: BC Managed Care – PPO

## 2013-08-06 NOTE — Telephone Encounter (Signed)
Pt call back request to speak to Dr. Linna Darner, pt would like to know which doctor in pulmonary Dr. Linna Darner want her to see. Please call pt

## 2013-08-07 ENCOUNTER — Telehealth: Payer: Self-pay | Admitting: Emergency Medicine

## 2013-08-07 DIAGNOSIS — R918 Other nonspecific abnormal finding of lung field: Secondary | ICD-10-CM

## 2013-08-07 NOTE — Telephone Encounter (Signed)
lmomtcb x1 for pt RB has nothing sooner

## 2013-08-08 ENCOUNTER — Other Ambulatory Visit: Payer: BC Managed Care – PPO

## 2013-08-08 NOTE — Telephone Encounter (Signed)
Spoke with patient - has Consult appt with Dr Lamonte Sakai 09/04/13 per Dr Linna Darner to review recent scans.  Pt is very concerned with her results as the most recent PET Scan showed possible malignancy suspicious for bronchogenic carcinoma.  IMPRESSION:  1. Pulmonary nodule within the right lower lobe exhibits malignant  range FDG uptake and is suspicious for primary bronchogenic  carcinoma. Correlation with tissue sampling is recommended.   Advised the pt that there are no sooner appt available with Dr Lamonte Sakai --offered appt with another Dr in our office and she refused stating that Dr Linna Darner was very specific when he advised the patient to see Dr Lamonte Sakai for Consultation of PET Scan and discuss having biopsy. I advised the patient that there are no sooner openings with Dr Lamonte Sakai and that we would have to speak with him to see if there is somewhere he can get her in sooner as I cannot make that call. The patient is very "frantic" and anxious, wanting to be seen ASAP--states that he mother died from lung cancer because the doctors did not catch it in time and she feels that she has a ticking time bomb in her chest and she is just left sitting and waiting. AGAIN, I offered an appt with another physician and the patient refused.   Please advise Dr Lamonte Sakai. Thanks.  The patient is aware that Dr Lamonte Sakai not in office so a response might not be available until next week 8/3 or 8/4

## 2013-08-11 NOTE — Telephone Encounter (Signed)
Thank you :)

## 2013-08-11 NOTE — Telephone Encounter (Signed)
We can see her this week; overbook her or check with TD to see if we could start early at 1:00 or 1:15p. She will likely need a repeat Ct scan of the chest, no contrast, superD protocol, in order to do the procedure. Please go ahead and order this with disk to be sent to me. Thanks.

## 2013-08-11 NOTE — Telephone Encounter (Signed)
Pt scheduled at 1pm on 08/14/13--aware to arrive at 12:45 to fill out forms Pt aware that CT ordered as well.  Nothing further needed.  Will send to Dr Lamonte Sakai as Juluis Rainier that this has been ordered and patient is scheduled.

## 2013-08-11 NOTE — Telephone Encounter (Signed)
Spoke with pt--aware that Dr Lamonte Sakai can see her sooner.  Pt is in [place] today, supposed to fly home tomorrow 08/12/13 Pt states that she can do Thursday or Friday this week.  Aware that we will call her back with an appt date/time  Order for CT SuperD Chest w/o Contrast placed. PT NEEDS TO BE MADE AWARE THIS ORDERED AND SOMEONE WILL BE CONTACTING HER TO SCHEDULE.

## 2013-08-13 ENCOUNTER — Ambulatory Visit (INDEPENDENT_AMBULATORY_CARE_PROVIDER_SITE_OTHER)
Admission: RE | Admit: 2013-08-13 | Discharge: 2013-08-13 | Disposition: A | Discharge status: Home / Self Care | Payer: BC Managed Care – PPO | Source: Ambulatory Visit | Attending: Emergency Medicine | Admitting: Emergency Medicine

## 2013-08-13 ENCOUNTER — Institutional Professional Consult (permissible substitution): Payer: BC Managed Care – PPO | Admitting: Internal Medicine

## 2013-08-13 DIAGNOSIS — R918 Other nonspecific abnormal finding of lung field: Secondary | ICD-10-CM

## 2013-08-13 DIAGNOSIS — R9389 Abnormal findings on diagnostic imaging of other specified body structures: Secondary | ICD-10-CM

## 2013-08-14 ENCOUNTER — Encounter: Payer: Self-pay | Admitting: Emergency Medicine

## 2013-08-14 ENCOUNTER — Ambulatory Visit (INDEPENDENT_AMBULATORY_CARE_PROVIDER_SITE_OTHER): Payer: BC Managed Care – PPO | Admitting: Emergency Medicine

## 2013-08-14 VITALS — BP 122/72 | HR 76 | Temp 97.8°F | Ht 66.0 in | Wt 275.8 lb

## 2013-08-14 DIAGNOSIS — R0989 Other specified symptoms and signs involving the circulatory and respiratory systems: Secondary | ICD-10-CM

## 2013-08-14 DIAGNOSIS — R0609 Other forms of dyspnea: Secondary | ICD-10-CM

## 2013-08-14 DIAGNOSIS — R911 Solitary pulmonary nodule: Secondary | ICD-10-CM

## 2013-08-14 DIAGNOSIS — R0600 Dyspnea, unspecified: Secondary | ICD-10-CM

## 2013-08-14 NOTE — Assessment & Plan Note (Signed)
Question of asthma / RAD in her hx. Will repeat her PFT to try to better characterize. Certainly may be a component of deconditioning.

## 2013-08-14 NOTE — Progress Notes (Signed)
Subjective:    Patient ID: [name], female    DOB: [birth year], 58 y.o.   MRN: [id number]  HPI 58 yo woman, never smoker but significant passive exposure, hx of endometrial CA, anti-cardiolipin Ab syndrome, ? RAD. She has a family hx of lung CA (her mother). She has been followed for pulm nodules, first noted on an abdominal CT scan and then CT scan chest performed 03/2011.  There has been some change in appearance in the RLL nodule on serial CT; a RUL GG lesion is unchanged. She is referred to assess the nodule.   ROS > dyspnea, some R lower back discomfort    Review of Systems  Constitutional: Negative for fever and unexpected weight change.  HENT: Negative for congestion, dental problem, ear pain, nosebleeds, postnasal drip, rhinorrhea, sinus pressure, sneezing, sore throat and trouble swallowing.   Eyes: Negative for redness and itching.  Respiratory: Positive for cough, shortness of breath and wheezing. Negative for chest tightness.   Cardiovascular: Positive for palpitations. Negative for leg swelling.  Gastrointestinal: Negative for nausea and vomiting.  Genitourinary: Negative for dysuria.  Musculoskeletal: Negative for joint swelling.  Skin: Negative for rash.  Neurological: Negative for headaches.  Hematological: Does not bruise/bleed easily.  Psychiatric/Behavioral: Negative for dysphoric mood. The patient is not nervous/anxious.    Past Medical History  Diagnosis Date  . Encephalitis age 22    with coma  . CMV pneumonia     CMV pneumonia 1994  . Colitis 08/2004    ?Ischemic colitis in 2006.  . Endometrial cancer     a. S/P Hysterectomy and BSO  . Obesity   . Clotting disorder     a. Anti-cardiolipin antibody syndrome (IgM anticardiolipin, B2-glycoprotein IgM). b. Has had superficial thr. phebitis 06/2011,  . Allergic rhinitis   . Colon polyps   . Diverticulosis     Has had diverticulitis x 2  . Hemorrhoids   . Palpitations     a. Holter (10/11): 9.6% of  beats were atrial ectopy, average HR 78, short runs of up to 5-6 beats of probable atrial tachycardia.   . Other and unspecified hyperlipidemia 06/22/2007  . BENIGN POSITIONAL VERTIGO 03/03/2010  . HIATAL HERNIA 05/26/2009  . 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 Superficial thrombophlebitis   . Edema     a. LLE post ACL sprain 2010.  Marland Kitchen Reactive airway disease     a. ?RAD wheezes with respiratory infections.  . Normal cardiac stress test     a. Stress echo (10/11): EF 60-65%, no significant valvular abnormalities. No wall motion abnormalities with exertion, suggesting no ischemia.   . Pulmonary nodules     a. Followed periodically by CT (Last in 03/2012).  . Thyroid nodule     a. Small bilat thyroid nodule by [place] 2013, periodically followed.     Family History  Problem Relation Age of Onset  . Heart attack Mother 77  . Stroke      Grandmother (? M or P)  . Heart failure      Grandmother (?M or P)  . Sudden death Paternal Grandfather   . Atrial fibrillation Mother   . Atrial fibrillation      Grandmother (? M or P)  . Asthma Father   . Diabetes Father   . Liver disease Father     Liver transplant  . Diverticulitis Father   . Deep vein thrombosis Mother   . Lung cancer Mother   .  Asthma Sister   . Prostate cancer Maternal Grandfather   . Heart attack Maternal Grandmother     Questionable  . Heart failure Maternal Grandmother     Questionable  . Deep vein thrombosis Maternal Grandmother   . Colon polyps Father   . Colon cancer Paternal Grandmother     Questionable  . Heart failure Paternal Grandfather      History   Social History  . Marital Status: Married    Spouse Name: N/A    Number of Children: N/A  . Years of Education: N/A   Occupational History  . 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 Smoking status: Passive Smoke Exposure - Never Smoker  . Smokeless tobacco: Never Used     Comment: passive exposure  2nd hand x 18 years  . Alcohol Use: No  . Drug Use: No  . Sexual Activity: Not on file   Other Topics Concern  . Not on file   Social History Narrative  . No narrative on file     Allergies  Allergen Reactions  . Rofecoxib     Ankle edema  . Sulfa Antibiotics Other (See Comments)    Patient is not sure but thinks she may have an allergy to sulfa  . Advil [Ibuprofen]     Ankle edema     Outpatient Prescriptions Prior to Visit  Medication Sig Dispense Refill  . amoxicillin (AMOXIL) 500 MG capsule Take 500 mg by mouth 3 (three) times daily. As needed for Dental work      . aspirin 81 MG tablet Take 81 mg by mouth daily.        Marland Kitchen atorvastatin (LIPITOR) 20 MG tablet TAKE 1 TABLET (20 MG TOTAL) BY MOUTH DAILY.  30 tablet  5  . Flaxseed, Linseed, (FLAX SEED OIL) 1000 MG CAPS Take 1,000 mg by mouth daily.        . Magnesium 250 MG TABS Take 1 tablet by mouth daily as needed.       . Multiple Vitamin (MULTIVITAMIN) tablet Take 1 tablet by mouth daily.         No facility-administered medications prior to visit.         Objective:   Physical Exam Filed Vitals:   08/14/13 1305  BP: 122/72  Pulse: 76  Temp: 97.8 F (36.6 C)  TempSrc: Oral  Height: 5\' 6"  (1.676 m)  Weight: 275 lb 12.8 oz (125.102 kg)  SpO2: 94%   Gen: Pleasant, obese woman, in no distress,  normal affect  ENT: No lesions,  mouth clear but dry,  oropharynx clear, no postnasal drip  Neck: No JVD, no TMG, no carotid bruits  Lungs: No use of accessory muscles, distant BS but clear.   Cardiovascular: RRR, heart sounds normal, no murmur or gallops, no peripheral edema  Musculoskeletal: No deformities, no cyanosis or clubbing  Neuro: alert, non focal  Skin: Warm, no lesions or rashes    CT chest 07/13/13 --  COMPARISON: Prior radiograph from 06/26/2012 as well as CT  03/29/2012.  FINDINGS:  Visualized thyroid gland is within normal limits.  No pathologically enlarged mediastinal, hilar, or axillary  lymph  nodes are identified.  Intrathoracic aorta is of normal caliber and appearance. Great  vessels are within normal limits.  Heart size is normal. No pericardial effusion.  Pulmonary arterial tree is well opacified. No filling defect to  suggest acute pulmonary embolism identified. Re-formatted imaging  confirms these findings.  A in ovoid nodule measuring approximately 2.0 x 1.6 x 1.1 cm is seen  within the posterior-medial right lower lobe (series 7, image 76),  indeterminate. There is associated mild atelectasis with within the  adjacent lung. This was sub solid in appearance on prior study.  Subtle 6 mm ground-glass nodule within the right upper lobe  measuring 6 mm is stable from prior (series 7, image 20). No other  definite pulmonary nodule or mass.  No airspace consolidation or pulmonary edema. No pleural effusion.  The visualized portions of the upper abdomen are within normal  limits.  No acute osseous abnormality. No worrisome lytic or blastic osseous  lesions.  IMPRESSION:  1. No CT evidence of acute pulmonary embolism.  2. 2.0 x 1.6 x 1.1 cm solid nodule within the posteromedial right  lower lobe. This lesion was sub solid/ground-glass in nature on  prior CT from 03/29/2012. Finding is indeterminate, but suspicious  for possible primary adenocarcinoma. Further evaluation with PET-CT  is recommended.  3. Stable ground-glass nodule within the right upper lobe as above      Assessment & Plan:  Abnormal chest CT Slowly evolving RLL nodule with a stable RUL GG opacity. PET shows uptake in the larger lesion. I discussed with her the two best options > surgical removal vs bx by ENB. She would like to discuss with TCTS and pursue VATS if they agree. I will refer her to see TCTS, order PFT for their review.    Exertional dyspnea Question of asthma / RAD in her hx. Will repeat her PFT to try to better characterize. Certainly may be a component of deconditioning.

## 2013-08-14 NOTE — Patient Instructions (Signed)
We will refer you to see Thoracic Surgery to discuss possible removal of your right lower lobe nodule. If this is not deemed the best strategy, then Dr Lamonte Sakai will schedule a nodule biopsy by bronchoscopy.  We will perform pulmonary function testing Follow with Dr Lamonte Sakai next available

## 2013-08-14 NOTE — Assessment & Plan Note (Signed)
Slowly evolving RLL nodule with a stable RUL GG opacity. PET shows uptake in the larger lesion. I discussed with her the two best options > surgical removal vs bx by ENB. She would like to discuss with TCTS and pursue VATS if they agree. I will refer her to see TCTS, order PFT for their review.

## 2013-08-19 ENCOUNTER — Other Ambulatory Visit: Payer: Self-pay | Admitting: Cardiology

## 2013-08-19 ENCOUNTER — Other Ambulatory Visit (INDEPENDENT_AMBULATORY_CARE_PROVIDER_SITE_OTHER): Payer: BC Managed Care – PPO

## 2013-08-19 DIAGNOSIS — E785 Hyperlipidemia, unspecified: Secondary | ICD-10-CM

## 2013-08-19 DIAGNOSIS — R002 Palpitations: Secondary | ICD-10-CM

## 2013-08-19 LAB — LIPID PANEL
Cholesterol: 187 mg/dL (ref 0–200)
HDL: 50.6 mg/dL (ref >39.00)
LDL Cholesterol: 113 mg/dL — ABNORMAL HIGH (ref 0–99)
NONHDL: 136.4
Total CHOL/HDL Ratio: 4
Triglycerides: 117 mg/dL (ref 0.0–149.0)
VLDL: 23.4 mg/dL (ref 0.0–40.0)

## 2013-08-19 LAB — HEPATIC FUNCTION PANEL
ALT: 27 U/L (ref 0–35)
AST: 26 U/L (ref 0–37)
Albumin: 3.8 g/dL (ref 3.5–5.2)
Alkaline Phosphatase: 99 U/L (ref 39–117)
BILIRUBIN DIRECT: 0.1 mg/dL (ref 0.0–0.3)
BILIRUBIN TOTAL: 0.6 mg/dL (ref 0.2–1.2)
Total Protein: 6.9 g/dL (ref 6.0–8.3)

## 2013-08-21 ENCOUNTER — Ambulatory Visit (INDEPENDENT_AMBULATORY_CARE_PROVIDER_SITE_OTHER): Payer: BC Managed Care – PPO | Admitting: Emergency Medicine

## 2013-08-21 DIAGNOSIS — R911 Solitary pulmonary nodule: Secondary | ICD-10-CM

## 2013-08-21 LAB — PULMONARY FUNCTION TEST
DL/VA % PRED: 100 %
DL/VA: 5.06 ml/min/mmHg/L
DLCO UNC % PRED: 84 %
DLCO unc: 22.64 ml/min/mmHg
FEF 25-75 PRE: 2.7 L/s
FEF 25-75 Post: 2.67 L/sec
FEF2575-%Change-Post: 0 %
FEF2575-%Pred-Post: 108 %
FEF2575-%Pred-Pre: 109 %
FEV1-%Change-Post: -1 %
FEV1-%Pred-Post: 87 %
FEV1-%Pred-Pre: 88 %
FEV1-POST: 2.4 L
FEV1-Pre: 2.45 L
FEV1FVC-%Change-Post: 1 %
FEV1FVC-%Pred-Pre: 108 %
FEV6-%CHANGE-POST: -2 %
FEV6-%PRED-PRE: 84 %
FEV6-%Pred-Post: 81 %
FEV6-Post: 2.81 L
FEV6-Pre: 2.89 L
FEV6FVC-%PRED-POST: 103 %
FEV6FVC-%Pred-Pre: 103 %
FVC-%CHANGE-POST: -2 %
FVC-%PRED-POST: 79 %
FVC-%Pred-Pre: 81 %
FVC-PRE: 2.89 L
FVC-Post: 2.81 L
Post FEV1/FVC ratio: 86 %
Post FEV6/FVC ratio: 100 %
Pre FEV1/FVC ratio: 85 %
Pre FEV6/FVC Ratio: 100 %
RV % pred: 68 %
RV: 1.42 L
TLC % pred: 80 %
TLC: 4.32 L

## 2013-08-21 NOTE — Progress Notes (Signed)
PFT done today. 

## 2013-08-22 ENCOUNTER — Telehealth: Payer: Self-pay | Admitting: *Deleted

## 2013-08-22 DIAGNOSIS — E785 Hyperlipidemia, unspecified: Secondary | ICD-10-CM

## 2013-08-22 MED ORDER — ATORVASTATIN CALCIUM 20 MG PO TABS: ORAL_TABLET | ORAL | Status: DC

## 2013-08-22 NOTE — Telephone Encounter (Signed)
pt notified about lab results. she states that she ran out of lipitor for about 2 weeks before labs were done, she feels may not be accurate. I agreed. Advised to re-start, refill sent in, scheduled pt for FLP/LFT 10/30 to recheck.  Pt informed that she had a PET scan just the other day and that it indicative of malignancy for lung Ca and said she wanted Dr. Aundra Dubin to know and how should she go about this. I advised pt tcb Monday 8/17 ask for Gregery Na RN his nurse and s/w her in reference about the PET.  Pt said ok and thank you for all of my help today.

## 2013-08-26 ENCOUNTER — Institutional Professional Consult (permissible substitution) (INDEPENDENT_AMBULATORY_CARE_PROVIDER_SITE_OTHER): Payer: BC Managed Care – PPO | Admitting: Thoracic Surgery (Cardiothoracic Vascular Surgery)

## 2013-08-26 ENCOUNTER — Encounter: Payer: Self-pay | Admitting: Thoracic Surgery (Cardiothoracic Vascular Surgery)

## 2013-08-26 VITALS — BP 130/90 | HR 88 | Ht 66.0 in | Wt 275.0 lb

## 2013-08-26 DIAGNOSIS — R911 Solitary pulmonary nodule: Secondary | ICD-10-CM

## 2013-08-26 NOTE — Progress Notes (Signed)
PCP is Unice Cobble, MD Referring Provider is Collene Gobble, MD  Chief Complaint  Patient presents with  . NEW THORACIC    NODULE RIGHT LUNG CT    HPI:58 yo woman sent for consultation regarding a right lower lobe lung mass.  Mrs. Graf is a 58 yo woman with a medical history significant for morbid obesity and antiphospholipid antibodies. She is a life long nonsmoker but had heavy exposure to second hand smoke as a child with both parents being smokers. Her mother died of lung cancer.  She has been followed since 2013 for a ground glass opacity in the right lower lobe. She went to the ED in July with right sided pleuritic chest pain. A CT angio was done. It showed no PE, but there was progression of the 2.0 x 1.6 x 1.1 cm right lower lobe nodule. This had been a pure GGO initially but has progressed to a solid nodule with a significant soft tissue component. She had a PET/CT which showed the lesion was hypermetabolic.   She has not had any chest pain recently. She does have wheezing mostly at night. She has a dry cough. She denies hemoptysis. She does get SOB with exertion but feels she could climb a flight of stairs without difficulty. She has not had any significant weight loss.  Zubrod Score: At the time of surgery this patient's most appropriate activity status/level should be described as: []     0    Normal activity, no symptoms [x]     1    Restricted in physical strenuous activity but ambulatory, able to do out light work []     2    Ambulatory and capable of self care, unable to do work activities, up and about >50 % of waking hours                              []     3    Only limited self care, in bed greater than 50% of waking hours []     4    Completely disabled, no self care, confined to bed or chair []     5    Moribund    Past Medical History  Diagnosis Date  . Encephalitis age 37    with coma  . CMV pneumonia     CMV pneumonia 1994  . Colitis 08/2004    ?Ischemic  colitis in 2006.  . Endometrial cancer     a. S/P Hysterectomy and BSO  . Obesity   . Clotting disorder     a. Anti-cardiolipin antibody syndrome (IgM anticardiolipin, B2-glycoprotein IgM). b. Has had superficial thr. phebitis 06/2011,  . Allergic rhinitis   . Colon polyps   . Diverticulosis     Has had diverticulitis x 2  . Hemorrhoids   . Palpitations     a. Holter (10/11): 9.6% of beats were atrial ectopy, average HR 78, short runs of up to 5-6 beats of probable atrial tachycardia.   . Other and unspecified hyperlipidemia 06/22/2007  . BENIGN POSITIONAL VERTIGO 03/03/2010  . HIATAL HERNIA 05/26/2009  . 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 Superficial thrombophlebitis   . Edema     a. LLE post ACL sprain 2010.  Marland Kitchen Reactive airway disease     a. ?RAD wheezes with respiratory infections.  . Normal cardiac stress test     a. Stress echo (10/11): EF  60-65%, no significant valvular abnormalities. No wall motion abnormalities with exertion, suggesting no ischemia.   . Pulmonary nodules     a. Followed periodically by CT (Last in 03/2012).  . Thyroid nodule     a. Small bilat thyroid nodule by [place] 2013, periodically followed.    Past Surgical History  Procedure Laterality Date  . Colonoscopy w/ polypectomy  01/26/2004    Dr.Perry  . Total abdominal hysterectomy w/ bilateral salpingoophorectomy  01/2004    For endometrial cancer ; Dr Aldean Ast  . Tonsillectomy    . Laparoscopy      for 3 ovarian cyst   . Cholecystectomy    . Fibroidectomy      Family History  Problem Relation Age of Onset  . Heart attack Mother 61  . Stroke      Grandmother (? M or P)  . Heart failure      Grandmother (?M or P)  . Sudden death Paternal Grandfather   . Atrial fibrillation Mother   . Atrial fibrillation      Grandmother (? M or P)  . Asthma Father   . Diabetes Father   . Liver disease Father     Liver transplant  . Diverticulitis Father   . Deep vein  thrombosis Mother   . Lung cancer Mother   . Asthma Sister   . Prostate cancer Maternal Grandfather   . Heart attack Maternal Grandmother     Questionable  . Heart failure Maternal Grandmother     Questionable  . Deep vein thrombosis Maternal Grandmother   . Colon polyps Father   . Colon cancer Paternal Grandmother     Questionable  . Heart failure Paternal Grandfather     Social History History  Substance Use Topics  . Smoking status: Passive Smoke Exposure - Never Smoker  . Smokeless tobacco: Never Used     Comment: passive exposure 2nd hand x 18 years  . Alcohol Use: No    Current Outpatient Prescriptions  Medication Sig Dispense Refill  . aspirin 81 MG tablet Take 81 mg by mouth daily.        Marland Kitchen atorvastatin (LIPITOR) 20 MG tablet TAKE 1 TABLET (20 MG TOTAL) BY MOUTH DAILY.  30 tablet  11  . Cholecalciferol (VITAMIN D3) 2000 UNITS TABS Take 2,000 Units by mouth daily.      . Flaxseed, Linseed, (FLAX SEED OIL) 1000 MG CAPS Take 1,000 mg by mouth daily.        . Magnesium 250 MG TABS Take 1 tablet by mouth daily as needed.       . Multiple Vitamin (MULTIVITAMIN) tablet Take 1 tablet by mouth daily.        . metaxalone (SKELAXIN) 800 MG tablet       . metoprolol tartrate (LOPRESSOR) 25 MG tablet       . oxyCODONE-acetaminophen (PERCOCET/ROXICET) 5-325 MG per tablet        No current facility-administered medications for this visit.    Allergies  Allergen Reactions  . Vioxx [Rofecoxib] Swelling    Ankle edema  . Sulfa Antibiotics Other (See Comments)    Patient is not sure but thinks she may have an allergy to sulfa    Review of Systems  Constitutional: Positive for fatigue. Negative for fever and chills.  Respiratory: Positive for cough, shortness of breath and wheezing.   Cardiovascular: Positive for palpitations (has PACs).       Varicose veins  Gastrointestinal:  Hiatal hernia  Hematological:       Clotting disorder- antiphospholipid antibodies  All  other systems reviewed and are negative.   BP 130/90  Pulse 88  Ht 5\' 6"  (1.676 m)  Wt 275 lb (124.739 kg)  BMI 44.41 kg/m2  SpO2 98% Physical Exam  Vitals reviewed. Constitutional: She is oriented to person, place, and time. No distress.  Morbidly obese  HENT:  Head: Normocephalic and atraumatic.  Eyes: EOM are normal. Pupils are equal, round, and reactive to light.  Neck: Neck supple. No thyromegaly present.  Cardiovascular: Normal rate, regular rhythm and intact distal pulses.   No murmur heard. Pulmonary/Chest: She has no wheezes.  Diminished BS bilaterally  Abdominal: There is no tenderness.  obese  Musculoskeletal: Normal range of motion. She exhibits no edema.  Lymphadenopathy:    She has no cervical adenopathy.  Neurological: She is alert and oriented to person, place, and time. No cranial nerve deficit.  Skin: Skin is warm and dry.     Diagnostic Tests: CT CHEST WITHOUT CONTRAST  TECHNIQUE:  Multidetector CT imaging of the chest was performed using thin slice  collimation for electromagnetic bronchoscopy planning purposes,  without intravenous contrast.  COMPARISON: PET-CT 08/04/2013.  FINDINGS:  Mediastinum: Heart size is normal. There is no significant  pericardial fluid, thickening or pericardial calcification. There is  atherosclerosis of the thoracic aorta, the great vessels of the  mediastinum and the coronary arteries, including calcified  atherosclerotic plaque in the left circumflex and right coronary  arteries. No pathologically enlarged mediastinal or hilar lymph  nodes. Please note that accurate exclusion of hilar adenopathy is  limited on noncontrast CT scans. Esophagus is unremarkable in  appearance.  Lungs/Pleura: In the medial aspect of the right lower lobe (image  299 of series 4) there is again a pleural-based nodule measuring 2.1  x 1.5 cm, with small amount of surrounding ground-glass attenuation.  This is immediately adjacent to a small  subsegmental size bronchus  in the medial aspect of the posterobasal segment of the right lower  lobe. Nonspecific 6 mm ground-glass attenuation nodule in the apex  of the right upper lobe (image 62 of series 4) is unchanged. No  other suspicious appearing pulmonary nodules or masses. No acute  consolidative airspace disease. No pleural effusions.  Upper Abdomen: Status post cholecystectomy. Otherwise, unremarkable.  Musculoskeletal: There are no aggressive appearing lytic or blastic  lesions noted in the visualized portions of the skeleton.  IMPRESSION:  1. No change in 2.1 x 1.5 cm right lower lobe nodule, which is  immediately adjacent to a small subsegmental sized bronchus in the  medial aspect of the posterobasal segment of the right lower lobe.  2. Nonspecific 6 mm ground-glass attenuation nodule in the apex of  the right upper lobe is also unchanged, and similar in retrospect to  remote prior studies dating back to 08/08/2011, likely benign.  3. Atherosclerosis, including 2 vessel coronary artery disease.  Please note that although the presence of coronary artery calcium  documents the presence of coronary artery disease, the severity of  this disease and any potential stenosis cannot be assessed on this  non-gated CT examination. Assessment for potential risk factor  modification, dietary therapy or pharmacologic therapy may be  warranted, if clinically indicated.  Electronically Signed  By: Vinnie Langton M.D.  On: 08/13/2013 15:24   NUCLEAR MEDICINE PET SKULL BASE TO THIGH  TECHNIQUE:  13.7 mCi F-18 FDG was injected intravenously. Full-ring PET imaging  was performed  from the skull base to thigh after the radiotracer. CT  data was obtained and used for attenuation correction and anatomic  localization.  FASTING BLOOD GLUCOSE: Value: 96 mg/dl  COMPARISON: 07/13/2013  FINDINGS:  NECK  There is no hypermetabolic lymph nodes within the soft tissues of  the neck.  CHEST   Pulmonary nodule within the right lower lobe measures 1.6 x 1.8 cm.  The SUV max associated with this nodule is equal to 4.85. No  additional pulmonary nodule or mass identified. The heart size  appears normal. No pericardial effusion. There is no hypermetabolic  mediastinal or hilar lymph nodes identified.  ABDOMEN/PELVIS  No abnormal hypermetabolic activity within the liver, pancreas,  adrenal glands, or spleen. No hypermetabolic lymph nodes in the  abdomen or pelvis.  SKELETON  No focal hypermetabolic activity to suggest skeletal metastasis.  IMPRESSION:  1. Pulmonary nodule within the right lower lobe exhibits malignant  range FDG uptake and is suspicious for primary bronchogenic  carcinoma. Correlation with tissue sampling is recommended.  Electronically Signed  By: Kerby Moors M.D.  On: 08/04/2013 13:23  PFTs FVC= 2.89(81%) FEV1= 2.45(88%) TLC= 4.32(80%) DLCO= 84%  Impression:  Morbidly obese 58 yo nonsmoker with a right lower lobe nodule. This has progressed from a pure GGO in 2013 to a solid mass that is hypermetabolic on PET/CT. This is highly suspicious for a primary bronchogenic carcinoma arising from adenocarcinoma in situ. It has to e considered to be a lung cancer unless it can be proven otherwise. We discussed the possibility of bronchoscopic or CT guided biopsy. Unfortunately given her size and the peripheral nature of the lesion a negative result from either would not be considered reliable.  My recommendation was that we proceed with right VATS, wedge resection and possible segmentectomy or lobectomy depending on intraoperative findings.   We reviewed the indications, risks, benefits, and alternatives with Mrs and Mr Collings. We reviewed the general nature of the procedure, the need for general anesthesia, and the incisions to be used. We discussed the expected hospital stay, overall recovery and short and long term outcomes. They understand the risks include, but  are not limited to death, stroke, MI, DVT/PE, bleeding, possible need for transfusion, infections, cardiac arrhythmias, prolonged air leaks, as well as other organ system dysfunction including respiratory, renal, or GI complications. They understand she is at high risk for possible thrombotic complications due to her anti-phospholipid antibodies. They understand that minimally invasive surgery may be difficult or impossible given her size.  We also discussed possible cryoanalgesia of the intercostal nerves. We will attempt this if possible.  She wishes to have the surgery done in mid September and will call the office to schedule.  Plan: Right VATS, wedge resection, possible segmentectomy or lobectomy, possible cryoanalgesia of intercostal nerves.

## 2013-08-27 ENCOUNTER — Telehealth: Payer: Self-pay | Admitting: Cardiology

## 2013-08-27 DIAGNOSIS — R0602 Shortness of breath: Secondary | ICD-10-CM

## 2013-08-27 NOTE — Telephone Encounter (Signed)
New message     Patient calling stating she need to discussion her recent diagnosis of lung cancer dx .

## 2013-08-27 NOTE — Telephone Encounter (Signed)
Pt asking for Dr Aundra Dubin to review this report. She would like Dr Claris Gladden opinion about the coronary artery findings on this study.   She would also like for Dr Aundra Dubin to review the PET scan done in July 2015.  She saw Dr Roxan Hockey yesterday for surgical consultation and would like Dr Claris Gladden opinion about her surgical risk.  She would like to speak with Dr Aundra Dubin if possible. Cell phone # will be best #.

## 2013-08-27 NOTE — Telephone Encounter (Signed)
Pt advised Dr Aundra Dubin reviewing test results and Dr Aundra Dubin will follow up with her.

## 2013-08-27 NOTE — Telephone Encounter (Signed)
Pt states she has had PET scan and CT super D and has a 99.9% chance of having lung cancer.

## 2013-08-27 NOTE — Telephone Encounter (Signed)
Pt states there was some coronary calcifications noted on CT super D done 08/13/13.

## 2013-08-29 ENCOUNTER — Encounter: Payer: Self-pay | Admitting: *Deleted

## 2013-08-29 ENCOUNTER — Telehealth: Payer: Self-pay | Admitting: Internal Medicine

## 2013-08-29 NOTE — Telephone Encounter (Signed)
Follow up      Pt want you to know that Dr Aundra Dubin has not called her yet

## 2013-08-29 NOTE — Telephone Encounter (Signed)
   The PET scan showed no increased metabolic activity in the neck; repeat ultrasound should not be necessary with these findings.

## 2013-08-29 NOTE — Telephone Encounter (Signed)
Dr Aundra Dubin talked with patient. Dr Aundra Dubin recommended stress myoview. Pt advised.

## 2013-08-29 NOTE — Telephone Encounter (Signed)
Patient is calling in regards to [place] results from 11/29/11. Patient states that she was looking back at her result notes and saw where it read  "monitoring with Ultrasound & full thyroid function tests in 12 months would be appropriate." Patient wants to know if Dr. Linna Darner still finds it appropriate that she have this done since she never did. Please advise.

## 2013-09-01 NOTE — Telephone Encounter (Signed)
Patient has been advised

## 2013-09-02 ENCOUNTER — Encounter (HOSPITAL_COMMUNITY): Payer: BC Managed Care – PPO

## 2013-09-03 ENCOUNTER — Encounter: Payer: Self-pay | Admitting: Cardiology

## 2013-09-03 ENCOUNTER — Ambulatory Visit (HOSPITAL_COMMUNITY): Payer: BC Managed Care – PPO | Attending: Cardiology | Admitting: Radiology

## 2013-09-03 VITALS — BP 155/58 | HR 75 | Ht 66.0 in | Wt 273.0 lb

## 2013-09-03 DIAGNOSIS — I498 Other specified cardiac arrhythmias: Secondary | ICD-10-CM | POA: Insufficient documentation

## 2013-09-03 DIAGNOSIS — R0602 Shortness of breath: Secondary | ICD-10-CM | POA: Diagnosis not present

## 2013-09-03 DIAGNOSIS — R222 Localized swelling, mass and lump, trunk: Secondary | ICD-10-CM | POA: Insufficient documentation

## 2013-09-03 DIAGNOSIS — Z8249 Family history of ischemic heart disease and other diseases of the circulatory system: Secondary | ICD-10-CM | POA: Insufficient documentation

## 2013-09-03 DIAGNOSIS — R000 Tachycardia, unspecified: Secondary | ICD-10-CM | POA: Diagnosis not present

## 2013-09-03 DIAGNOSIS — Z01810 Encounter for preprocedural cardiovascular examination: Secondary | ICD-10-CM

## 2013-09-03 MED ORDER — TECHNETIUM TC 99M SESTAMIBI GENERIC - CARDIOLITE
33.0000 | Freq: Once | INTRAVENOUS | Status: AC | PRN
  Administered 2013-09-03: 33 via INTRAVENOUS

## 2013-09-03 NOTE — Progress Notes (Addendum)
[name] 3 NUCLEAR MED [address] [place], [place] [address] [phone]    Cardiology Nuclear Med Study  [name] is a 58 y.o. female     MRN : [id number]     DOB: [birth year]  Procedure Date: 09/03/2013  Nuclear Med Background Indication for Stress Test:  Evaluation for Ischemia, and Surgical Clearance for (R)Lung Mass by Crystal Charon, MD History:  No known CAD, hx. arial tachycardia, SVT, Echo 2014 EF 55-60% Cardiac Risk Factors: Family History - CAD and Lipids  Symptoms:  None indicated   Nuclear Pre-Procedure Caffeine/Decaff Intake:  None> 12 hrs NPO After: 9:30pm   Lungs:  clear O2 Sat: 95% on room air. IV 0.9% NS with Angio Cath:  22g  IV Site: R Antecubital x 1, tolerated well IV Started by:  Irven Baltimore, RN  Chest Size (in):  44 Cup Size: C  Height: 5\' 6"  (1.676 m)  Weight:  273 lb (123.832 kg)  BMI:  Body mass index is 44.08 kg/(m^2). Tech Comments:  N/A    Nuclear Med Study 1 or 2 day study: 2 day  Stress Test Type:  Stress  Reading MD: N/A  Order Authorizing Provider:  Loralie Champagne, MD  Resting Radionuclide: Technetium 56m Sestamibi  Resting Radionuclide Dose: 33.0 mCi  On     09-08-13  Stress Radionuclide:  Technetium 90m Sestamibi  Stress Radionuclide Dose: 33.0 mCi  On        09-03-13          Stress Protocol Rest HR: 75 Stress HR: 160  Rest BP: 155/58 Stress BP: 209/93  Exercise Time (min): 4:30 METS: 4.6           Dose of Adenosine (mg):  n/a Dose of Lexiscan: n/a mg  Dose of Atropine (mg): n/a Dose of Dobutamine: n/a mcg/kg/min (at max HR)  Stress Test Technologist: Glade Lloyd, BS-ES  Nuclear Technologist:  Vedia Pereyra, CNMT     Rest Procedure:  Myocardial perfusion imaging was performed at rest 45 minutes following the intravenous administration of Technetium 66m Sestamibi. Rest ECG: NSR - Normal EKG  Stress Procedure:  The patient exercised on the treadmill utilizing the Bruce Protocol for 4:30  minutes. The patient stopped due to SOB, fatigue and denied any chest pain.  Technetium 37m Sestamibi was injected at peak exercise and myocardial perfusion imaging was performed after a brief delay. Stress ECG: No significant change from baseline ECG  QPS Raw Data Images:  Normal; no motion artifact; normal heart/lung ratio. Stress Images:  anterior attenuation secondary to dense breast tissue.  Rest Images:  anterior attenuation secondary to dense breast tissue.  Subtraction (SDS):  No evidence of ischemia. Transient Ischemic Dilatation (Normal <1.22):  0.91 Lung/Heart Ratio (Normal <0.45):  0.23  Quantitative Gated Spect Images QGS EDV:  61 ml QGS ESV:  18 ml  Impression Exercise Capacity:  Fair exercise capacity. BP Response:  Hypertensive blood pressure response. Clinical Symptoms:  Mild chest pain/dyspnea. ECG Impression:  No significant ST segment change suggestive of ischemia. Comparison with Prior Nuclear Study: No images to compare  Overall Impression:  Normal stress nuclear study with fixed anterior defect secondary to breast attenuation.  LV Ejection Fraction: 71%.  LV Wall Motion:  NL LV Function; NL Wall Motion  [name] H 09/08/2013  Normal study with no evidence for ischemia or infarction. OK for surgery.   Loralie Champagne 09/09/2013 1:29 PM

## 2013-09-04 ENCOUNTER — Ambulatory Visit: Payer: BC Managed Care – PPO | Admitting: Emergency Medicine

## 2013-09-04 ENCOUNTER — Institutional Professional Consult (permissible substitution): Payer: BC Managed Care – PPO | Admitting: Emergency Medicine

## 2013-09-05 ENCOUNTER — Encounter: Payer: Self-pay | Admitting: Emergency Medicine

## 2013-09-05 ENCOUNTER — Ambulatory Visit (INDEPENDENT_AMBULATORY_CARE_PROVIDER_SITE_OTHER): Payer: BC Managed Care – PPO | Admitting: Emergency Medicine

## 2013-09-05 ENCOUNTER — Ambulatory Visit: Payer: BC Managed Care – PPO | Admitting: Emergency Medicine

## 2013-09-05 VITALS — BP 130/80 | HR 85 | Ht 66.0 in | Wt 277.0 lb

## 2013-09-05 DIAGNOSIS — R9389 Abnormal findings on diagnostic imaging of other specified body structures: Secondary | ICD-10-CM

## 2013-09-05 NOTE — Assessment & Plan Note (Signed)
Scheduled for surgical removal of her lung nodule w DR Roxan Hockey. Hopefully this will be resection for cure. We will need to follow CT scans to track her other nodule (has been stable).

## 2013-09-05 NOTE — Patient Instructions (Signed)
Your pulmonary function testing is normal without any clear evidence to support asthma.  Follow with Dr Roxan Hockey for surgery as planned Follow with Dr Lamonte Sakai in 6 months or sooner if you have any problems

## 2013-09-05 NOTE — Progress Notes (Signed)
Subjective:    Patient ID: [name], female    DOB: 1955/01/12, 58 y.o.   MRN: [id number]  HPI 58 yo woman, never smoker but significant passive exposure, hx of endometrial CA, anti-cardiolipin Ab syndrome, ? RAD. She has a family hx of lung CA (her mother). She has been followed for pulm nodules, first noted on an abdominal CT scan and then CT scan chest performed 03/2011.  There has been some change in appearance in the RLL nodule on serial CT; a RUL GG lesion is unchanged. She is referred to assess the nodule.   ROS > dyspnea, some R lower back discomfort   ROV 09/05/13 -- follows up for RLL GG nodule that has evolved on CT scan.  I asked her to see TCTS to consider primary resection > she saw Dr Roxan Hockey on 8/18 and they are planning for VATS wedge resection. She underwent PFT since last visit > largely normal, some suggestion of restriction base on decreased RV.  She has been doing well. No evidence of bronchospasm.    Review of Systems  Constitutional: Negative for fever and unexpected weight change.  HENT: Negative for congestion, dental problem, ear pain, nosebleeds, postnasal drip, rhinorrhea, sinus pressure, sneezing, sore throat and trouble swallowing.   Eyes: Negative for redness and itching.  Respiratory: Positive for cough, shortness of breath and wheezing. Negative for chest tightness.   Cardiovascular: Positive for palpitations. Negative for leg swelling.  Gastrointestinal: Negative for nausea and vomiting.  Genitourinary: Negative for dysuria.  Musculoskeletal: Negative for joint swelling.  Skin: Negative for rash.  Neurological: Negative for headaches.  Hematological: Does not bruise/bleed easily.  Psychiatric/Behavioral: Negative for dysphoric mood. The patient is not nervous/anxious.        Objective:   Physical Exam Filed Vitals:   09/05/13 1129  BP: 130/80  Pulse: 85  Height: 5\' 6"  (1.676 m)  Weight: 277 lb (125.646 kg)  SpO2: 94%   Gen: Pleasant,  obese woman, in no distress,  normal affect  ENT: No lesions,  mouth clear but dry,  oropharynx clear, no postnasal drip  Neck: No JVD, no TMG, no carotid bruits  Lungs: No use of accessory muscles, distant BS but clear.   Cardiovascular: RRR, heart sounds normal, no murmur or gallops, no peripheral edema  Musculoskeletal: No deformities, no cyanosis or clubbing  Neuro: alert, non focal  Skin: Warm, no lesions or rashes    CT chest 07/13/13 --  COMPARISON: Prior radiograph from 06/26/2012 as well as CT  03/29/2012.  FINDINGS:  Visualized thyroid gland is within normal limits.  No pathologically enlarged mediastinal, hilar, or axillary lymph  nodes are identified.  Intrathoracic aorta is of normal caliber and appearance. Great  vessels are within normal limits.  Heart size is normal. No pericardial effusion.  Pulmonary arterial tree is well opacified. No filling defect to  suggest acute pulmonary embolism identified. Re-formatted imaging  confirms these findings.  A in ovoid nodule measuring approximately 2.0 x 1.6 x 1.1 cm is seen  within the posterior-medial right lower lobe (series 7, image 76),  indeterminate. There is associated mild atelectasis with within the  adjacent lung. This was sub solid in appearance on prior study.  Subtle 6 mm ground-glass nodule within the right upper lobe  measuring 6 mm is stable from prior (series 7, image 20). No other  definite pulmonary nodule or mass.  No airspace consolidation or pulmonary edema. No pleural effusion.  The visualized portions of the upper  abdomen are within normal  limits.  No acute osseous abnormality. No worrisome lytic or blastic osseous  lesions.  IMPRESSION:  1. No CT evidence of acute pulmonary embolism.  2. 2.0 x 1.6 x 1.1 cm solid nodule within the posteromedial right  lower lobe. This lesion was sub solid/ground-glass in nature on  prior CT from 03/29/2012. Finding is indeterminate, but suspicious  for  possible primary adenocarcinoma. Further evaluation with PET-CT  is recommended.  3. Stable ground-glass nodule within the right upper lobe as above      Assessment & Plan:  Abnormal chest CT Scheduled for surgical removal of her lung nodule w DR Roxan Hockey. Hopefully this will be resection for cure. We will need to follow CT scans to track her other nodule (has been stable).

## 2013-09-08 ENCOUNTER — Ambulatory Visit (HOSPITAL_COMMUNITY): Payer: BC Managed Care – PPO | Attending: Cardiology

## 2013-09-08 DIAGNOSIS — R0989 Other specified symptoms and signs involving the circulatory and respiratory systems: Secondary | ICD-10-CM

## 2013-09-08 MED ORDER — TECHNETIUM TC 99M SESTAMIBI GENERIC - CARDIOLITE
30.0000 | Freq: Once | INTRAVENOUS | Status: AC | PRN
  Administered 2013-09-08: 30 via INTRAVENOUS

## 2013-09-09 NOTE — Progress Notes (Signed)
LMTCB

## 2013-09-10 NOTE — Progress Notes (Signed)
Pt.notified

## 2013-09-19 ENCOUNTER — Telehealth: Payer: Self-pay

## 2013-09-19 NOTE — Telephone Encounter (Signed)
Called pt, after reading Dr Agustina Caroli office note from 09/05/13, to see if she was ready to schedule a surgery date. [name] stated that she was not ready yet and would call Dr Hendrickson's office when she was.

## 2013-09-25 ENCOUNTER — Ambulatory Visit: Payer: BC Managed Care – PPO | Admitting: Internal Medicine

## 2013-09-26 ENCOUNTER — Encounter: Payer: Self-pay | Admitting: Internal Medicine

## 2013-09-26 ENCOUNTER — Ambulatory Visit (INDEPENDENT_AMBULATORY_CARE_PROVIDER_SITE_OTHER): Payer: BC Managed Care – PPO | Admitting: Internal Medicine

## 2013-09-26 VITALS — BP 124/82 | HR 77 | Temp 98.4°F | Wt 276.4 lb

## 2013-09-26 DIAGNOSIS — D320 Benign neoplasm of cerebral meninges: Secondary | ICD-10-CM

## 2013-09-26 DIAGNOSIS — D329 Benign neoplasm of meninges, unspecified: Secondary | ICD-10-CM

## 2013-09-26 DIAGNOSIS — R918 Other nonspecific abnormal finding of lung field: Secondary | ICD-10-CM

## 2013-09-26 NOTE — Progress Notes (Signed)
Pre visit review using our clinic review tool, if applicable. No additional management support is needed unless otherwise documented below in the visit note. 

## 2013-09-26 NOTE — Progress Notes (Signed)
   Subjective:    Patient ID: [name], female    DOB: Mar 23, 1955, 58 y.o.   MRN: [id number]  HPI    She she is here to discuss her pending thoracic surgery 10/06/13 at [facility].  There are 2 pulmonary nodules; one of which has been stable on serial imaging. She hass seen the [employer] who feels bronchoscopy would be low yield and a  percutaneous needle aspiration possibly nondiagnostic and high risk because of the location (inferior RLL) of the nodule.  She has some symptoms of Sjogren's syndrome and was found to have been a meningioma as an incidental finding during the preop evaluation for possible metastatic disease.  She questions the timing of evaluating these additional factors in relation to the surgery      Review of Systems   She does have some minor headaches and some dizziness.  She describes dry eyes, increase in caries formation, and dysfunctional left parotid gland.       Objective:   Physical Exam   Exam was not completed.       Assessment & Plan:  #1 pulmonary nodule; I supported her decision to have this resected at [facility] as soon as possible  #2 Sjogren syndrome; I recommended evaluation by Dr. Gale Journey at [facility] if her cardiothoracic surgeon was concerned about its impact on the surgery  #3 meningioma is a incidental finding; she does have some headache and some dizziness. Referral to Neurology will be pursued after the nodule is resected.  There is a smaller stable nodule;potential  resection will be evaluated at the time of the surgery  All her concerns & questions and those of her husband were addressed.

## 2013-09-26 NOTE — Patient Instructions (Signed)
Please do the mental exercise we discussed over 5 days and spiritual consideration for 24 hours. Whatever you decide will be the correct answer.

## 2013-09-27 DIAGNOSIS — D329 Benign neoplasm of meninges, unspecified: Secondary | ICD-10-CM | POA: Insufficient documentation

## 2013-10-14 ENCOUNTER — Ambulatory Visit (INDEPENDENT_AMBULATORY_CARE_PROVIDER_SITE_OTHER): Payer: BC Managed Care – PPO | Admitting: Physician Assistant

## 2013-10-14 ENCOUNTER — Encounter: Payer: Self-pay | Admitting: Physician Assistant

## 2013-10-14 ENCOUNTER — Other Ambulatory Visit: Payer: Self-pay

## 2013-10-14 ENCOUNTER — Telehealth: Payer: Self-pay | Admitting: Cardiology

## 2013-10-14 ENCOUNTER — Ambulatory Visit: Payer: BC Managed Care – PPO | Admitting: Physician Assistant

## 2013-10-14 VITALS — BP 122/74 | HR 65 | Ht 66.0 in | Wt 270.0 lb

## 2013-10-14 DIAGNOSIS — R918 Other nonspecific abnormal finding of lung field: Secondary | ICD-10-CM

## 2013-10-14 DIAGNOSIS — R002 Palpitations: Secondary | ICD-10-CM | POA: Diagnosis not present

## 2013-10-14 DIAGNOSIS — IMO0001 Reserved for inherently not codable concepts without codable children: Secondary | ICD-10-CM | POA: Insufficient documentation

## 2013-10-14 DIAGNOSIS — C541 Malignant neoplasm of endometrium: Secondary | ICD-10-CM

## 2013-10-14 DIAGNOSIS — R911 Solitary pulmonary nodule: Secondary | ICD-10-CM | POA: Insufficient documentation

## 2013-10-14 DIAGNOSIS — Z136 Encounter for screening for cardiovascular disorders: Secondary | ICD-10-CM

## 2013-10-14 DIAGNOSIS — Z8542 Personal history of malignant neoplasm of other parts of uterus: Secondary | ICD-10-CM | POA: Insufficient documentation

## 2013-10-14 DIAGNOSIS — E669 Obesity, unspecified: Secondary | ICD-10-CM

## 2013-10-14 DIAGNOSIS — D689 Coagulation defect, unspecified: Secondary | ICD-10-CM

## 2013-10-14 DIAGNOSIS — J45909 Unspecified asthma, uncomplicated: Secondary | ICD-10-CM | POA: Insufficient documentation

## 2013-10-14 MED ORDER — METOPROLOL TARTRATE 12.5 MG HALF TABLET: 12.5000 mg | ORAL_TABLET | Freq: Two times a day (BID) | ORAL | Status: DC

## 2013-10-14 MED ORDER — METOPROLOL TARTRATE 25 MG PO TABS
12.5000 mg | ORAL_TABLET | Freq: Two times a day (BID) | ORAL | Status: DC
Start: 2013-10-14 — End: 2014-12-29

## 2013-10-14 NOTE — Progress Notes (Signed)
Cardiology Office Note    Date:  10/14/2013   ID:  Nyesha, Cliff 11/18/1955, MRN [id number]  PCP:  Unice Cobble, MD  Cardiologist: Dr. Aundra Dubin      History of Present Illness: [name] is a 58 y.o. female with a complex medical history as outlined below. She has a history of antiphospholipid antibody syndrome, prior endometrial cancer, and palpitations with brief runs of atrial tachycardia as well as frequent PACs on past monitoring. She was added on to the office schedule today for evaluation of palpitations after recent surgery.   She had a negative stress echo in 2011. 3-week monitor in 12/14 (due to palpitations) showed short runs of SVT, no atrial fibrillation. Echo showed EF 55-60% with no significant abnormalities. Seen by Dr. Aundra Dubin in Feb 2015 and palpitations had significantly improved at that time on metoprolol 25 mg bid. She was then weaned off her metoprolol due to concern that is was causing numbness in her hands. It was finally discontinued in 06/2013. She had a preoperative nuclear stress test in 09/2013 which was non ischemic.   She was recently seen at [facility] for a RLL nodule that was hypermetabolic on CT scan. She underwent thoracoscopy with therapeutic wedge resection, right lobectomy and mediastinal and regional lymphadenectomy on 10/06/13. Pathology is pending. During that hospitalization she had PACs and palpitations. She thought they may become less frequent as she recovered from surgery but she continues to have them regularly. They are quite bothersome and occur approximately 25 times per day. The "scare" her and make her feel as though she might pass out.   Past Medical History:  1. 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 CMV pneumonia 1993  3. Colitis 2006, ? ischemic  4. Clotting disorder followed by Dr Marin Olp (sounds like antiphospholipid antibody syndrome given positive anticardiolipin antibody and positve beta-2 glycoprotetin tests). ASA 81 mg once daily.  Has had superficial thrombophlebitis L leg (6/13 ultrasound).  5. Edema LLE post ACL sprain 01/2008  6. Allergic rhinitis  7. Hyperlipidemia  8. ?RAD wheezes with RTIs  9. Colonic polyps, hx of  10. Diverticulosis, colon  11. Endometrial cancer s/p hysterectomy  12. Obesity  13. Stress echo (10/11): EF 60-65%, no significant valvular abnormalities. No wall motion abnormalities with exertion, suggesting no ischemia. Echo (12/14) with EF 55-60%, no significant abnormalities.  14. Palpitations: Holter (10/11): 9.6% of beats were atrial ectopy, average HR 78, short runs of up to 5-6 beats of probable atrial tachycaria. 3 week event monitor (12/14) showed short runs of SVT rarely, no atrial fibrillation.     Studies:  - Echo (12/14) with EF 55-60%, no significant abnormalities.   - Nuclear (09/09/13): no evidence for ischemia or infarction with fixed anterior defect secondary to breast attenuation. EF 71%.   Recent Labs/Images:   Recent Labs  12/09/12 1257  07/12/13 2359 08/19/13 0900  NA 141  --  141  --   K 4.0  --  4.4  --   BUN 14  --  14  --   CREATININE 0.9  --  1.02  --   ALT  --   < > 27 27  HGB  --   --  13.8  --   TSH 2.53  --   --   --   LDLCALC  --   --   --  113*  HDL  --   --   --  50.60  PROBNP 34.0  --   --   --   < > =  values in this interval not displayed.   Ct Super D Chest Wo Contrast  08/13/2013   CLINICAL DATA:  Nearly diagnosis Lung lesions suspicious for bronchogenic carcinoma.  EXAM: CT CHEST WITHOUT CONTRAST  TECHNIQUE: Multidetector CT imaging of the chest was performed using thin slice collimation for electromagnetic bronchoscopy planning purposes, without intravenous contrast.  COMPARISON:  PET-CT 08/04/2013.  FINDINGS: Mediastinum: Heart size is normal. There is no significant pericardial fluid, thickening or pericardial calcification. There is atherosclerosis of the thoracic aorta, the great vessels of the mediastinum and the coronary arteries, including  calcified atherosclerotic plaque in the left circumflex and right coronary arteries. No pathologically enlarged mediastinal or hilar lymph nodes. Please note that accurate exclusion of hilar adenopathy is limited on noncontrast CT scans. Esophagus is unremarkable in appearance.  Lungs/Pleura: In the medial aspect of the right lower lobe (image 299 of series 4) there is again a pleural-based nodule measuring 2.1 x 1.5 cm, with small amount of surrounding ground-glass attenuation. This is immediately adjacent to a small subsegmental size bronchus in the medial aspect of the posterobasal segment of the right lower lobe. Nonspecific 6 mm ground-glass attenuation nodule in the apex of the right upper lobe (image 62 of series 4) is unchanged. No other suspicious appearing pulmonary nodules or masses. No acute consolidative airspace disease. No pleural effusions.  Upper Abdomen: Status post cholecystectomy. Otherwise, unremarkable.  Musculoskeletal: There are no aggressive appearing lytic or blastic lesions noted in the visualized portions of the skeleton.  IMPRESSION: 1. No change in 2.1 x 1.5 cm right lower lobe nodule, which is immediately adjacent to a small subsegmental sized bronchus in the medial aspect of the posterobasal segment of the right lower lobe. 2. Nonspecific 6 mm ground-glass attenuation nodule in the apex of the right upper lobe is also unchanged, and similar in retrospect to remote prior studies dating back to 08/08/2011, likely benign. 3. Atherosclerosis, including 2 vessel coronary artery disease. Please note that although the presence of coronary artery calcium documents the presence of coronary artery disease, the severity of this disease and any potential stenosis cannot be assessed on this non-gated CT examination. Assessment for potential risk factor modification, dietary therapy or pharmacologic therapy may be warranted, if clinically indicated.   Electronically Signed   By: Vinnie Langton  M.D.   On: 08/13/2013 15:24     Wt Readings from Last 3 Encounters:  10/14/13 270 lb (122.471 kg)  09/26/13 276 lb 6 oz (125.363 kg)  09/05/13 277 lb (125.646 kg)     Past Medical History  Diagnosis Date  . Encephalitis age 39    with coma  . CMV pneumonia     CMV pneumonia 1994  . Colitis 08/2004    ?Ischemic colitis in 2006.  . Endometrial cancer     a. S/P Hysterectomy and BSO  . Obesity   . Clotting disorder     a. Anti-cardiolipin antibody syndrome (IgM anticardiolipin, B2-glycoprotein IgM). b. Has had superficial thr. phebitis 06/2011,  . Allergic rhinitis   . Colon polyps   . Diverticulosis     Has had diverticulitis x 2  . Hemorrhoids   . Palpitations     a. Holter (10/11): 9.6% of beats were atrial ectopy, average HR 78, short runs of up to 5-6 beats of probable atrial tachycardia.   . Other and unspecified hyperlipidemia 06/22/2007  . BENIGN POSITIONAL VERTIGO 03/03/2010  . HIATAL HERNIA 05/26/2009  . 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 Superficial thrombophlebitis   . Edema     a. LLE post ACL sprain 2010.  Marland Kitchen Reactive airway disease     a. ?RAD wheezes with respiratory infections.  . Normal cardiac stress test     a. Stress echo (10/11): EF 60-65%, no significant valvular abnormalities. No wall motion abnormalities with exertion, suggesting no ischemia. b. normal nuclear stress test in 09/2013 done for pre-op   . Pulmonary nodules     a. Followed periodically by CT (Last in 03/2012).  . Thyroid nodule     a. Small bilat thyroid nodule by [place] 2013, periodically followed.    Current Outpatient Prescriptions  Medication Sig Dispense Refill  . aspirin 81 MG tablet Take 81 mg by mouth daily.        Marland Kitchen atorvastatin (LIPITOR) 20 MG tablet TAKE 1 TABLET (20 MG TOTAL) BY MOUTH DAILY.  30 tablet  11  . Cholecalciferol (VITAMIN D3) 2000 UNITS TABS Take 2,000 Units by mouth daily.      . Flaxseed, Linseed, (FLAX SEED OIL) 1000 MG CAPS Take  1,000 mg by mouth daily.        . Magnesium 250 MG TABS Take 1 tablet by mouth daily as needed.       . Multiple Vitamin (MULTIVITAMIN) tablet Take 1 tablet by mouth daily.        Marland Kitchen oxyCODONE (OXY IR/ROXICODONE) 5 MG immediate release tablet Take by mouth. As directed      . polyethylene glycol (MIRALAX / GLYCOLAX) packet Take by mouth. As directed      . senna-docusate (SENOKOT-S) 8.6-50 MG per tablet Take by mouth. As directed      . vitamin C (ASCORBIC ACID) 500 MG tablet Take by mouth. As directed      . metoprolol tartrate (LOPRESSOR) 12.5 mg TABS tablet Take 0.5 tablets (12.5 mg total) by mouth 2 (two) times daily.  90 tablet  3   No current facility-administered medications for this visit.     Allergies:   Vioxx and Sulfa antibiotics   Social History:  The patient  reports that she has been passively smoking.  She has never used smokeless tobacco. She reports that she does not drink alcohol or use illicit drugs.   Family History:  The patient's family history includes Asthma in her father and sister; Atrial fibrillation in her mother and another family member; Colon cancer in her paternal grandmother; Colon polyps in her father; Deep vein thrombosis in her maternal grandmother and mother; Diabetes in her father; Diverticulitis in her father; Heart attack in her maternal grandmother; Heart attack (age of onset: 1) in her mother; Heart failure in her maternal grandmother, paternal grandfather, and another family member; Liver disease in her father; Lung cancer in her mother; Prostate cancer in her maternal grandfather; Stroke in an other family member; Sudden death in her paternal grandfather.   ROS:  Please see the history of present illness.  All other systems reviewed and negative.    PHYSICAL EXAM: VS:  BP 122/74  Pulse 65  Ht 5\' 6"  (1.676 m)  Wt 270 lb (122.471 kg)  BMI 43.60 kg/m2 Well nourished, well developed, in no acute distress. Obese HEENT: normal Neck: no  JVD Cardiac:  normal S1, S2; RRR; no murmur Lungs:  clear to auscultation bilaterally, no wheezing, rhonchi or rales Abd: soft, nontender, no hepatomegaly Ext: no edema Skin: warm and dry Neuro:  CNs 2-12 intact, no focal abnormalities noted  EKG:  NSR:  HR 65.  NO PACs or atrial fibrillation     ASSESSMENT AND PLAN: DAINELLE HUN is a 58 y.o. female with a complex medical history as outlined above. She has a history of antiphospholipid antibody syndrome, prior endometrial cancer, and palpitations with brief runs of atrial tachycardia as well as frequent PACs on past monitoring. She was added on to the office schedule today for evaluation of palpitations after recent surgery.   Palpitations: Previously quiescent on metoprolol 25mg  BID. Previous event monitor showed short runs of SVT, no atrial fibrillation. Echo (12/2012) showed normal EF and no significant valvular abnormalities. She was then weaned off her metoprolol due to concern that is was causing numbness in her hands. It was finally discontinued in 06/2013. Now having recurrent palpitations starting after recent cardiothoracic surgery on 10/06/13. -- ECG today reveals NSR with no evidence of afib or PACs. -- Will resume metoprolol but at 12.5 mg BID as her HR is only 65 today and BP  122/74. -- Will order a BMET, TSH and mag today -- If palpitations persist we could consider another event monitor to rule out other arrhythmias   Hyperlipidemia: She is on atorvastatin given elevated LDL, Lp(a), and family history of premature CAD.  -- 08/19/13: TC 187. TG 117. HDL 50. LDL: 113  Obesity- diet and exercise for weight loss.  Clotting disorder followed by Dr Marin Olp (sounds like antiphospholipid antibody syndrome given positive anticardiolipin antibody and positve beta-2 glycoprotetin tests). Has had superficial thrombophlebitis L leg (6/13 ultrasound).  -- ASA 81 mg once daily.   Lung nodule She was recently seen at [facility] for a RLL nodule  that was hypermetabolic on CT scan. She underwent thoracoscopy with therapeutic wedge resection, right lobectomy and mediastinal and regional lymphadenectomy on 10/06/13 -- Pathology pending; further per [facility].    Meningioma - incidental finding during the preop evaluation for possible metastatic disease; she does have some headache and dizziness.  -- Referral to Neurology by PCP   Disposition: FU with with Dr. Aundra Dubin in ~6 weeks    Signed, Vesta Mixer, PA-C, MHS 10/14/2013 3:40 PM    [facility], [place], [place]  [address] Phone: [phone]; Fax: [phone]

## 2013-10-14 NOTE — Patient Instructions (Addendum)
Your physician has recommended you make the following change in your medication: decrease Metoprolol to 12.5 mg twice daily  Your physician recommends that you return for lab work in: today (BMET, TSH and Mag)  Your physician recommends that you schedule a follow-up appointment in: 6 weeks  Please call the office at [phone] if you have any problems in the meantime.

## 2013-10-14 NOTE — Telephone Encounter (Signed)
Pt states she had surgery for lung nodule at [facility] 10/06/13. During that hospitalization she had  PACs and palpitations.  She thought they may become less frequent as she recovered from surgery but she continues to have them regularly.

## 2013-10-14 NOTE — Telephone Encounter (Signed)
New message    Patient calling C/O having problem with arrythmia. Need to discuss medication.

## 2013-10-14 NOTE — Telephone Encounter (Signed)
She has been off Metoprolol since June 2015 because of numbness in her hands. Pt asking if she should come for an EKG. Pt advised I have scheduled  appt today at [facility] with flex APP.

## 2013-10-15 LAB — BASIC METABOLIC PANEL
BUN: 11 mg/dL (ref 6–23)
CO2: 25 mEq/L (ref 19–32)
CREATININE: 1 mg/dL (ref 0.4–1.2)
Calcium: 9 mg/dL (ref 8.4–10.5)
Chloride: 105 mEq/L (ref 96–112)
GFR: 60.06 mL/min (ref >60.00)
Glucose, Bld: 85 mg/dL (ref 70–99)
Potassium: 4.3 mEq/L (ref 3.5–5.1)
SODIUM: 139 meq/L (ref 135–145)

## 2013-10-15 LAB — MAGNESIUM: MAGNESIUM: 2.1 mg/dL (ref 1.5–2.5)

## 2013-10-15 LAB — TSH: TSH: 4.71 u[IU]/mL — AB (ref 0.35–4.50)

## 2013-10-16 ENCOUNTER — Telehealth: Payer: Self-pay | Admitting: *Deleted

## 2013-10-16 DIAGNOSIS — R946 Abnormal results of thyroid function studies: Secondary | ICD-10-CM

## 2013-10-16 NOTE — Telephone Encounter (Signed)
pt notified of lab results with verbal understanding . Will repeat TSH, FREE T4 10/30 with FLP/LFT that were scheduled already that day.

## 2013-11-07 ENCOUNTER — Other Ambulatory Visit: Payer: BC Managed Care – PPO

## 2013-11-10 ENCOUNTER — Other Ambulatory Visit (INDEPENDENT_AMBULATORY_CARE_PROVIDER_SITE_OTHER): Payer: BC Managed Care – PPO | Admitting: *Deleted

## 2013-11-10 DIAGNOSIS — R946 Abnormal results of thyroid function studies: Secondary | ICD-10-CM

## 2013-11-10 DIAGNOSIS — E785 Hyperlipidemia, unspecified: Secondary | ICD-10-CM

## 2013-11-10 LAB — HEPATIC FUNCTION PANEL
ALK PHOS: 129 U/L — AB (ref 39–117)
ALT: 27 U/L (ref 0–35)
AST: 23 U/L (ref 0–37)
Albumin: 3.2 g/dL — ABNORMAL LOW (ref 3.5–5.2)
BILIRUBIN DIRECT: 0.1 mg/dL (ref 0.0–0.3)
BILIRUBIN TOTAL: 0.4 mg/dL (ref 0.2–1.2)
Total Protein: 6.8 g/dL (ref 6.0–8.3)

## 2013-11-10 LAB — T4, FREE: Free T4: 0.84 ng/dL (ref 0.60–1.60)

## 2013-11-10 LAB — LIPID PANEL
CHOLESTEROL: 169 mg/dL (ref 0–200)
HDL: 48.1 mg/dL (ref >39.00)
LDL Cholesterol: 101 mg/dL — ABNORMAL HIGH (ref 0–99)
NONHDL: 120.9
Total CHOL/HDL Ratio: 4
Triglycerides: 101 mg/dL (ref 0.0–149.0)
VLDL: 20.2 mg/dL (ref 0.0–40.0)

## 2013-11-10 LAB — TSH: TSH: 3.96 u[IU]/mL (ref 0.35–4.50)

## 2013-12-12 ENCOUNTER — Ambulatory Visit (INDEPENDENT_AMBULATORY_CARE_PROVIDER_SITE_OTHER): Payer: BC Managed Care – PPO | Admitting: Cardiology

## 2013-12-12 ENCOUNTER — Encounter: Payer: Self-pay | Admitting: Cardiology

## 2013-12-12 VITALS — BP 100/76 | HR 72 | Ht 66.0 in | Wt 271.4 lb

## 2013-12-12 DIAGNOSIS — I2510 Atherosclerotic heart disease of native coronary artery without angina pectoris: Secondary | ICD-10-CM

## 2013-12-12 DIAGNOSIS — E669 Obesity, unspecified: Secondary | ICD-10-CM

## 2013-12-12 DIAGNOSIS — E785 Hyperlipidemia, unspecified: Secondary | ICD-10-CM

## 2013-12-12 DIAGNOSIS — R002 Palpitations: Secondary | ICD-10-CM

## 2013-12-12 MED ORDER — ATORVASTATIN CALCIUM 40 MG PO TABS: 40.0000 mg | ORAL_TABLET | Freq: Every day | ORAL | Status: DC

## 2013-12-12 NOTE — Patient Instructions (Signed)
Increase atorvastatin to 40mg  daily.   Your physician recommends that you return for a FASTING lipid profile /liver profile in 2 months.   Your physician wants you to follow-up in: 6 months with Dr Aundra Dubin. (June 2016). You will receive a reminder letter in the mail two months in advance. If you don't receive a letter, please call our office to schedule the follow-up appointment.

## 2013-12-14 DIAGNOSIS — E785 Hyperlipidemia, unspecified: Secondary | ICD-10-CM | POA: Insufficient documentation

## 2013-12-14 DIAGNOSIS — I2510 Atherosclerotic heart disease of native coronary artery without angina pectoris: Secondary | ICD-10-CM | POA: Insufficient documentation

## 2013-12-14 NOTE — Progress Notes (Signed)
Patient ID: [name], female   DOB: 1955/01/12, 58 y.o.   MRN: [id number] PCP: Dr. Linna Darner  58 yo with history of antiphospholipidphospholipid antibody syndrome, prior endometrial cancer, and palpitations with brief runs of atrial tachycardia as well as frequent PACs on past monitoring presents for cardiology followup.   3-week monitor in 12/14 (due to palpitations) showed short runs of SVT, no atrial fibrillation.  Echo showed EF 55-60% and did not have significant abnormality.  ETT-Cardiolite in 8/15 showed no ischemia or infarction.    Since I saw her last, she was found to have a hypermetabolic nodule in the RLL on PET.  She had a right lobectomy at [facility].  Pathology showed a form of lymphoma.  She will have a repeat CT in 1/15.  Palpitations are mostly suppressed by metoprolol.  No chest pain, no dyspnea.  Weight is down 4 lbs.    Labs (7/11): HCT 40.6, K 4.3, creatinine 0.6, TSH normal, free T4 normal  Labs (10/11): K 4.8, creatinine 1.1, LFTs normal, LDL 158, HDL 58  Labs (1/12): LDL 150, Lp(a) 104 Labs (11/13): LDL 93, HDL 51 Labs (6/14): K 3.8, creatinine 0.91 Labs (7/14): LDL 90, HDL 50 Labs (12/14): K 4, creatinine 0.9, BNP 34, TSH normal Labs (10/15): K 4.3, creatinine 1.0 Labs (11/15): LDL 101, HDL 48, TSH normal, LFTs normal  Past Medical History:  1. Encephalitis with coma age 35  2. CMV pneumonia 1993  3. Colitis 2006, ? ischemic  4. Clotting disorder followed by Dr Marin Olp (sounds like antiphospholipid antibody syndrome given positive anticardiolipin antibody and positve beta-2 glycoprotetin tests). ASA 81 mg once daily.  Has had superficial thrombophlebitis L leg (6/13 ultrasound).   5. Edema LLE post ACL sprain 01/2008  6. Allergic rhinitis  7. Hyperlipidemia  8. ?RAD wheezes with RTIs  9. Colonic polyps, hx of  10. Diverticulosis, colon  11. Endometrial cancer s/p hysterectomy  12. Obesity  13. CAD: Stress echo (10/11) with EF 60-65%, no significant valvular abnormalities. No  wall motion abnormalities with exertion, suggesting no ischemia.  Echo (12/14) with EF 55-60%, no significant abnormalities.  ETT-Cardiolite (8/15) with EF 71%, no ischemia or infarction. CTs recently have shown calcified coronaries.  14. Palpitations: Holter (10/11): 9.6% of beats were atrial ectopy, average HR 78, short runs of up to 5-6 beats of probable atrial tachycaria.  3 week event monitor (12/14) showed short runs of SVT rarely, no atrial fibrillation.  15. Meningioma 16. Lymphoma: Nodule found in RLL, hypermetabolic by PET. She had right lobectomy with pathology showing a form of lymphoma.   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 Husband had liver transplant.   ROS: All systems reviewed and negative except as per HPI.   Current Outpatient Prescriptions  Medication Sig Dispense Refill  . aspirin 81 MG tablet Take 81 mg by mouth daily.      . Cholecalciferol (VITAMIN D3) 2000 UNITS TABS Take 2,000 Units by mouth daily.    . Flaxseed, Linseed, (FLAX SEED OIL) 1000 MG CAPS Take 1,000 mg by mouth daily.      . Magnesium 250 MG TABS Take 1 tablet by mouth daily as needed.     . metoprolol tartrate (LOPRESSOR) 25 MG tablet Take 0.5 tablets (12.5 mg total)  by mouth 2 (two) times daily. 90 tablet 3  . Multiple Vitamin (MULTIVITAMIN) tablet Take 1 tablet by mouth daily.      . vitamin C (ASCORBIC ACID) 500 MG tablet Take by mouth. As directed    . atorvastatin (LIPITOR) 40 MG tablet Take 1 tablet (40 mg total) by mouth daily. 30 tablet 3   No current facility-administered medications for this visit.    BP 100/76 mmHg  Pulse 72  Ht 5\' 6"  (1.676 m)  Wt 271 lb 6.4 oz (123.106 kg)  BMI 43.83 kg/m2  SpO2 96% General: NAD, overweight Neck: No JVD, no thyromegaly or thyroid nodule.  Lungs:  Slight crackles right base.  CV: Nondisplaced PMI.  Heart regular S1/S2, no S3/S4, no murmur.  No edema.  No carotid bruit.  Normal pedal pulses.  Abdomen: Soft, nontender, no hepatosplenomegaly, no distention.   Neurologic: Alert and oriented x 3.  Psych: Normal affect. Extremities: No clubbing or cyanosis.   Assessment/Plan: 1. Palpitations: Resolved on metoprolol.  Event monitor showed short runs of SVT, no atrial fibrillation.  Echo showed normal EF and no significant valvular abnormalities.  2. Hyperlipidemia: Give extensive coronary calcification, I would like to see LDL < 70.  Increase atorvastatin to 40 mg daily with lipids/LFTs in 2 months.  3. Obesity: I strongly encouraged diet and exercise for weight loss. 4. CAD: Coronary calcification on CT.  As above, will increase statin.  Continue ASA 81 daily.     Loralie Champagne 12/14/2013

## 2013-12-15 NOTE — Addendum Note (Signed)
Addended by: Katrine Coho on: 12/15/2013 10:36 AM   Modules accepted: Orders

## 2013-12-22 ENCOUNTER — Ambulatory Visit (INDEPENDENT_AMBULATORY_CARE_PROVIDER_SITE_OTHER): Payer: BC Managed Care – PPO | Admitting: Internal Medicine

## 2013-12-22 ENCOUNTER — Ambulatory Visit (INDEPENDENT_AMBULATORY_CARE_PROVIDER_SITE_OTHER)
Admission: RE | Admit: 2013-12-22 | Discharge: 2013-12-22 | Disposition: A | Discharge status: Home / Self Care | Payer: BC Managed Care – PPO | Source: Ambulatory Visit | Attending: Internal Medicine | Admitting: Internal Medicine

## 2013-12-22 ENCOUNTER — Encounter: Payer: Self-pay | Admitting: Internal Medicine

## 2013-12-22 ENCOUNTER — Telehealth: Payer: Self-pay | Admitting: Internal Medicine

## 2013-12-22 VITALS — BP 120/82 | HR 74 | Temp 98.1°F | Wt 274.1 lb

## 2013-12-22 DIAGNOSIS — R0689 Other abnormalities of breathing: Secondary | ICD-10-CM

## 2013-12-22 DIAGNOSIS — R0989 Other specified symptoms and signs involving the circulatory and respiratory systems: Secondary | ICD-10-CM

## 2013-12-22 DIAGNOSIS — R0781 Pleurodynia: Secondary | ICD-10-CM

## 2013-12-22 NOTE — Progress Notes (Signed)
Pre visit review using our clinic review tool, if applicable. No additional management support is needed unless otherwise documented below in the visit note. 

## 2013-12-22 NOTE — Patient Instructions (Signed)
Please take 3 enteric-coated aspirin 81 mg daily with breakfast.  Your next office appointment will be determined based upon review of your pending  x-rays. Those instructions will be transmitted to you through My Chart   Followup as needed for your acute issue. Please report any significant change in your symptoms.

## 2013-12-22 NOTE — Progress Notes (Signed)
   Subjective:    Patient ID: [name], female    DOB: September 17, 1955, 58 y.o.   MRN: [id number]  HPI   She had the pulmonary nodule resected at [facility] 10/06/13 which was reported as Lymphoma. No chemotherapy initiated as resection felt to be total.  In the last week she's noted "crackles and pops" with deep breathing on the right. She also has some tenderness over the inferior rib cage on that side in the midaxillary line.  She's continued to have scant sputum which she describes as stable in amount.  She has no other constitutional or upper respiratory tract symptoms.   Review of Systems   Specifically she denies pleuritic type pain, shortness of breath, wheezing, or hemoptysis.  She's had no fever chills or sweats.  Frontal headache, facial pain , nasal purulence, dental pain, sore throat , otic pain or otic discharge denied.      Objective:   Physical Exam  General appearance:  As per CDC Guidelines ,Epic documents severe obesity as being present .Adequately nourished; no acute distress or increased work of breathing is present.  No  lymphadenopathy about the head, neck, or axilla noted.   Eyes: No conjunctival inflammation or lid edema is present. There is no scleral icterus.  Ears:  External ear exam shows no significant lesions or deformities.  Otoscopic examination reveals clear canals, tympanic membranes are intact bilaterally without bulging, retraction, inflammation or discharge.  Nose:  External nasal examination shows no deformity or inflammation. Nasal mucosa are pink and moist without lesions or exudates. No septal dislocation or deviation.No obstruction to airflow.   Oral exam: Dental hygiene is good; lips and gums are healthy appearing.There is no oropharyngeal erythema or exudate noted.   Neck:  No deformities, thyromegaly, masses, or tenderness noted.   Supple with full range of motion without pain.   Heart:  Normal rate and regular rhythm. S1 and S2 normal  without gallop, murmur, click, rub or other extra sounds.   Lungs: A course rub is noted over the right posterior inferior thorax. There is tenderness to palpation over the right inferior rib cage in the midaxillary line.   She has no wheezing or rhonchi; there is no increased work of breathing.  Extremities:  No cyanosis, edema, or clubbing  noted    Skin: Warm & dry w/o jaundice or tenting.        Assessment & Plan:  #1 abnormal breath sounds with rub  #2 rib pain  Plan: See orders and recommendations

## 2013-12-22 NOTE — Telephone Encounter (Signed)
Please FAX office visit & Xray results to Dr Mosie Epstein , [facility]

## 2013-12-22 NOTE — Telephone Encounter (Signed)
Dr Linna Darner, have you reviewed report yet so it can be faxed

## 2013-12-22 NOTE — Telephone Encounter (Signed)
Pt called stated she request for the result of her x-ray that was done today to be send to Dr. Marlane Mingle phone [phone] at [facility]. Pt stated they want to see the film. Please advise.

## 2013-12-23 NOTE — Telephone Encounter (Signed)
Information has been faxed to [phone]

## 2014-02-09 ENCOUNTER — Telehealth: Payer: Self-pay | Admitting: Cardiology

## 2014-02-09 NOTE — Telephone Encounter (Signed)
Pt advised/appt rescheduled to 03/12/14

## 2014-02-09 NOTE — Telephone Encounter (Signed)
Let's delay x 1 month.

## 2014-02-09 NOTE — Telephone Encounter (Signed)
Pt advised she was scheduled for lipid/liver profile after atorvastatin was increased to 40mg  12/12/13.   Pt states because of travel and insurance she missed 7-10 days of atorvastatin about 1 week ago.  Until about a week ago she had been taking it regularly.   Pt asking since she missed 7-10 days of atorvastatin a week ago if she should delay lab for lipid/liver scheduled for 02/12/14.   Pt advised I will forward to Dr Aundra Dubin for review and recommendations.

## 2014-02-09 NOTE — Telephone Encounter (Signed)
New problem   Pt need to know what type of labs does she need to have for her appt on 02/12/14. Please advise pt.

## 2014-02-12 ENCOUNTER — Other Ambulatory Visit: Payer: BC Managed Care – PPO

## 2014-03-02 ENCOUNTER — Encounter: Payer: Self-pay | Admitting: Internal Medicine

## 2014-03-12 ENCOUNTER — Other Ambulatory Visit (INDEPENDENT_AMBULATORY_CARE_PROVIDER_SITE_OTHER): Payer: Self-pay | Admitting: *Deleted

## 2014-03-12 DIAGNOSIS — E785 Hyperlipidemia, unspecified: Secondary | ICD-10-CM

## 2014-03-12 LAB — HEPATIC FUNCTION PANEL
ALBUMIN: 3.8 g/dL (ref 3.5–5.2)
ALT: 27 U/L (ref 0–35)
AST: 25 U/L (ref 0–37)
Alkaline Phosphatase: 167 U/L — ABNORMAL HIGH (ref 39–117)
Bilirubin, Direct: 0.1 mg/dL (ref 0.0–0.3)
Total Bilirubin: 0.4 mg/dL (ref 0.2–1.2)
Total Protein: 6.7 g/dL (ref 6.0–8.3)

## 2014-03-12 LAB — LIPID PANEL
CHOLESTEROL: 161 mg/dL (ref 0–200)
HDL: 53.4 mg/dL (ref >39.00)
LDL Cholesterol: 87 mg/dL (ref 0–99)
NonHDL: 107.6
Total CHOL/HDL Ratio: 3
Triglycerides: 101 mg/dL (ref 0.0–149.0)
VLDL: 20.2 mg/dL (ref 0.0–40.0)

## 2014-05-04 ENCOUNTER — Other Ambulatory Visit: Payer: Self-pay | Admitting: Cardiology

## 2014-06-30 ENCOUNTER — Other Ambulatory Visit: Payer: Self-pay | Admitting: Cardiology

## 2014-10-04 ENCOUNTER — Other Ambulatory Visit: Payer: Self-pay | Admitting: Cardiology

## 2014-10-05 NOTE — Telephone Encounter (Signed)
Patient Instructions     Increase atorvastatin to 40mg  daily.   Your physician recommends that you return for a FASTING lipid profile /liver profile in 2 months.   Your physician wants you to follow-up in: 6 months with Dr Aundra Dubin. (June 2016). You will receive a reminder letter in the mail two months in advance. If you don't receive a letter, please call our office to schedule the follow-up appointment.    Notes Recorded by Larey Dresser, MD on 03/12/2014 at 5:38 PM Lipids ok. Elevated alkaline phosphatase chronically, would forward to PCP.

## 2014-11-04 ENCOUNTER — Encounter: Payer: Self-pay | Admitting: Internal Medicine

## 2014-11-04 ENCOUNTER — Other Ambulatory Visit: Payer: Self-pay | Admitting: Cardiology

## 2014-11-19 ENCOUNTER — Ambulatory Visit (INDEPENDENT_AMBULATORY_CARE_PROVIDER_SITE_OTHER): Payer: BLUE CROSS/BLUE SHIELD | Admitting: Internal Medicine

## 2014-11-19 ENCOUNTER — Encounter: Payer: Self-pay | Admitting: Internal Medicine

## 2014-11-19 VITALS — BP 120/88 | HR 82 | Resp 15 | Ht 66.0 in | Wt 275.0 lb

## 2014-11-19 DIAGNOSIS — Z0000 Encounter for general adult medical examination without abnormal findings: Secondary | ICD-10-CM

## 2014-11-19 DIAGNOSIS — Z23 Encounter for immunization: Secondary | ICD-10-CM

## 2014-11-19 DIAGNOSIS — C8299 Follicular lymphoma, unspecified, extranodal and solid organ sites: Secondary | ICD-10-CM

## 2014-11-19 DIAGNOSIS — D329 Benign neoplasm of meninges, unspecified: Secondary | ICD-10-CM

## 2014-11-19 DIAGNOSIS — R5383 Other fatigue: Secondary | ICD-10-CM

## 2014-11-19 DIAGNOSIS — R9389 Abnormal findings on diagnostic imaging of other specified body structures: Secondary | ICD-10-CM

## 2014-11-19 DIAGNOSIS — H04123 Dry eye syndrome of bilateral lacrimal glands: Secondary | ICD-10-CM

## 2014-11-19 DIAGNOSIS — K117 Disturbances of salivary secretion: Secondary | ICD-10-CM

## 2014-11-19 NOTE — Progress Notes (Signed)
Subjective:    Patient ID: [name], female    DOB: Nov 11, 1955, 59 y.o.   MRN: [id number]  HPI She is here for a physical;acute issues include dry eyes and dry mouth present for approximately 2 years. This has been associated with dental caries. The dry mouth is especially problematic in the morning upon awakening.  For the last 6 months she has had profound fatigue. She questions whether this might be related to decreased lung capacity. She's been followed at [facility] for pulmonary nodules which are stable. 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  She is now to have CT scans of the lung every year. A CNS tumor suggesting meningioma also has been followed. She's been referred to Dr. Tommi Rumps there at [facility].   She has been compliant with her medications without adverse effects. She ate some red meat; she denies excess fried foods or salt. She's not exercising.  Colonoscopy was done in 2013; follow-up is recommended 10 years later. She has no active GI symptoms.  The fatigue has been present for 6 months. She denies any associated obstructive sleep apnea phenomena.  She does have intermittent dyspnea which is not necessarily exertional.  Additionally she describes dry skin  She has nocturia 1-2 times per night.  She does not have significant arthritic symptoms except for pain in the right hip. She also has pain at the clavicle at the site of a remote fracture.  She will have some numbness in the right lateral thigh when she is supine.  There's been no new change in family history.  Review of Systems  Chest pain, palpitations, tachycardia, paroxysmal nocturnal dyspnea, claudication or edema are absent. No unexplained weight loss, abdominal pain, significant dyspepsia, dysphagia, melena, rectal bleeding, or persistently small caliber stools. Dysuria, pyuria, hematuria, frequency,  or polyuria are  denied. Change in hair or nails denied. No bowel changes of constipation or diarrhea. No intolerance to heat or cold.      Objective:   Physical Exam  Pertinent or positive findings include:BMI 44.41. She does have some scattered caries. Skin over her hands is markedly dry. Abdomen is protuberant. She has some venous spiders of the feet.  General appearance :adequately nourished; in no distress.  Eyes: No conjunctival inflammation or scleral icterus is present.  Oral exam:  Lips and gums are healthy appearing.There is no oropharyngeal erythema or exudate noted.   Heart:  Normal rate and regular rhythm. S1 and S2 normal without gallop, murmur, click, rub or other extra sounds    Lungs:Chest clear to auscultation; no wheezes, rhonchi,rales ,or rubs present.No increased work of breathing.   Abdomen: bowel sounds normal, soft and non-tender without masses, organomegaly or hernias noted.  No guarding or rebound.   Vascular : all pulses equal ; no bruits present.  Skin:Warm & dry.  Intact without suspicious lesions or rashes ; no tenting or jaundice   Lymphatic: No lymphadenopathy is noted about the head, neck, axilla  Neuro: Strength, tone & DTRs normal.      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 As per Dr Roda Shutters  #5 fatigue present 6 months  #6 Sjogren's syndrome symptoms present 2 years   Plan: see Orders  & Recommendations. She will be referred to an Oncologist as well as Rheumatologist here in [place]

## 2014-11-19 NOTE — Patient Instructions (Signed)
The Oncology and Rheumatology referrals will be scheduled and you'll be notified of the time.Please call the Referral Co-Ordinator @ [phone] if you have not been notified of appointment time within 7-10 days.

## 2014-11-19 NOTE — Progress Notes (Signed)
Pre visit review using our clinic review tool, if applicable. No additional management support is needed unless otherwise documented below in the visit note. 

## 2014-11-20 NOTE — Assessment & Plan Note (Signed)
Oncology referral

## 2014-11-20 NOTE — Assessment & Plan Note (Signed)
As per Dr Tommi Rumps, [facility]

## 2014-11-26 ENCOUNTER — Telehealth: Payer: Self-pay | Admitting: Cardiology

## 2014-11-26 ENCOUNTER — Other Ambulatory Visit (INDEPENDENT_AMBULATORY_CARE_PROVIDER_SITE_OTHER): Payer: BLUE CROSS/BLUE SHIELD

## 2014-11-26 DIAGNOSIS — Z0189 Encounter for other specified special examinations: Secondary | ICD-10-CM | POA: Diagnosis not present

## 2014-11-26 DIAGNOSIS — Z0000 Encounter for general adult medical examination without abnormal findings: Secondary | ICD-10-CM

## 2014-11-26 LAB — LIPID PANEL
CHOLESTEROL: 157 mg/dL (ref 0–200)
HDL: 50.6 mg/dL (ref >39.00)
LDL CALC: 88 mg/dL (ref 0–99)
NonHDL: 106.58
TRIGLYCERIDES: 93 mg/dL (ref 0.0–149.0)
Total CHOL/HDL Ratio: 3
VLDL: 18.6 mg/dL (ref 0.0–40.0)

## 2014-11-26 LAB — CBC WITH DIFFERENTIAL/PLATELET
BASOS PCT: 0.4 % (ref 0.0–3.0)
Basophils Absolute: 0 10*3/uL (ref 0.0–0.1)
EOS PCT: 0 % (ref 0.0–5.0)
Eosinophils Absolute: 0 10*3/uL (ref 0.0–0.7)
HCT: 40.2 % (ref 36.0–46.0)
HEMOGLOBIN: 13.5 g/dL (ref 12.0–15.0)
LYMPHS ABS: 1.6 10*3/uL (ref 0.7–4.0)
Lymphocytes Relative: 27.7 % (ref 12.0–46.0)
MCHC: 33.5 g/dL (ref 30.0–36.0)
MCV: 91.3 fl (ref 78.0–100.0)
MONO ABS: 0.4 10*3/uL (ref 0.1–1.0)
MONOS PCT: 6.7 % (ref 3.0–12.0)
Neutro Abs: 3.8 10*3/uL (ref 1.4–7.7)
Neutrophils Relative %: 65.2 % (ref 43.0–77.0)
Platelets: 224 10*3/uL (ref 150.0–400.0)
RBC: 4.4 Mil/uL (ref 3.87–5.11)
RDW: 13.3 % (ref 11.5–15.5)
WBC: 5.8 10*3/uL (ref 4.0–10.5)

## 2014-11-26 LAB — BASIC METABOLIC PANEL
BUN: 16 mg/dL (ref 6–23)
CHLORIDE: 107 meq/L (ref 96–112)
CO2: 27 mEq/L (ref 19–32)
Calcium: 9.3 mg/dL (ref 8.4–10.5)
Creatinine, Ser: 0.96 mg/dL (ref 0.40–1.20)
GFR: 62.72 mL/min (ref >60.00)
GLUCOSE: 99 mg/dL (ref 70–99)
POTASSIUM: 4.6 meq/L (ref 3.5–5.1)
Sodium: 143 mEq/L (ref 135–145)

## 2014-11-26 LAB — HEPATIC FUNCTION PANEL
ALBUMIN: 3.9 g/dL (ref 3.5–5.2)
ALT: 32 U/L (ref 0–35)
AST: 26 U/L (ref 0–37)
Alkaline Phosphatase: 180 U/L — ABNORMAL HIGH (ref 39–117)
BILIRUBIN TOTAL: 0.6 mg/dL (ref 0.2–1.2)
Bilirubin, Direct: 0.1 mg/dL (ref 0.0–0.3)
TOTAL PROTEIN: 6.9 g/dL (ref 6.0–8.3)

## 2014-11-26 LAB — TSH: TSH: 3.67 u[IU]/mL (ref 0.35–4.50)

## 2014-11-26 LAB — SEDIMENTATION RATE: Sed Rate: 44 mm/hr — ABNORMAL HIGH (ref 0–22)

## 2014-11-26 NOTE — Telephone Encounter (Signed)
Pt states she is not having any cardiac symptoms.  Pt scheduled to see Dr Aundra Dubin in Feb 2017. Pt recently saw Dr Linna Darner, lab was ordered by Dr Linna Darner that pt was going to have today--lipid profile/liver profile/BMET/CBCd/TSH/Sed rate. Pt asking if Dr Aundra Dubin would want her to have any additional lab. Pt advised Dr Aundra Dubin would not order additional lab.

## 2014-11-26 NOTE — Telephone Encounter (Signed)
New Message    Pt calling wanting to know if she needs to be seen before Feb w/ Dr. Aundra Dubin. Please call back and advise.

## 2014-11-29 ENCOUNTER — Other Ambulatory Visit: Payer: Self-pay | Admitting: Internal Medicine

## 2014-11-29 DIAGNOSIS — R748 Abnormal levels of other serum enzymes: Secondary | ICD-10-CM

## 2014-12-05 ENCOUNTER — Other Ambulatory Visit: Payer: Self-pay | Admitting: Cardiology

## 2014-12-11 ENCOUNTER — Telehealth: Payer: Self-pay | Admitting: Hematology and Oncology

## 2014-12-11 NOTE — Telephone Encounter (Signed)
Pt aware of appt. On 12/17/14@1 :30

## 2014-12-17 ENCOUNTER — Ambulatory Visit (HOSPITAL_BASED_OUTPATIENT_CLINIC_OR_DEPARTMENT_OTHER): Payer: BLUE CROSS/BLUE SHIELD | Admitting: Hematology and Oncology

## 2014-12-17 ENCOUNTER — Encounter: Payer: Self-pay | Admitting: Hematology and Oncology

## 2014-12-17 VITALS — BP 142/66 | HR 70 | Temp 98.3°F | Resp 18 | Ht 66.0 in | Wt 276.3 lb

## 2014-12-17 DIAGNOSIS — Z23 Encounter for immunization: Secondary | ICD-10-CM

## 2014-12-17 DIAGNOSIS — Z8572 Personal history of non-Hodgkin lymphomas: Secondary | ICD-10-CM

## 2014-12-17 DIAGNOSIS — Z85828 Personal history of other malignant neoplasm of skin: Secondary | ICD-10-CM

## 2014-12-17 DIAGNOSIS — Z8589 Personal history of malignant neoplasm of other organs and systems: Secondary | ICD-10-CM | POA: Diagnosis not present

## 2014-12-17 DIAGNOSIS — Z801 Family history of malignant neoplasm of trachea, bronchus and lung: Secondary | ICD-10-CM | POA: Diagnosis not present

## 2014-12-17 DIAGNOSIS — Z8542 Personal history of malignant neoplasm of other parts of uterus: Secondary | ICD-10-CM

## 2014-12-17 DIAGNOSIS — C8299 Follicular lymphoma, unspecified, extranodal and solid organ sites: Secondary | ICD-10-CM

## 2014-12-17 MED ORDER — PNEUMOCOCCAL 13-VAL CONJ VACC IM SUSP
0.5000 mL | Freq: Once | INTRAMUSCULAR | Status: AC
  Administered 2014-12-17: 0.5 mL via INTRAMUSCULAR
  Filled 2014-12-17: qty 0.5

## 2014-12-18 DIAGNOSIS — Z85828 Personal history of other malignant neoplasm of skin: Secondary | ICD-10-CM | POA: Insufficient documentation

## 2014-12-18 NOTE — Progress Notes (Signed)
[facility] CONSULT NOTE  Patient Care Team: Hendricks Limes, MD as PCP - General  CHIEF COMPLAINTS/PURPOSE OF CONSULTATION:  Resected lymphoma from the right lower lobe, history of endometrial cancer and skin cancer  HISTORY OF PRESENTING ILLNESS:  [name] 59 y.o. female is here because of diagnosis of lymphoma. I review over 50 pages of outside records and collaborated the history with the patient.  #1 [id number] endometrial cancer The patient have remote history of endometrial cancer, stage I resected over 10 years ago. At the time of diagnosis, she had abnormal Pap smear leading to subsequent workup and complete hysterectomy. Pathology of that surgical specimen confirm FIGO grade 1 endometrial cancer without any other disease spread. She did not receive any adjuvant treatment.  #2 recurrent basal cell cancer The patient also have remote history of basal cell carcinoma resected in several places. She has not seen a dermatologist recently but did have excessive sun exposure as a child.  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 Incidentally, she was noted to have groundglass appearance in the right lower lobe and further imaging was recommended. Of note, the patient is a nonsmoker but has excessive exposure to cigarette smoking from her parents when she was younger. On 11/24/2011, she had repeat CT scan which showed persistent nodule in the right lower lobeas well as another nodule in the right upper lobe. Further follow-up was recommended On 03/29/2012, CT scan of the chest was repeated which demonstrated persistent stable groundglass nodule in both right lower lobe and similar nodule in the right upper lobe On 07/13/2013, she had CT angiogram done due to chest pain. The right upper & lower lobe nodules are stable.  On  08/04/2013, she underwent PET CT scan which showed Pulmonary nodule within the right lower lobe measures 1.6 x 1.8 cm. The SUV max associated with this nodule is equal to 4.85. She has no other evidence of distant disease. She was subsequently referred to [facility] for surgical resection. On 09/28/2013, she underwent right lower lobe wedge resection and lymph node sampling. Pathology report revealed granulomatosis and atypical lymphocytic infiltration and presence of monoclonal B cell population, confirming possible involvement of low-grade B-cell lymphoma. Since the surgery, she had regular surveillance imaging study. Her last CT scan at [facility] dated 11/02/2014 showed no evidence of recurrence and postoperative changes only.  She recovered well from surgery. She had mild intermittent discomfort at the postsurgical site but denies recent cough or chest pain  MEDICAL HISTORY:  Past Medical History  Diagnosis Date  . 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 CMV pneumonia ([place])     CMV pneumonia 1994  . Colitis 08/2004    ?Ischemic colitis in 2006.  . Obesity   . Clotting disorder ([place])     a. Anti-cardiolipin antibody syndrome (IgM anticardiolipin, B2-glycoprotein IgM). b. Has had superficial thr. phebitis 06/2011,  . Allergic rhinitis   . Colon polyps   . Diverticulosis     Has had diverticulitis x 2  . Hemorrhoids   . Palpitations     a. Holter (10/11): 9.6% of beats were atrial ectopy, average HR 78, short runs of up to 5-6 beats of probable atrial tachycardia.   . Other and unspecified hyperlipidemia 06/22/2007  . BENIGN POSITIONAL VERTIGO 03/03/2010  . HIATAL HERNIA 05/26/2009  . 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 Superficial thrombophlebitis   . Edema     a. LLE post ACL sprain 2010.  Marland Kitchen Reactive airway disease     a. ?RAD wheezes with respiratory infections.  . Normal cardiac stress test     a. Stress echo (10/11): EF 60-65%, no significant valvular  abnormalities. No wall motion abnormalities with exertion, suggesting no ischemia. b. normal nuclear stress test in 09/2013 done for pre-op   . Pulmonary nodules     a. Followed periodically by CT (Last in 03/2012).  . Thyroid nodule     a. Small bilat thyroid nodule by [place] 2013, periodically followed.  . Endometrial cancer ([place]) 01/10/2004    a. S/P Hysterectomy and BSO  . Skin cancer     basal cell lip and behind ear    SURGICAL HISTORY: Past Surgical History  Procedure Laterality Date  . Colonoscopy w/ polypectomy  01/26/2004    Dr.Perry  . Total abdominal hysterectomy w/ bilateral salpingoophorectomy  01/2004    For endometrial cancer ; Dr Aldean Ast  . Tonsillectomy    . Laparoscopy      for 3 ovarian cyst   . Cholecystectomy    . Fibroidectomy      SOCIAL HISTORY: Social History   Social History  . Marital Status: Married    Spouse Name: N/A  . Number of Children: N/A  . Years of Education: N/A   Occupational History  . 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 Smoking status: Passive Smoke Exposure - Never Smoker  . Smokeless tobacco: Never Used     Comment: passive exposure 2nd hand x 18 years  . Alcohol Use: No  . Drug Use: No  . Sexual Activity: Not on file   Other Topics Concern  . Not on file   Social History Narrative    FAMILY HISTORY: Family History  Problem Relation Age of Onset  . Heart attack Mother 26  . Stroke      Grandmother (? M or P)  . Heart failure      Grandmother (?M or P)  . Sudden death Paternal Grandfather   . Atrial fibrillation Mother   . Atrial fibrillation      Grandmother (? M or P)  . Asthma Father   . Diabetes Father   . Liver disease Father     Liver transplant  . Diverticulitis Father   . Deep vein thrombosis Mother   . Lung cancer Mother   . Asthma Sister   . Prostate cancer Maternal Grandfather   . Heart attack Maternal Grandmother     Questionable  . Heart failure Maternal  Grandmother     Questionable  . Deep vein thrombosis Maternal Grandmother   . Colon polyps Father   . Colon cancer Paternal Grandmother     Questionable  . Heart failure Paternal Grandfather     ALLERGIES:  is allergic to vioxx and sulfa antibiotics.  MEDICATIONS:  Current Outpatient Prescriptions  Medication Sig Dispense Refill  . aspirin 81 MG tablet Take 81 mg by mouth daily.      Marland Kitchen atorvastatin (LIPITOR) 40 MG tablet TAKE 1 TABLET BY MOUTH EVERY DAY 30 tablet 3  . Flaxseed, Linseed, (FLAX SEED OIL) 1000 MG CAPS Take 1,000 mg by mouth daily.      . Magnesium 250 MG TABS Take 1 tablet by mouth daily as needed.     Marland Kitchen  metoprolol tartrate (LOPRESSOR) 25 MG tablet Take 0.5 tablets (12.5 mg total) by mouth 2 (two) times daily. 90 tablet 3  . Multiple Vitamin (MULTIVITAMIN) tablet Take 1 tablet by mouth daily.      . vitamin C (ASCORBIC ACID) 500 MG tablet Take by mouth. As directed     No current facility-administered medications for this visit.    REVIEW OF SYSTEMS:   Constitutional: Denies fevers, chills or abnormal night sweats Eyes: Denies blurriness of vision, double vision or watery eyes Ears, nose, mouth, throat, and face: Denies mucositis or sore throat Respiratory: Denies cough, dyspnea or wheezes Cardiovascular: Denies palpitation, chest discomfort or lower extremity swelling Gastrointestinal:  Denies nausea, heartburn or change in bowel habits Skin: Denies abnormal skin rashes Lymphatics: Denies new lymphadenopathy or easy bruising Neurological:Denies numbness, tingling or new weaknesses Behavioral/Psych: Mood is stable, no new changes  All other systems were reviewed with the patient and are negative.  PHYSICAL EXAMINATION: ECOG PERFORMANCE STATUS: 1 - Symptomatic but completely ambulatory  Filed Vitals:   12/17/14 1422  BP: 142/66  Pulse: 70  Temp: 98.3 F (36.8 C)  Resp: 18   Filed Weights   12/17/14 1422  Weight: 276 lb 4.8 oz (125.329 kg)     GENERAL:alert, no distress and comfortable. She is morbidly obese SKIN: She has dry skin. No suspicious skin lesions are found. EYES: normal, conjunctiva are pink and non-injected, sclera clear OROPHARYNX:no exudate, no erythema and lips, buccal mucosa, and tongue normal  NECK: supple, thyroid normal size, non-tender, without nodularity LYMPH:  no palpable lymphadenopathy in the cervical, axillary or inguinal LUNGS: clear to auscultation and percussion with normal breathing effort HEART: regular rate & rhythm and no murmurs and no lower extremity edema ABDOMEN:abdomen soft, non-tender and normal bowel sounds Musculoskeletal:no cyanosis of digits and no clubbing  PSYCH: alert & oriented x 3 with fluent speech NEURO: no focal motor/sensory deficits  LABORATORY DATA:  I have reviewed the data as listed Lab Results  Component Value Date   WBC 5.8 11/26/2014   HGB 13.5 11/26/2014   HCT 40.2 11/26/2014   MCV 91.3 11/26/2014   PLT 224.0 11/26/2014    Recent Labs  03/12/14 0952 11/26/14 1214  NA  --  143  K  --  4.6  CL  --  107  CO2  --  27  GLUCOSE  --  99  BUN  --  16  CREATININE  --  0.96  CALCIUM  --  9.3  PROT 6.7 6.9  ALBUMIN 3.8 3.9  AST 25 26  ALT 27 32  ALKPHOS 167* 180*  BILITOT 0.4 0.6  BILIDIR 0.1 0.1    RADIOGRAPHIC STUDIES:I review some of her most recent CT imaging and PET scan I have personally reviewed the radiological images as listed and agreed with the findings in the report.   ASSESSMENT & PLAN:  History of non-Hodgkin's lymphoma I have a long discussion with the patient and I reviewed the current guidelines. There is no role for adjuvant radiation or chemotherapy for resected low-grade lymphoma. I also do not recommend routine surveillance imaging study long-term. This kind of lymphoma typically slow growing  I educated the patient signs and symptoms to watch out for disease recurrence. I will see her on a yearly basis with history,  physical examination and blood work only. I reinforced the importance of vaccination. Due to recent lung resection and diagnosis of lymphoma, I recommend pneumococcal vaccination and she agreed to proceed.  History of  endometrial cancer She is a long-term cancer survivor. There is no role for routine surveillance imaging. The patient have induced premature menopause. I recommend vitamin D supplement due to high risk of osteoporosis  History of basal cell cancer She had history of excessive sun exposure and basal cell carcinoma resected. I recommend she makes appointment to see her dermatologist at least on an annual basis and recommended aggressive skin protection    All questions were answered. The patient knows to call the clinic with any problems, questions or concerns. I spent 40 minutes counseling the patient face to face. The total time spent in the appointment was 60 minutes and more than 50% was on counseling.     [facility], [place], MD 12/18/2014 12:54 PM

## 2014-12-18 NOTE — Assessment & Plan Note (Signed)
I have a long discussion with the patient and I reviewed the current guidelines. There is no role for adjuvant radiation or chemotherapy for resected low-grade lymphoma. I also do not recommend routine surveillance imaging study long-term. This kind of lymphoma typically slow growing  I educated the patient signs and symptoms to watch out for disease recurrence. I will see her on a yearly basis with history, physical examination and blood work only. I reinforced the importance of vaccination. Due to recent lung resection and diagnosis of lymphoma, I recommend pneumococcal vaccination and she agreed to proceed.

## 2014-12-18 NOTE — Assessment & Plan Note (Signed)
She is a long-term cancer survivor. There is no role for routine surveillance imaging. The patient have induced premature menopause. I recommend vitamin D supplement due to high risk of osteoporosis

## 2014-12-18 NOTE — Assessment & Plan Note (Signed)
She had history of excessive sun exposure and basal cell carcinoma resected. I recommend she makes appointment to see her dermatologist at least on an annual basis and recommended aggressive skin protection

## 2014-12-29 ENCOUNTER — Telehealth: Payer: Self-pay | Admitting: Cardiology

## 2014-12-29 ENCOUNTER — Other Ambulatory Visit: Payer: Self-pay | Admitting: Cardiology

## 2014-12-29 MED ORDER — METOPROLOL TARTRATE 25 MG PO TABS: ORAL_TABLET | ORAL | Status: DC

## 2014-12-29 NOTE — Telephone Encounter (Signed)
New Message    Pt wants to talk to rn about an referral

## 2014-12-29 NOTE — Telephone Encounter (Signed)
[facility] for rheumatology.  Pricilla Holm (Shelba Flake) for PCP.

## 2014-12-29 NOTE — Telephone Encounter (Signed)
Pt.notified

## 2014-12-29 NOTE — Telephone Encounter (Signed)
F/u    Pt returning your call and stated she had her ringer off by mistake, but please call her back.

## 2014-12-29 NOTE — Telephone Encounter (Signed)
Attempted to contact pt by telephone, unable to leave a message, mailbox full.

## 2014-12-29 NOTE — Telephone Encounter (Signed)
Pt asking for Dr Claris Gladden recommendation for new primary care since Dr Linna Darner is retiring, she discussed this with Dr Aundra Dubin in the past but cannot remember his recommendation. Pt also asking for a recommendation from Dr Aundra Dubin for a  rheumatologist.   Pt advised I will forward to Dr Aundra Dubin for review.

## 2014-12-31 ENCOUNTER — Encounter: Payer: Self-pay | Admitting: Internal Medicine

## 2014-12-31 ENCOUNTER — Ambulatory Visit (INDEPENDENT_AMBULATORY_CARE_PROVIDER_SITE_OTHER): Payer: BLUE CROSS/BLUE SHIELD | Admitting: Internal Medicine

## 2014-12-31 VITALS — BP 110/80 | HR 65 | Temp 98.6°F | Resp 16 | Ht 66.0 in | Wt 276.4 lb

## 2014-12-31 DIAGNOSIS — H04123 Dry eye syndrome of bilateral lacrimal glands: Secondary | ICD-10-CM

## 2014-12-31 DIAGNOSIS — K117 Disturbances of salivary secretion: Secondary | ICD-10-CM

## 2014-12-31 DIAGNOSIS — R109 Unspecified abdominal pain: Secondary | ICD-10-CM

## 2014-12-31 DIAGNOSIS — R682 Dry mouth, unspecified: Secondary | ICD-10-CM

## 2014-12-31 MED ORDER — HYOSCYAMINE SULFATE 0.125 MG SL SUBL: 0.1250 mg | SUBLINGUAL_TABLET | Freq: Four times a day (QID) | SUBLINGUAL | Status: DC | PRN

## 2014-12-31 NOTE — Patient Instructions (Signed)
The ENT referral will be scheduled and you'll be notified of the time.Please call the Referral Co-Ordinator @ [phone] if you have not been notified of appointment time within 7-10 days. 

## 2014-12-31 NOTE — Progress Notes (Signed)
   Subjective:    Patient ID: [name], female    DOB: [birth year], 59 y.o.   MRN: [id number]  HPI The patient is here to assess status of active health conditions.  PMH, FH, & Social History reviewed & updated.No change in [place] as recorded.  She does eat some red meat. She has decreased fried food and salt in her diet. She's been compliant with her medications without adverse effects. The beta blocker was prescribed for irregular heartbeat. She is not exercising but plans to do so.  She does have swelling of the left ankle occasionally.  She describes lower abdominal cramping on 5-6 occasions in the last several months. It's in the lower abdomen and lasts several hours. She takes Advil with eventual relief. There is no associated bloating. She's had a total abdominal hysterectomy and bilateral salpingo-oophorectomy.  She describes decreased saliva resulting in dry mouth. She must push on the left parotid area to express saliva.  She also has dry eyes. Her ophthalmologic exam is pending in February.   She denies significant associated arthritis.  She also has nocturia 1-2 times per night.  Dr.Gorsuch,her  Oncologist has not recommended any treatment for the nodular lymphoma; annual monitor will continue.  Review of Systems  Chest pain, palpitations, tachycardia, exertional dyspnea, paroxysmal nocturnal dyspnea, or claudication  are absent. No unexplained weight loss,  significant dyspepsia, dysphagia, melena, rectal bleeding, or persistently small caliber stools. Dysuria, pyuria, hematuria, frequency, or polyuria are denied. Change in hair, skin, nails denied. No bowel changes of constipation or diarrhea. No intolerance to heat or cold.     Objective:   Physical Exam  Pertinent or positive findings include:The oral cavity and conjunctivae do not appear dry clinically. Her skin is dry. She has minor crepitus of the knees.  General appearance :adequately nourished; in no  distress.BMI 44.63.  Eyes: No conjunctival inflammation or scleral icterus is present.  Oral exam:  Lips and gums are healthy appearing.There is no oropharyngeal erythema or exudate noted. Dental hygiene is good.  Heart:  Normal rate and regular rhythm. S1 and S2 normal without gallop, murmur, click, rub or other extra sounds    Lungs:Chest clear to auscultation; no wheezes, rhonchi,rales ,or rubs present.No increased work of breathing.   Abdomen: Protuberant; bowel sounds normal, soft and non-tender without masses, organomegaly or hernias noted.  No guarding or rebound. No flank tenderness to percussion.  Vascular : all pulses equal ; no bruits present.  Skin:Warm & dry.  Intact without suspicious lesions or rashes ; no tenting or jaundice   Lymphatic: No lymphadenopathy is noted about the head, neck, axilla.   Neuro: Strength, tone & DTRs normal.     Assessment & Plan:    #1 abdominal cramping; generic Levsin SL recommended as needed  #2 xerostomia  #3 xero-ophthalmia  Ophthalmologic follow-up recommended as scheduled. ENT will be consulted

## 2014-12-31 NOTE — Progress Notes (Signed)
Pre visit review using our clinic review tool, if applicable. No additional management support is needed unless otherwise documented below in the visit note. 

## 2015-01-06 ENCOUNTER — Encounter: Payer: Self-pay | Admitting: Internal Medicine

## 2015-01-23 IMAGING — CR DG CHEST 2V
2 series · 2 of 2 positions shown · non-contrast
Comparison: 06/26/2012

CLINICAL DATA: Right lower rib pain for 1 week, status post right
lower lobectomy 4 months ago

EXAM:
CHEST  2 VIEW

[view not recorded (1 of 2)]
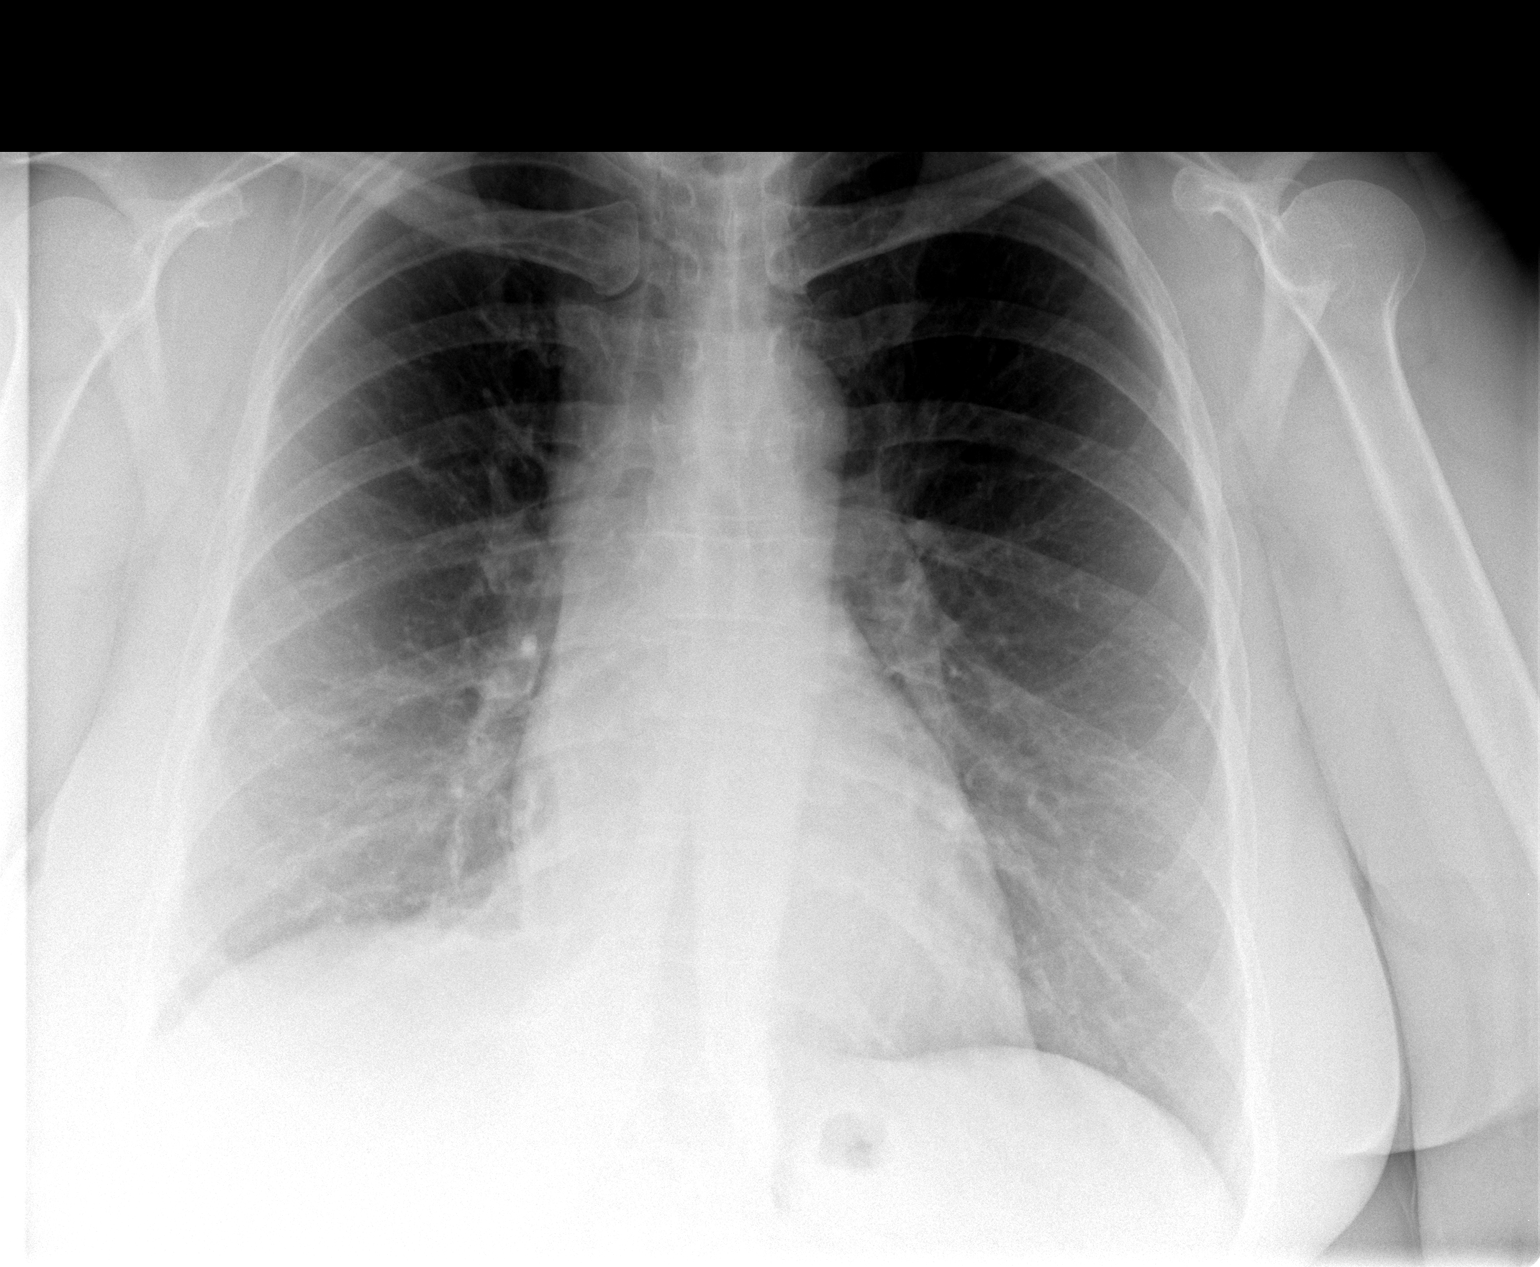

[view not recorded (2 of 2)]
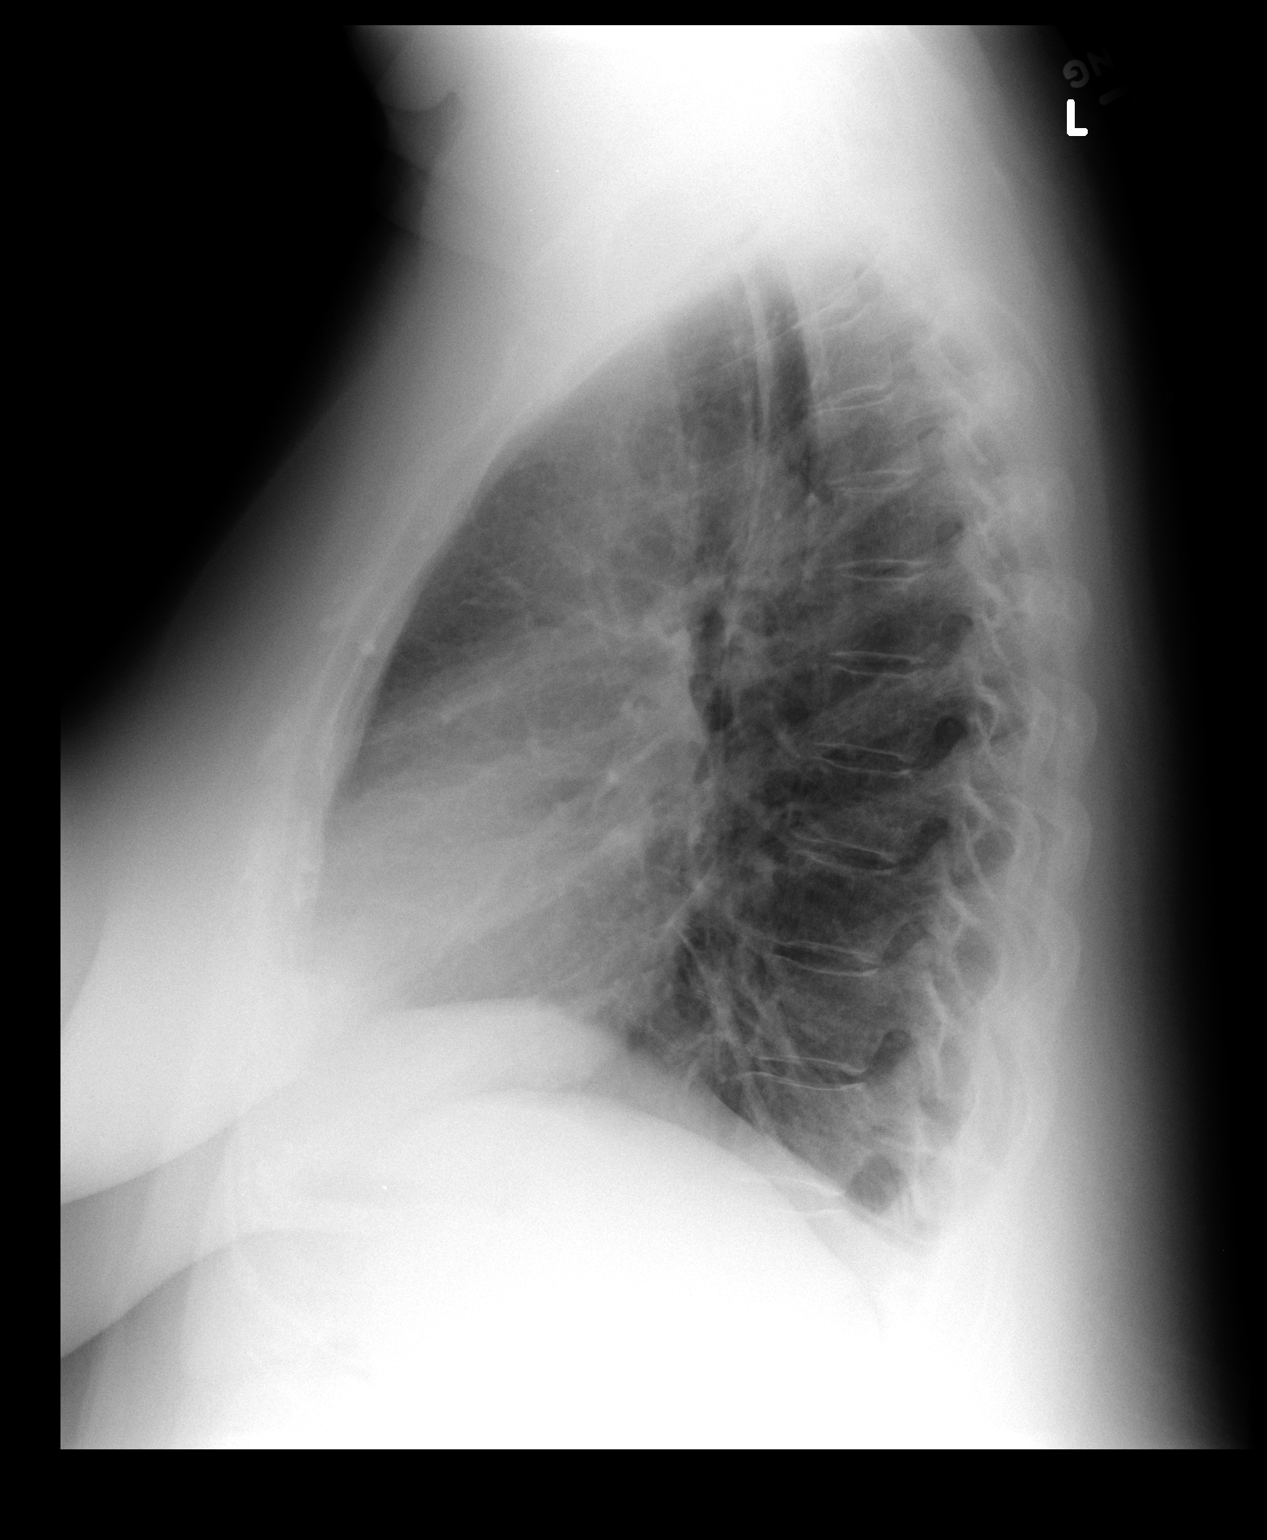

[2 of 2 positions shown; findings below may reference images not displayed]

FINDINGS: Cardiomediastinal silhouette is stable. There is mild elevation of
the right hemidiaphragm. No acute infiltrate or pulmonary edema.
Bony thorax is unremarkable. Probable right basilar scarring.
IMPRESSION: No active disease. Elevation of the right hemidiaphragm. Right
basilar scarring.

## 2015-02-25 ENCOUNTER — Encounter: Payer: Self-pay | Admitting: Cardiology

## 2015-03-05 ENCOUNTER — Encounter: Payer: Self-pay | Admitting: Cardiology

## 2015-03-05 ENCOUNTER — Ambulatory Visit (INDEPENDENT_AMBULATORY_CARE_PROVIDER_SITE_OTHER): Payer: BLUE CROSS/BLUE SHIELD | Admitting: Cardiology

## 2015-03-05 VITALS — BP 124/70 | HR 68 | Ht 66.0 in | Wt 279.0 lb

## 2015-03-05 DIAGNOSIS — M810 Age-related osteoporosis without current pathological fracture: Secondary | ICD-10-CM | POA: Diagnosis not present

## 2015-03-05 DIAGNOSIS — E785 Hyperlipidemia, unspecified: Secondary | ICD-10-CM

## 2015-03-05 DIAGNOSIS — R002 Palpitations: Secondary | ICD-10-CM | POA: Diagnosis not present

## 2015-03-05 DIAGNOSIS — I2510 Atherosclerotic heart disease of native coronary artery without angina pectoris: Secondary | ICD-10-CM | POA: Diagnosis not present

## 2015-03-05 NOTE — Patient Instructions (Addendum)
Medication Instructions:  Your physician recommends that you continue on your current medications as directed. Please refer to the Current Medication list given to you today.   Labwork: Your physician recommends that you return for lab work in: 6 months - cholesterol, complete metabolic panel You will need to FAST for this appointment - nothing to eat or drink after midnight the night before except water.   Testing/Procedures: Dr. Aundra Dubin has ordered a Bone Density Test for you   Follow-Up: Your physician wants you to follow-up in: 1 year with Dr. Aundra Dubin.  You will receive a reminder letter in the mail two months in advance. If you don't receive a letter, please call our office to schedule the follow-up appointment.  If you need a refill on your cardiac medications before your next appointment, please call your pharmacy.

## 2015-03-07 NOTE — Progress Notes (Signed)
Patient ID: [name], female   DOB: 11-22-1955, 60 y.o.   MRN: [id number] PCP: Dr. Linna Darner  60 yo with history of antiphospholipid antibody syndrome, prior endometrial cancer, and palpitations with brief runs of atrial tachycardia as well as frequent PACs on past monitoring presents for cardiology followup.   3-week monitor in 12/14 (due to palpitations) showed short runs of SVT, no atrial fibrillation.  Echo showed EF 55-60% and did not have significant abnormality.  ETT-Cardiolite in 8/15 showed no ischemia or infarction.    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  This is being followed by serial CTs and is quiescent currently.    She is doing well symptomatically at this time.  No significant palpitations.  No chest pain or exertional dyspnea.  Still working long hours and getting little exercise.    Labs (7/11): HCT 40.6, K 4.3, creatinine 0.6, TSH normal, free T4 normal  Labs (10/11): K 4.8, creatinine 1.1, LFTs normal, LDL 158, HDL 58  Labs (1/12): LDL 150, Lp(a) 104 Labs (11/13): LDL 93, HDL 51 Labs (6/14): K 3.8, creatinine 0.91 Labs (7/14): LDL 90, HDL 50 Labs (12/14): K 4, creatinine 0.9, BNP 34, TSH normal Labs (10/15): K 4.3, creatinine 1.0 Labs (11/15): LDL 101, HDL 48, TSH normal, LFTs normal Labs (11/16): K 4.6, creatinine 0.96, LDL 88, HDL 51  Past Medical History:  1. Encephalitis with coma age 85  2. CMV pneumonia 1993  3. Colitis 2006, ? ischemic  4. Clotting disorder followed by Dr Marin Olp (sounds like antiphospholipid antibody syndrome given positive anticardiolipin antibody and positve beta-2 glycoprotetin tests). ASA 81 mg once daily.  Has had superficial thrombophlebitis L leg (6/13 ultrasound).   5. Edema LLE post ACL sprain 01/2008  6. Allergic rhinitis  7. Hyperlipidemia  8. ?RAD wheezes with RTIs  9. Colonic polyps, hx of  10. Diverticulosis, colon  11. Endometrial cancer s/p hysterectomy  12. Obesity  13. CAD:  Stress echo (10/11) with EF 60-65%, no significant valvular abnormalities. No wall motion abnormalities with exertion, suggesting no ischemia.  Echo (12/14) with EF 55-60%, no significant abnormalities.  ETT-Cardiolite (8/15) with EF 71%, no ischemia or infarction. CTs recently have shown calcified coronaries.  14. Palpitations: Holter (10/11): 9.6% of beats were atrial ectopy, average HR 78, short runs of up to 5-6 beats of probable atrial tachycaria.  3 week event monitor (12/14) showed short runs of SVT rarely, no atrial fibrillation.  15. Meningioma 16. Lymphoma: Nodule found in RLL, hypermetabolic by PET. She had right lobectomy with pathology showing low grade non-Hodgkins lymphoma.   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 Husband had liver transplant.   ROS: All systems reviewed and negative except as per HPI.   Current Outpatient Prescriptions  Medication Sig Dispense Refill  . aspirin 81 MG tablet Take 81 mg by mouth daily.      Marland Kitchen atorvastatin (LIPITOR) 40 MG tablet TAKE 1 TABLET BY MOUTH EVERY DAY 30 tablet 3  . cholecalciferol (VITAMIN D) 1000 UNITS tablet Take 1,000 Units by mouth daily. Take two by mouth once daily    . Flaxseed, Linseed, (FLAX SEED OIL) 1000 MG CAPS Take 1,000 mg by mouth daily.      . Magnesium  250 MG TABS Take 1 tablet by mouth daily as needed.     . metoprolol tartrate (LOPRESSOR) 25 MG tablet TAKE 1/2 TABLETS BY MOUTH 2 TIMES DAILY 90 tablet 1  . Multiple Vitamin (MULTIVITAMIN) tablet Take 1 tablet by mouth daily.      . vitamin C (ASCORBIC ACID) 500 MG tablet Take by mouth. As directed     No current facility-administered medications for this visit.    BP 124/70 mmHg  Pulse 68  Ht 5\' 6"  (1.676 m)  Wt 279 lb (126.554 kg)  BMI 45.05 kg/m2  SpO2  97% General: NAD, overweight Neck: No JVD, no thyromegaly or thyroid nodule.  Lungs: Slight crackles right base.  CV: Nondisplaced PMI.  Heart regular S1/S2, no S3/S4, no murmur.  No edema.  No carotid bruit.  Normal pedal pulses.  Abdomen: Soft, nontender, no hepatosplenomegaly, no distention.   Neurologic: Alert and oriented x 3.  Psych: Normal affect. Extremities: No clubbing or cyanosis.   Assessment/Plan: 1. Palpitations: Resolved on metoprolol.  Event monitor showed short runs of SVT, no atrial fibrillation.  Echo showed normal EF and no significant valvular abnormalities.  2. Hyperlipidemia: Give extensive coronary calcification on CT, I would like to see LDL < 70. Most recent lipids in 11/16 close to goal. 3. Obesity: I strongly encouraged diet and exercise for weight loss.  She has a treadmill at her house, I suggested that she walk on the treadmill for 30 minutes a day.  4. CAD: Coronary calcification on CT.  Continue statin and aspirin.   Loralie Champagne 03/07/2015

## 2015-03-16 ENCOUNTER — Other Ambulatory Visit: Payer: Self-pay | Admitting: Cardiology

## 2015-03-16 ENCOUNTER — Other Ambulatory Visit: Payer: Self-pay | Admitting: Hematology and Oncology

## 2015-03-16 DIAGNOSIS — Z1231 Encounter for screening mammogram for malignant neoplasm of breast: Secondary | ICD-10-CM

## 2015-03-24 ENCOUNTER — Other Ambulatory Visit: Payer: BLUE CROSS/BLUE SHIELD

## 2015-04-06 ENCOUNTER — Ambulatory Visit: Payer: BLUE CROSS/BLUE SHIELD

## 2015-04-06 ENCOUNTER — Other Ambulatory Visit: Payer: BLUE CROSS/BLUE SHIELD

## 2015-04-10 ENCOUNTER — Other Ambulatory Visit: Payer: Self-pay | Admitting: Cardiology

## 2015-04-29 ENCOUNTER — Inpatient Hospital Stay: Admission: RE | Admit: 2015-04-29 | Payer: BLUE CROSS/BLUE SHIELD | Source: Ambulatory Visit

## 2015-04-29 ENCOUNTER — Ambulatory Visit: Payer: BLUE CROSS/BLUE SHIELD

## 2015-04-29 ENCOUNTER — Telehealth: Payer: Self-pay

## 2015-04-29 NOTE — Telephone Encounter (Signed)
1) mammography is standard for screening 2) Tomography may not be covered by insurance but if it is, go for it 3) No difference in my mind between D2 or D3

## 2015-04-29 NOTE — Telephone Encounter (Signed)
Pt has questions 1) pt has not had mammogram in over a year. Is there something other than mammography that would be better in light of her lymphoma?  How often should she have her mammography? There is new guidelines out 2) is thermography an option? 3) vitamin D3 or vitamin D2 which is better for therapeutic results?

## 2015-04-30 NOTE — Telephone Encounter (Signed)
S/w pt about Dr Alvy Bimler response. Pt was appreciative of information.

## 2015-07-14 ENCOUNTER — Telehealth: Payer: Self-pay | Admitting: Cardiology

## 2015-07-14 NOTE — Telephone Encounter (Signed)
Pt states she has had LE edema daily for a couple of weeks. Pt states they are swollen when she goes to bed at night and are normal when she gets up in the morning. Pt denies shortness of breath, does not weigh daily but does not feel she has extra fluid in any other area of her body. Pt advised to avoid salt/use compression stockings/keep feet and legs elevated during the day as much as possible.  Pt states she is scheduled to have tooth extracted and implant placed by Dr Benson Norway in his office. Pt states that Dr Benson Norway will administer fentanyl and versed in his office, pt asking if Dr Aundra Dubin feels this is okay to do in the office. Pt states Dr Benson Norway is not requesting clearance for dental procedure.

## 2015-07-14 NOTE — Telephone Encounter (Signed)
OK to do tooth extraction in office. Needs to raise legs, wear compression stockings, restrict salt for swelling.

## 2015-07-14 NOTE — Telephone Encounter (Signed)
Pt advised I will forward to Dr McLean for review.  

## 2015-07-14 NOTE — Telephone Encounter (Signed)
Pt calling to ask if she labs and when-also both feet swelling x  a few weeks -also having oral surgery 7-13 with sedation-is this a concern?  pls call [phone]

## 2015-07-15 NOTE — Telephone Encounter (Signed)
LMTCB

## 2015-07-15 NOTE — Telephone Encounter (Signed)
F/u Message ° °Pt returning RN call. Please call back to discuss  °

## 2015-07-15 NOTE — Telephone Encounter (Signed)
Discussed Dr Claris Gladden recommendations with pt, pt verbalized understanding.

## 2015-07-20 ENCOUNTER — Ambulatory Visit
Admission: RE | Admit: 2015-07-20 | Discharge: 2015-07-20 | Disposition: A | Discharge status: Home / Self Care | Payer: BLUE CROSS/BLUE SHIELD | Source: Ambulatory Visit | Attending: Hematology and Oncology | Admitting: Hematology and Oncology

## 2015-07-20 ENCOUNTER — Ambulatory Visit
Admission: RE | Admit: 2015-07-20 | Discharge: 2015-07-20 | Disposition: A | Discharge status: Home / Self Care | Payer: BLUE CROSS/BLUE SHIELD | Source: Ambulatory Visit | Attending: Cardiology | Admitting: Cardiology

## 2015-07-20 DIAGNOSIS — Z1231 Encounter for screening mammogram for malignant neoplasm of breast: Secondary | ICD-10-CM

## 2015-07-20 DIAGNOSIS — M810 Age-related osteoporosis without current pathological fracture: Secondary | ICD-10-CM

## 2015-07-23 ENCOUNTER — Other Ambulatory Visit: Payer: Self-pay | Admitting: *Deleted

## 2015-07-23 DIAGNOSIS — M8580 Other specified disorders of bone density and structure, unspecified site: Secondary | ICD-10-CM

## 2015-09-02 ENCOUNTER — Other Ambulatory Visit: Payer: BLUE CROSS/BLUE SHIELD | Admitting: *Deleted

## 2015-09-02 DIAGNOSIS — E785 Hyperlipidemia, unspecified: Secondary | ICD-10-CM

## 2015-09-02 LAB — COMPREHENSIVE METABOLIC PANEL
ALBUMIN: 4 g/dL (ref 3.6–5.1)
ALT: 48 U/L — AB (ref 6–29)
AST: 42 U/L — ABNORMAL HIGH (ref 10–35)
Alkaline Phosphatase: 168 U/L — ABNORMAL HIGH (ref 33–130)
BILIRUBIN TOTAL: 0.6 mg/dL (ref 0.2–1.2)
BUN: 14 mg/dL (ref 7–25)
CALCIUM: 9.2 mg/dL (ref 8.6–10.4)
CO2: 27 mmol/L (ref 20–31)
Chloride: 102 mmol/L (ref 98–110)
Creat: 0.94 mg/dL (ref 0.50–0.99)
Glucose, Bld: 98 mg/dL (ref 65–99)
POTASSIUM: 4.2 mmol/L (ref 3.5–5.3)
Sodium: 141 mmol/L (ref 135–146)
TOTAL PROTEIN: 6.5 g/dL (ref 6.1–8.1)

## 2015-09-02 LAB — LIPID PANEL
CHOLESTEROL: 170 mg/dL (ref 125–200)
HDL: 66 mg/dL (ref >=46)
LDL Cholesterol: 81 mg/dL (ref <130)
TRIGLYCERIDES: 114 mg/dL (ref <150)
Total CHOL/HDL Ratio: 2.6 Ratio (ref <=5.0)
VLDL: 23 mg/dL (ref <30)

## 2015-09-06 ENCOUNTER — Other Ambulatory Visit: Payer: Self-pay | Admitting: *Deleted

## 2015-09-06 DIAGNOSIS — R748 Abnormal levels of other serum enzymes: Secondary | ICD-10-CM

## 2015-09-14 ENCOUNTER — Other Ambulatory Visit: Payer: BLUE CROSS/BLUE SHIELD | Admitting: *Deleted

## 2015-09-14 DIAGNOSIS — R748 Abnormal levels of other serum enzymes: Secondary | ICD-10-CM

## 2015-09-14 LAB — HEPATIC FUNCTION PANEL
ALBUMIN: 3.8 g/dL (ref 3.6–5.1)
ALK PHOS: 172 U/L — AB (ref 33–130)
ALT: 49 U/L — ABNORMAL HIGH (ref 6–29)
AST: 43 U/L — ABNORMAL HIGH (ref 10–35)
Bilirubin, Direct: 0.1 mg/dL (ref <=0.2)
Indirect Bilirubin: 0.6 mg/dL (ref 0.2–1.2)
TOTAL PROTEIN: 6.5 g/dL (ref 6.1–8.1)
Total Bilirubin: 0.7 mg/dL (ref 0.2–1.2)

## 2015-09-20 ENCOUNTER — Telehealth: Payer: Self-pay | Admitting: *Deleted

## 2015-09-20 ENCOUNTER — Other Ambulatory Visit: Payer: Self-pay | Admitting: Cardiology

## 2015-09-20 DIAGNOSIS — R748 Abnormal levels of other serum enzymes: Secondary | ICD-10-CM

## 2015-09-20 MED ORDER — METOPROLOL TARTRATE 25 MG PO TABS: ORAL_TABLET | ORAL | 2 refills | Status: DC

## 2015-09-20 NOTE — Telephone Encounter (Signed)
Notes Recorded by Larey Dresser, MD on 09/20/2015 at 12:15 AM EDT Hold atorvastatin for 1 week and repeat CMET. If LFTs come back to normal, will start her on Crestor 10 mg daily instead of Lipitor with LFTs in 1 month, lipids/LFTs in 2 months. Marland Kitchen

## 2015-09-29 ENCOUNTER — Other Ambulatory Visit: Payer: BLUE CROSS/BLUE SHIELD | Admitting: *Deleted

## 2015-09-29 DIAGNOSIS — R748 Abnormal levels of other serum enzymes: Secondary | ICD-10-CM

## 2015-09-29 LAB — COMPREHENSIVE METABOLIC PANEL
ALK PHOS: 153 U/L — AB (ref 33–130)
ALT: 45 U/L — AB (ref 6–29)
AST: 43 U/L — AB (ref 10–35)
Albumin: 3.8 g/dL (ref 3.6–5.1)
BILIRUBIN TOTAL: 0.5 mg/dL (ref 0.2–1.2)
BUN: 15 mg/dL (ref 7–25)
CO2: 27 mmol/L (ref 20–31)
Calcium: 9 mg/dL (ref 8.6–10.4)
Chloride: 105 mmol/L (ref 98–110)
Creat: 0.92 mg/dL (ref 0.50–0.99)
GLUCOSE: 94 mg/dL (ref 65–99)
Potassium: 4.5 mmol/L (ref 3.5–5.3)
Sodium: 142 mmol/L (ref 135–146)
Total Protein: 6.5 g/dL (ref 6.1–8.1)

## 2015-10-04 ENCOUNTER — Other Ambulatory Visit: Payer: Self-pay | Admitting: *Deleted

## 2015-10-04 DIAGNOSIS — R945 Abnormal results of liver function studies: Principal | ICD-10-CM

## 2015-10-04 DIAGNOSIS — R7989 Other specified abnormal findings of blood chemistry: Secondary | ICD-10-CM

## 2015-11-18 ENCOUNTER — Ambulatory Visit (INDEPENDENT_AMBULATORY_CARE_PROVIDER_SITE_OTHER): Payer: BLUE CROSS/BLUE SHIELD | Admitting: Internal Medicine

## 2015-11-18 ENCOUNTER — Encounter: Payer: Self-pay | Admitting: Internal Medicine

## 2015-11-18 ENCOUNTER — Other Ambulatory Visit (INDEPENDENT_AMBULATORY_CARE_PROVIDER_SITE_OTHER): Payer: BLUE CROSS/BLUE SHIELD

## 2015-11-18 VITALS — BP 118/78 | HR 72 | Ht 65.25 in | Wt 271.5 lb

## 2015-11-18 DIAGNOSIS — R1084 Generalized abdominal pain: Secondary | ICD-10-CM

## 2015-11-18 DIAGNOSIS — R945 Abnormal results of liver function studies: Secondary | ICD-10-CM

## 2015-11-18 DIAGNOSIS — R7989 Other specified abnormal findings of blood chemistry: Secondary | ICD-10-CM

## 2015-11-18 DIAGNOSIS — K5730 Diverticulosis of large intestine without perforation or abscess without bleeding: Secondary | ICD-10-CM

## 2015-11-18 DIAGNOSIS — K5901 Slow transit constipation: Secondary | ICD-10-CM

## 2015-11-18 LAB — BASIC METABOLIC PANEL
BUN: 15 mg/dL (ref 6–23)
CO2: 30 mEq/L (ref 19–32)
Calcium: 9.6 mg/dL (ref 8.4–10.5)
Chloride: 104 mEq/L (ref 96–112)
Creatinine, Ser: 1.05 mg/dL (ref 0.40–1.20)
GFR: 56.38 mL/min — AB (ref >60.00)
Glucose, Bld: 97 mg/dL (ref 70–99)
POTASSIUM: 4.7 meq/L (ref 3.5–5.1)
SODIUM: 140 meq/L (ref 135–145)

## 2015-11-18 LAB — HEPATIC FUNCTION PANEL
ALT: 22 U/L (ref 0–35)
AST: 23 U/L (ref 0–37)
Albumin: 4.1 g/dL (ref 3.5–5.2)
Alkaline Phosphatase: 125 U/L — ABNORMAL HIGH (ref 39–117)
BILIRUBIN TOTAL: 0.4 mg/dL (ref 0.2–1.2)
Bilirubin, Direct: 0.1 mg/dL (ref 0.0–0.3)
Total Protein: 7.3 g/dL (ref 6.0–8.3)

## 2015-11-18 LAB — IBC PANEL
IRON: 62 ug/dL (ref 42–145)
Saturation Ratios: 19.9 % — ABNORMAL LOW (ref 20.0–50.0)
TRANSFERRIN: 223 mg/dL (ref 212.0–360.0)

## 2015-11-18 LAB — PROTIME-INR
INR: 1 ratio (ref 0.8–1.0)
Prothrombin Time: 10.6 s (ref 9.6–13.1)

## 2015-11-18 LAB — IRON: IRON: 62 ug/dL (ref 42–145)

## 2015-11-18 LAB — FERRITIN: Ferritin: 56.1 ng/mL (ref 10.0–291.0)

## 2015-11-18 NOTE — Progress Notes (Signed)
HISTORY OF PRESENT ILLNESS:  [name] is a 60 y.o. female with morbid obesity, hyperlipidemia, early stage endometrial cancer status post hysterectomy and bilateral salpingo-oophorectomy, diverticulosis with a history of diverticulitis, history of ischemic colitis, and pulmonary lymphoma status post partial lobectomy. She sent today by her cardiologist Dr. Aundra Dubin regarding abnormal liver function tests. Patient also has chief complaint of new onset lower abdominal pain. Patient has been on Lipitor for some time. LFTs have been normal until at least November 2016. The next set of Liver tests after that time were August 2017. Mild elevation of hepatic transaminases. This persisted through September. Her Lipitor has been stopped. Liver tests not repeated. Patient denies a personal history of liver disease. She has had transfusions remotely. She does not use alcohol. Father required liver transplant secondary to hepatitis C. Next. Patient reports intermittent problems with lower abdominal discomfort over this year. She notices this once every few weeks. Also has had more stubborn bowel habits. We'll use MiraLAX on demand. No bleeding or weight loss.  REVIEW OF SYSTEMS:  All non-GI ROS negative except for swelling of the feet  Past Medical History:  Diagnosis Date  . Allergic rhinitis   . BENIGN POSITIONAL VERTIGO 03/03/2010  . Clotting disorder ([place])    a. Anti-cardiolipin antibody syndrome (IgM anticardiolipin, B2-glycoprotein IgM). b. Has had superficial thr. phebitis 06/2011,  . 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 Colitis 08/2004   ?Ischemic colitis in 2006.  . Colon polyps   . Diverticulosis    Has had diverticulitis x 2  . Edema    a. LLE post ACL sprain 2010.  . Encephalitis age 86   with coma  . Endometrial cancer ([place]) 01/10/2004   a. S/P Hysterectomy and BSO  . Hemorrhoids   . HIATAL HERNIA 05/26/2009  . Normal cardiac stress test    a. Stress echo (10/11): EF 60-65%, no  significant valvular abnormalities. No wall motion abnormalities with exertion, suggesting no ischemia. b. normal nuclear stress test in 09/2013 done for pre-op   . Obesity   . Other and unspecified hyperlipidemia 06/22/2007  . Palpitations    a. Holter (10/11): 9.6% of beats were atrial ectopy, average HR 78, short runs of up to 5-6 beats of probable atrial tachycardia.   . Pulmonary nodules    a. Followed periodically by CT (Last in 03/2012).  . Reactive airway disease    a. ?RAD wheezes with respiratory infections.  . 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 Superficial thrombophlebitis   . Thyroid nodule    a. Small bilat thyroid nodule by [place] 2013, periodically followed.  Marland Kitchen Unspecified disease of the salivary glands 06/22/2007   Clogged    Past Surgical History:  Procedure Laterality Date  . CHOLECYSTECTOMY    . COLONOSCOPY W/ POLYPECTOMY  01/26/2004   Dr.Annabelle Rexroad  . fibroidectomy    . LAPAROSCOPY     for 3 ovarian cyst   . LUNG LOBECTOMY Right   . TONSILLECTOMY    . TOTAL ABDOMINAL HYSTERECTOMY W/ BILATERAL SALPINGOOPHORECTOMY  01/2004   For endometrial cancer ; Dr Aldean Ast    Social History [name]  reports that she is a non-smoker but has been exposed to tobacco smoke. She has never used smokeless tobacco. She reports that she does not drink alcohol or use drugs.  family history includes Asthma in her father and sister; Atrial fibrillation in her mother; Colon cancer in her  paternal grandmother; Colon polyps in her father; Deep vein thrombosis in her maternal grandmother and mother; Diabetes in her father; Diverticulitis in her father; Heart attack in her maternal grandmother; Heart attack (age of onset: 98) in her mother; Heart failure in her maternal grandmother and paternal grandfather; Liver disease in her father; Lung cancer in her mother; Prostate cancer in her maternal grandfather; Sudden death in her paternal grandfather.  Allergies  Allergen Reactions   . Vioxx [Rofecoxib] Swelling    Severe swelling of ankles Ankle edema  . Sulfa Antibiotics Other (See Comments)    Patient is not sure but thinks she may have an allergy to sulfa       PHYSICAL EXAMINATION: Vital signs: BP 118/78 (BP Location: Left Arm, Patient Position: Sitting, Cuff Size: Large)   Pulse 72   Ht 5' 5.25" (1.657 m) Comment: height measured without shoes  Wt 271 lb 8 oz (123.2 kg)   BMI 44.83 kg/m   Constitutional:Doesn't, markedly obese, generally well-appearing otherwise, no acute distress Psychiatric: alert and oriented x3, cooperative Eyes: extraocular movements intact, anicteric, conjunctiva pink Mouth: oral pharynx moist, no lesions Neck: supple no lymphadenopathy Cardiovascular: heart regular rate and rhythm, no murmur Lungs: clear to auscultation bilaterally Abdomen: soft , obese,, nontender, nondistended, no obvious ascites, no peritoneal signs, normal bowel sounds, no organomegaly Rectal: Omitted Extremities: no clubbing or cyanosis. 1+ nonpitting lower extremity edema bilaterally Skin: no lesions on visible extremities Neuro: No focal deficits. Cranial nerves intact. No asterixis.  ASSESSMENT:  1. Mild elevation of hepatic transaminases. Suspect fatty liver. Could be related to Lipitor. Rule out other causes. Family history of liver disease requiring transplantation, presumably secondary to hepatitis C though unconfirmed 2. R abdominal pain. Suspect related to constipation 3. Severe pandiverticulosis with sigmoid stenosis on colonoscopy 2013. May be contributing to symptoms in the lower gut 4. History of pulmonary lymphoma being monitored 5. Morbid obesity 6. Constipation  PLAN:  1. Multiple viral nonviral studies looking for etiologies of elevated hepatic transaminases. Repeat LFTs as well 2. Liver ultrasound to rule out fatty liver other lesion 3. MiraLAX daily to achieve 1-2 bowel movements 4. Contrast-enhanced CT scan of the abdomen and  pelvis to evaluate abdominal pain 5. Follow-up to be determined   A copy of this consultation note has been sent to Dr. Aundra Dubin

## 2015-11-18 NOTE — Patient Instructions (Signed)
Your physician has requested that you go to the basement for lab work before leaving today  You have been scheduled for an abdominal ultrasound at [facility] (1st floor of hospital) on 12/06/2015 at [id number]. Make certain not to have anything to eat or drink 6 hours prior to your appointment. Should you need to reschedule your appointment, please contact radiology at [phone]. This test typically takes about 30 minutes to perform.  You have been scheduled for a CT scan of the abdomen and pelvis.    You are scheduled for a CT at 1:30pm.  WARNING: IF YOU ARE ALLERGIC TO IODINE/X-RAY DYE, PLEASE NOTIFY RADIOLOGY IMMEDIATELY AT [phone]! YOU WILL BE GIVEN A 13 HOUR PREMEDICATION PREP.  ________________________________________________________________________    Dennis Bast may use Miralax for constipation.

## 2015-11-19 LAB — MITOCHONDRIAL ANTIBODIES

## 2015-11-19 LAB — ANTI-NUCLEAR AB-TITER (ANA TITER)

## 2015-11-19 LAB — ANA: Anti Nuclear Antibody(ANA): POSITIVE — AB

## 2015-11-19 LAB — HEPATITIS B SURFACE ANTIGEN: Hepatitis B Surface Ag: NEGATIVE

## 2015-11-19 LAB — HEPATITIS B SURFACE ANTIBODY,QUALITATIVE: Hep B S Ab: NEGATIVE

## 2015-11-19 LAB — HEPATITIS C ANTIBODY: HCV AB: NEGATIVE

## 2015-11-22 LAB — ALPHA-1-ANTITRYPSIN: A1 ANTITRYPSIN SER: 175 mg/dL (ref 83–199)

## 2015-11-22 LAB — CERULOPLASMIN: Ceruloplasmin: 37 mg/dL (ref 18–53)

## 2015-11-22 LAB — TISSUE TRANSGLUTAMINASE, IGG: Tissue Transglut Ab: 3 U/mL (ref <6)

## 2015-11-23 ENCOUNTER — Other Ambulatory Visit: Payer: Self-pay

## 2015-11-23 DIAGNOSIS — R7989 Other specified abnormal findings of blood chemistry: Secondary | ICD-10-CM

## 2015-11-23 DIAGNOSIS — R945 Abnormal results of liver function studies: Principal | ICD-10-CM

## 2015-11-24 LAB — ANTI-SMOOTH MUSCLE ANTIBODY, IGG

## 2015-12-06 ENCOUNTER — Ambulatory Visit (HOSPITAL_COMMUNITY): Admission: RE | Admit: 2015-12-06 | Payer: BLUE CROSS/BLUE SHIELD | Source: Ambulatory Visit

## 2015-12-06 ENCOUNTER — Ambulatory Visit (HOSPITAL_COMMUNITY)
Admission: RE | Admit: 2015-12-06 | Discharge: 2015-12-06 | Disposition: A | Discharge status: Home / Self Care | Payer: BLUE CROSS/BLUE SHIELD | Source: Ambulatory Visit | Attending: Internal Medicine | Admitting: Internal Medicine

## 2015-12-06 ENCOUNTER — Inpatient Hospital Stay (HOSPITAL_COMMUNITY): Admission: RE | Admit: 2015-12-06 | Payer: BLUE CROSS/BLUE SHIELD | Source: Ambulatory Visit

## 2015-12-06 DIAGNOSIS — R7989 Other specified abnormal findings of blood chemistry: Secondary | ICD-10-CM

## 2015-12-06 DIAGNOSIS — K5730 Diverticulosis of large intestine without perforation or abscess without bleeding: Secondary | ICD-10-CM | POA: Diagnosis not present

## 2015-12-06 DIAGNOSIS — R1084 Generalized abdominal pain: Secondary | ICD-10-CM

## 2015-12-06 DIAGNOSIS — R945 Abnormal results of liver function studies: Secondary | ICD-10-CM

## 2015-12-06 DIAGNOSIS — J90 Pleural effusion, not elsewhere classified: Secondary | ICD-10-CM | POA: Insufficient documentation

## 2015-12-06 DIAGNOSIS — R1011 Right upper quadrant pain: Secondary | ICD-10-CM | POA: Diagnosis not present

## 2015-12-06 DIAGNOSIS — K429 Umbilical hernia without obstruction or gangrene: Secondary | ICD-10-CM | POA: Insufficient documentation

## 2015-12-06 DIAGNOSIS — Z9049 Acquired absence of other specified parts of digestive tract: Secondary | ICD-10-CM | POA: Insufficient documentation

## 2015-12-06 MED ORDER — IOPAMIDOL (ISOVUE-300) INJECTION 61%
100.0000 mL | Freq: Once | INTRAVENOUS | Status: AC | PRN
  Administered 2015-12-06: 100 mL via INTRAVENOUS

## 2015-12-07 DIAGNOSIS — D329 Benign neoplasm of meninges, unspecified: Secondary | ICD-10-CM | POA: Diagnosis not present

## 2015-12-16 ENCOUNTER — Encounter (HOSPITAL_COMMUNITY): Payer: Self-pay | Admitting: *Deleted

## 2015-12-16 ENCOUNTER — Emergency Department (HOSPITAL_COMMUNITY): Payer: BLUE CROSS/BLUE SHIELD

## 2015-12-16 ENCOUNTER — Emergency Department (HOSPITAL_COMMUNITY)
Admission: EM | Admit: 2015-12-16 | Discharge: 2015-12-16 | Disposition: A | Discharge status: Home / Self Care | Payer: BLUE CROSS/BLUE SHIELD | Attending: Emergency Medicine | Admitting: Emergency Medicine

## 2015-12-16 DIAGNOSIS — Z8542 Personal history of malignant neoplasm of other parts of uterus: Secondary | ICD-10-CM | POA: Diagnosis not present

## 2015-12-16 DIAGNOSIS — R091 Pleurisy: Secondary | ICD-10-CM | POA: Diagnosis not present

## 2015-12-16 DIAGNOSIS — Z7722 Contact with and (suspected) exposure to environmental tobacco smoke (acute) (chronic): Secondary | ICD-10-CM | POA: Insufficient documentation

## 2015-12-16 DIAGNOSIS — J181 Lobar pneumonia, unspecified organism: Secondary | ICD-10-CM | POA: Diagnosis not present

## 2015-12-16 DIAGNOSIS — I2510 Atherosclerotic heart disease of native coronary artery without angina pectoris: Secondary | ICD-10-CM | POA: Diagnosis not present

## 2015-12-16 DIAGNOSIS — Z7982 Long term (current) use of aspirin: Secondary | ICD-10-CM | POA: Insufficient documentation

## 2015-12-16 DIAGNOSIS — Z85828 Personal history of other malignant neoplasm of skin: Secondary | ICD-10-CM | POA: Diagnosis not present

## 2015-12-16 DIAGNOSIS — J189 Pneumonia, unspecified organism: Secondary | ICD-10-CM | POA: Diagnosis not present

## 2015-12-16 DIAGNOSIS — R0781 Pleurodynia: Secondary | ICD-10-CM | POA: Diagnosis not present

## 2015-12-16 DIAGNOSIS — R079 Chest pain, unspecified: Secondary | ICD-10-CM | POA: Diagnosis not present

## 2015-12-16 LAB — CBC WITH DIFFERENTIAL/PLATELET
Basophils Absolute: 0 10*3/uL (ref 0.0–0.1)
Basophils Relative: 0 %
EOS ABS: 0 10*3/uL (ref 0.0–0.7)
Eosinophils Relative: 0 %
HEMATOCRIT: 40.7 % (ref 36.0–46.0)
HEMOGLOBIN: 13.7 g/dL (ref 12.0–15.0)
LYMPHS ABS: 1.5 10*3/uL (ref 0.7–4.0)
LYMPHS PCT: 16 %
MCH: 30.8 pg (ref 26.0–34.0)
MCHC: 33.7 g/dL (ref 30.0–36.0)
MCV: 91.5 fL (ref 78.0–100.0)
Monocytes Absolute: 0.6 10*3/uL (ref 0.1–1.0)
Monocytes Relative: 7 %
NEUTROS ABS: 7.1 10*3/uL (ref 1.7–7.7)
NEUTROS PCT: 77 %
Platelets: 224 10*3/uL (ref 150–400)
RBC: 4.45 MIL/uL (ref 3.87–5.11)
RDW: 13.3 % (ref 11.5–15.5)
WBC: 9.2 10*3/uL (ref 4.0–10.5)

## 2015-12-16 LAB — URINALYSIS, ROUTINE W REFLEX MICROSCOPIC
BILIRUBIN URINE: NEGATIVE
GLUCOSE, UA: NEGATIVE mg/dL
Hgb urine dipstick: NEGATIVE
KETONES UR: NEGATIVE mg/dL
Leukocytes, UA: NEGATIVE
NITRITE: NEGATIVE
PH: 7 (ref 5.0–8.0)
Protein, ur: NEGATIVE mg/dL
Specific Gravity, Urine: 1.009 (ref 1.005–1.030)

## 2015-12-16 LAB — D-DIMER, QUANTITATIVE: D-Dimer, Quant: 2.11 ug/mL-FEU — ABNORMAL HIGH (ref 0.00–0.50)

## 2015-12-16 LAB — COMPREHENSIVE METABOLIC PANEL
ALK PHOS: 106 U/L (ref 38–126)
ALT: 21 U/L (ref 14–54)
AST: 25 U/L (ref 15–41)
Albumin: 3.6 g/dL (ref 3.5–5.0)
Anion gap: 11 (ref 5–15)
BILIRUBIN TOTAL: 0.7 mg/dL (ref 0.3–1.2)
BUN: 12 mg/dL (ref 6–20)
CALCIUM: 9 mg/dL (ref 8.9–10.3)
CO2: 22 mmol/L (ref 22–32)
CREATININE: 0.92 mg/dL (ref 0.44–1.00)
Chloride: 105 mmol/L (ref 101–111)
GFR calc non Af Amer: >60 mL/min (ref >60)
GLUCOSE: 112 mg/dL — AB (ref 65–99)
Potassium: 3.8 mmol/L (ref 3.5–5.1)
SODIUM: 138 mmol/L (ref 135–145)
Total Protein: 6.4 g/dL — ABNORMAL LOW (ref 6.5–8.1)

## 2015-12-16 MED ORDER — SODIUM CHLORIDE 0.9 % IV BOLUS (SEPSIS)
1000.0000 mL | Freq: Once | INTRAVENOUS | Status: AC
  Administered 2015-12-16: 1000 mL via INTRAVENOUS

## 2015-12-16 MED ORDER — AZITHROMYCIN 250 MG PO TABS: 250.0000 mg | ORAL_TABLET | Freq: Every day | ORAL | 0 refills | Status: DC

## 2015-12-16 MED ORDER — IOPAMIDOL (ISOVUE-370) INJECTION 76%
INTRAVENOUS | Status: AC
  Administered 2015-12-16: 100 mL
  Filled 2015-12-16: qty 100

## 2015-12-16 MED ORDER — HYDROCODONE-ACETAMINOPHEN 5-325 MG PO TABS: 1.0000 | ORAL_TABLET | Freq: Four times a day (QID) | ORAL | 0 refills | Status: DC | PRN

## 2015-12-16 NOTE — ED Notes (Signed)
Tatyana PA at bedside with pt.

## 2015-12-16 NOTE — ED Notes (Signed)
Patient transported to CT 

## 2015-12-16 NOTE — ED Notes (Signed)
Pt ambulated to and from restroom with this RN with no difficulty. Pt requesting to speak with Tatyana PA about the CT before her IV is started, called Tatyana PA.

## 2015-12-16 NOTE — Discharge Instructions (Signed)
Your CT scan showing atelectasis vs pneumonia on the right lower lung which could be causing your pain. Take zithromax as prescribed until all gone for possible pneumonia. Take ibuprofen for pain. Take norco at bed time for severe pain. Follow up with your doctor as needed. Return if worsening symptoms.

## 2015-12-16 NOTE — ED Notes (Signed)
Tatyana PA at bedside   

## 2015-12-16 NOTE — ED Provider Notes (Signed)
[facility] DEPT Provider Note   CSN: [id number] Arrival date & time: 12/16/15  0459     History   Chief Complaint Chief Complaint  Patient presents with  . Rib Cage Pain    HPI [name] is a 60 y.o. female.  HPI [name] is a 61 y.o. female with history of recent long lymphoma, history of endometrial cancer, history of clotting disorder, pneumonia, right lower lung resection 1 year ago, presents to ED with complaint of right sided pain. Pt states pain initially began a week ago. States pain is in the right lower ribs, worse at night time when laying down flat, and worse with deep breathing. States pain worsened with every day. Reports taking tylenol and motrin with some relief. Denies cough or URI symptoms. Denies fever, chills, malaise. No SOB. No hx of the same. Denies abdominal pain. States last night pain was most severe and radiated up the chest, which made her worried so she came to ED for evaluation.   Past Medical History:  Diagnosis Date  . Allergic rhinitis   . BENIGN POSITIONAL VERTIGO 03/03/2010  . Clotting disorder ([place])    a. Anti-cardiolipin antibody syndrome (IgM anticardiolipin, B2-glycoprotein IgM). b. Has had superficial thr. phebitis 06/2011,  . 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 Colitis 08/2004   ?Ischemic colitis in 2006.  . Colon polyps   . Diverticulosis    Has had diverticulitis x 2  . Edema    a. LLE post ACL sprain 2010.  . Encephalitis age 63   with coma  . Endometrial cancer ([place]) 01/10/2004   a. S/P Hysterectomy and BSO  . Hemorrhoids   . HIATAL HERNIA 05/26/2009  . Normal cardiac stress test    a. Stress echo (10/11): EF 60-65%, no significant valvular abnormalities. No wall motion abnormalities with exertion, suggesting no ischemia. b. normal nuclear stress test in 09/2013 done for pre-op   . Obesity   . Other and unspecified hyperlipidemia 06/22/2007  . Palpitations    a. Holter (10/11): 9.6% of beats were atrial  ectopy, average HR 78, short runs of up to 5-6 beats of probable atrial tachycardia.   . Pulmonary nodules    a. Followed periodically by CT (Last in 03/2012).  . Reactive airway disease    a. ?RAD wheezes with respiratory infections.  . 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 Superficial thrombophlebitis   . Thyroid nodule    a. Small bilat thyroid nodule by [place] 2013, periodically followed.  Marland Kitchen Unspecified disease of the salivary glands 06/22/2007   Clogged    Patient Active Problem List   Diagnosis Date Noted  . History of basal cell cancer 12/18/2014  . Elevated alkaline phosphatase level 11/29/2014  . 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 Hyperlipidemia 12/14/2013  . CAD (coronary artery disease) 12/14/2013  . History of endometrial cancer   . Obesity   . Clotting disorder ([place])   . Pulmonary nodules   . Reactive airway disease   . Normal cardiac stress test   . Meningioma ([place]) 09/27/2013  . Severe obesity (BMI >= 40) ([place]) 07/16/2013  . Exertional dyspnea 12/10/2012  . Abdominal pain 06/26/2012  . Positive D dimer 10/07/2011  . Morbid obesity ([place]) 09/08/2011  . History of non-Hodgkin's lymphoma 08/04/2011  . Fatigue 07/31/2011  . BENIGN POSITIONAL VERTIGO 03/03/2010  . THYROID FUNCTION TEST, ABNORMAL 03/03/2010  . CHEST  PAIN 10/07/2009  . Tear film insufficiency 07/22/2009  . DRY MOUTH 07/22/2009  . DRY SKIN 07/22/2009  . Palpitations 07/22/2009  . HIATAL HERNIA 05/26/2009  . DIVERTICULITIS OF COLON 04/19/2009  . COLONIC POLYPS, HX OF 04/19/2009  . OTHER AND UNSPECIFIED COAGULATION DEFECTS 03/08/2009  . UNSPECIFIED MENOPAUSAL&POSTMENOPAUSAL DISORDER 09/16/2008  . Other and unspecified hyperlipidemia 06/22/2007  . UNSPECIFIED DISEASE OF THE SALIVARY GLANDS 06/22/2007  . ALLERGIC RHINITIS DUE TO OTHER ALLERGEN 02/23/2007    Past Surgical History:  Procedure Laterality Date  . CHOLECYSTECTOMY    . COLONOSCOPY W/  POLYPECTOMY  01/26/2004   Dr.Perry  . fibroidectomy    . LAPAROSCOPY     for 3 ovarian cyst   . LUNG LOBECTOMY Right   . TONSILLECTOMY    . TOTAL ABDOMINAL HYSTERECTOMY W/ BILATERAL SALPINGOOPHORECTOMY  01/2004   For endometrial cancer ; Dr Aldean Ast    OB History    No data available       Home Medications    Prior to Admission medications   Medication Sig Start Date End Date Taking? Authorizing Provider  aspirin 81 MG tablet Take 81 mg by mouth daily.     Yes Historical Provider, MD  cholecalciferol (VITAMIN D) 1000 UNITS tablet Take 1,000 Units by mouth daily. Take two by mouth once daily   Yes Historical Provider, MD  Flaxseed, Linseed, (FLAX SEED OIL) 1000 MG CAPS Take 1,000 mg by mouth daily.     Yes Historical Provider, MD  Magnesium 250 MG TABS Take 1 tablet by mouth daily as needed (cramping).    Yes Historical Provider, MD  metoprolol tartrate (LOPRESSOR) 25 MG tablet TAKE 1/2 TABLETS BY MOUTH 2 TIMES DAILY 09/20/15  Yes Larey Dresser, MD  Multiple Vitamin (MULTIVITAMIN) tablet Take 1 tablet by mouth daily.     Yes Historical Provider, MD  vitamin C (ASCORBIC ACID) 500 MG tablet Take 500 mg by mouth daily. As directed   Yes Historical Provider, MD    Family History Family History  Problem Relation Age of Onset  . Asthma Father   . Diabetes Father   . Liver disease Father     Liver transplant  . Diverticulitis Father   . Colon polyps Father   . Heart attack Mother 50  . Atrial fibrillation Mother   . Deep vein thrombosis Mother   . Lung cancer Mother   . Stroke      Grandmother (? M or P)  . Heart failure      Grandmother (?M or P)  . Sudden death Paternal Grandfather   . Heart failure Paternal Grandfather   . Atrial fibrillation      Grandmother (? M or P)  . Asthma Sister   . Prostate cancer Maternal Grandfather   . Heart attack Maternal Grandmother     Questionable  . Heart failure Maternal Grandmother     Questionable  . Deep vein thrombosis  Maternal Grandmother   . Colon cancer Paternal Grandmother     Questionable    Social History Social History  Substance Use Topics  . Smoking status: Passive Smoke Exposure - Never Smoker  . Smokeless tobacco: Never Used     Comment: passive exposure 2nd hand x 18 years  . Alcohol use No     Allergies   Vioxx [rofecoxib] and Sulfa antibiotics   Review of Systems Review of Systems  Constitutional: Negative for chills and fever.  Respiratory: Negative for cough, chest tightness and shortness of breath.  Cardiovascular: Positive for chest pain. Negative for palpitations and leg swelling.  Gastrointestinal: Negative for abdominal pain, diarrhea, nausea and vomiting.  Genitourinary: Negative for dysuria, flank pain and pelvic pain.  Musculoskeletal: Negative for arthralgias, myalgias, neck pain and neck stiffness.  Skin: Negative for rash.  Neurological: Negative for dizziness, weakness and headaches.  All other systems reviewed and are negative.    Physical Exam Updated Vital Signs BP 149/73   Pulse 83   Temp 98.8 F (37.1 C)   Resp 20   SpO2 95%   Physical Exam  Constitutional: She is oriented to person, place, and time. She appears well-developed and well-nourished. No distress.  HENT:  Head: Normocephalic.  Eyes: Conjunctivae are normal.  Neck: Neck supple.  Cardiovascular: Normal rate, regular rhythm and normal heart sounds.   Pulmonary/Chest: Effort normal and breath sounds normal. No respiratory distress. She has no wheezes. She has no rales. She exhibits tenderness.  Tender to palpation over posterior lower ribs and lower ribs in midaxillary line  Abdominal: Soft. Bowel sounds are normal. She exhibits no distension. There is no tenderness. There is no rebound and no guarding.  Musculoskeletal: She exhibits no edema.  Neurological: She is alert and oriented to person, place, and time.  Skin: Skin is warm and dry.  Psychiatric: She has a normal mood and  affect. Her behavior is normal.  Nursing note and vitals reviewed.    ED Treatments / Results  Labs (all labs ordered are listed, but only abnormal results are displayed) Labs Reviewed  COMPREHENSIVE METABOLIC PANEL - Abnormal; Notable for the following:       Result Value   Glucose, Bld 112 (*)    Total Protein 6.4 (*)    All other components within normal limits  URINALYSIS, ROUTINE W REFLEX MICROSCOPIC - Abnormal; Notable for the following:    Color, Urine STRAW (*)    All other components within normal limits  D-DIMER, QUANTITATIVE (NOT AT [facility]) - Abnormal; Notable for the following:    D-Dimer, Quant 2.11 (*)    All other components within normal limits  CBC WITH DIFFERENTIAL/PLATELET    EKG  EKG Interpretation  Date/Time:  Thursday December 16 2015 05:14:00 EST Ventricular Rate:  83 PR Interval:  150 QRS Duration: 78 QT Interval:  362 QTC Calculation: 425 R Axis:   20 Text Interpretation:  Normal sinus rhythm Right atrial enlargement Borderline ECG No significant change since last tracing Confirmed by Glynn Octave [phone]) on 12/16/2015 6:47:12 AM Also confirmed by Glynn Octave [phone]), editor [place]  CCT, [place] ([id number])  on 12/16/2015 7:08:38 AM       Radiology Dg Ribs Unilateral W/chest Right  Result Date: 12/16/2015 CLINICAL DATA:  Right-sided chest pain over the last week. No known injury. Previous surgery. EXAM: RIGHT RIBS AND CHEST - 3+ VIEW COMPARISON:  12/23/2015 FINDINGS: There is abnormal density in the right lower lung presumed to represent scarring from previous resection at the medial right base. Cannot rule out an element of active atelectasis or pneumonia. No regional rib abnormality is seen. IMPRESSION: Abnormal density at the right lung base presumed secondary to previous pulmonary resection in the medial right base. Cannot rule out an element of active atelectasis or pneumonia. No rib abnormality seen. Electronically Signed   By:  Nelson Chimes M.D.   On: 12/16/2015 07:31    Procedures Procedures (including critical care time)  Medications Ordered in ED Medications - No data to display   Initial Impression /  Assessment and Plan / ED Course  I have reviewed the triage vital signs and the nursing notes.  Pertinent labs & imaging results that were available during my care of the patient were reviewed by me and considered in my medical decision making (see chart for details).  Clinical Course    Patient with history of lung lymphoma, right lower lobe lung resection 2 years ago, here with pleuritic right-sided chest pain. She is in no acute distress. Vital signs unremarkable. We'll get labs, chest x-ray, d-dimer.  Patient d-dimer is 2.11. Will get CT angiogram for further evaluation of patient's pain and to rule out PE. Patient's lab work is otherwise negative. Chest x-ray showing atelectasis versus scarring versus pneumonia.  Patient's CT scan is negative for PE. It does show right lower lobe atelectasis versus infiltrate. I will start her on a Z-Pak for possible pneumonia. She denies any fever or chills or cough, however she does continue to have worsening right-sided pain that is worse with breathing. Will have her follow with primary care doctor. I suspect she could have pleurisy or postsurgical pain. Return precautions discussed.   Vitals:   12/16/15 0930 12/16/15 1000 12/16/15 1100 12/16/15 1122  BP: 147/82 140/70 153/79 153/79  Pulse: 90 87 88 84  Resp: 21 17 17 16   Temp:    98.7 F (37.1 C)  TempSrc:    Oral  SpO2: 96% 97% 98% 100%     Final Clinical Impressions(s) / ED Diagnoses   Final diagnoses:  Pleurisy  Community acquired pneumonia of right lower lobe of lung ([place])    New Prescriptions Discharge Medication List as of 12/16/2015 11:24 AM    START taking these medications   Details  azithromycin (ZITHROMAX) 250 MG tablet Take 1 tablet (250 mg total) by mouth daily. Take first 2 tablets  together, then 1 every day until finished., Starting Thu 12/16/2015, Print    HYDROcodone-acetaminophen (NORCO) 5-325 MG tablet Take 1 tablet by mouth every 6 (six) hours as needed for moderate pain or severe pain., Starting Thu 12/16/2015, Print         [employer], PA-C 12/16/15 1604    Varney Biles, MD 12/16/15 1652

## 2015-12-16 NOTE — ED Triage Notes (Addendum)
Pt c/o R lateral abdominal/ribcage pain for over a week. Pt reports pain worsens with inspiration and movement

## 2015-12-17 ENCOUNTER — Ambulatory Visit: Payer: BLUE CROSS/BLUE SHIELD | Admitting: Hematology and Oncology

## 2015-12-17 ENCOUNTER — Other Ambulatory Visit: Payer: BLUE CROSS/BLUE SHIELD

## 2016-01-18 ENCOUNTER — Ambulatory Visit: Payer: BLUE CROSS/BLUE SHIELD | Admitting: Internal Medicine

## 2016-01-31 ENCOUNTER — Other Ambulatory Visit: Payer: BLUE CROSS/BLUE SHIELD

## 2016-01-31 ENCOUNTER — Ambulatory Visit: Payer: Self-pay | Admitting: Hematology and Oncology

## 2016-01-31 ENCOUNTER — Telehealth: Payer: Self-pay | Admitting: Hematology and Oncology

## 2016-01-31 NOTE — Telephone Encounter (Signed)
Pt. called to cancel appointments for 1/22. Will call back to reschedule at a later time.

## 2016-02-07 ENCOUNTER — Ambulatory Visit: Payer: BLUE CROSS/BLUE SHIELD | Admitting: Internal Medicine

## 2016-02-25 DIAGNOSIS — Z85828 Personal history of other malignant neoplasm of skin: Secondary | ICD-10-CM | POA: Diagnosis not present

## 2016-02-25 DIAGNOSIS — L821 Other seborrheic keratosis: Secondary | ICD-10-CM | POA: Diagnosis not present

## 2016-02-25 DIAGNOSIS — L820 Inflamed seborrheic keratosis: Secondary | ICD-10-CM | POA: Diagnosis not present

## 2016-03-20 ENCOUNTER — Ambulatory Visit: Payer: BLUE CROSS/BLUE SHIELD | Admitting: Internal Medicine

## 2016-03-20 ENCOUNTER — Telehealth: Payer: Self-pay

## 2016-03-20 NOTE — Telephone Encounter (Signed)
Rescheduled with patient due to weather to 04/28/2016 at 3:00pm

## 2016-04-28 ENCOUNTER — Encounter: Payer: Self-pay | Admitting: Internal Medicine

## 2016-04-28 ENCOUNTER — Other Ambulatory Visit (INDEPENDENT_AMBULATORY_CARE_PROVIDER_SITE_OTHER): Payer: BLUE CROSS/BLUE SHIELD

## 2016-04-28 ENCOUNTER — Ambulatory Visit (INDEPENDENT_AMBULATORY_CARE_PROVIDER_SITE_OTHER): Payer: BLUE CROSS/BLUE SHIELD | Admitting: Internal Medicine

## 2016-04-28 VITALS — BP 110/76 | HR 80 | Ht 65.25 in | Wt 264.0 lb

## 2016-04-28 DIAGNOSIS — R7989 Other specified abnormal findings of blood chemistry: Secondary | ICD-10-CM

## 2016-04-28 DIAGNOSIS — K5901 Slow transit constipation: Secondary | ICD-10-CM | POA: Diagnosis not present

## 2016-04-28 DIAGNOSIS — K5730 Diverticulosis of large intestine without perforation or abscess without bleeding: Secondary | ICD-10-CM | POA: Diagnosis not present

## 2016-04-28 DIAGNOSIS — R1084 Generalized abdominal pain: Secondary | ICD-10-CM

## 2016-04-28 DIAGNOSIS — E784 Other hyperlipidemia: Secondary | ICD-10-CM | POA: Diagnosis not present

## 2016-04-28 DIAGNOSIS — E7849 Other hyperlipidemia: Secondary | ICD-10-CM

## 2016-04-28 DIAGNOSIS — R945 Abnormal results of liver function studies: Principal | ICD-10-CM

## 2016-04-28 LAB — LIPID PANEL
CHOL/HDL RATIO: 4
Cholesterol: 228 mg/dL — ABNORMAL HIGH (ref 0–200)
HDL: 55.8 mg/dL (ref >39.00)
LDL CALC: 138 mg/dL — AB (ref 0–99)
NONHDL: 172.29
Triglycerides: 169 mg/dL — ABNORMAL HIGH (ref 0.0–149.0)
VLDL: 33.8 mg/dL (ref 0.0–40.0)

## 2016-04-28 LAB — HEPATIC FUNCTION PANEL
ALK PHOS: 98 U/L (ref 39–117)
ALT: 19 U/L (ref 0–35)
AST: 23 U/L (ref 0–37)
Albumin: 4.1 g/dL (ref 3.5–5.2)
BILIRUBIN DIRECT: 0 mg/dL (ref 0.0–0.3)
TOTAL PROTEIN: 7.3 g/dL (ref 6.0–8.3)
Total Bilirubin: 0.4 mg/dL (ref 0.2–1.2)

## 2016-04-28 NOTE — Patient Instructions (Signed)
Your physician has requested that you go to the basement for the following lab work before leaving today:  Hepatic, lipid

## 2016-05-02 ENCOUNTER — Encounter: Payer: Self-pay | Admitting: Internal Medicine

## 2016-05-02 NOTE — Progress Notes (Signed)
HISTORY OF PRESENT ILLNESS:  [name] is a 60 y.o. female with multiple medical problems as listed below presents today for follow-up regarding abdominal pain and elevated liver tests. She was last evaluated 11/18/2015 regarding mild elevation of hepatic transaminases possibly related to Lipitor (had dose increased) or fatty liver. Also right-sided abdominal pain likely secondary to constipation. Known history of severe pandiverticulosis with sigmoid stenosis, last colonoscopy August 2013. Lipitor was held. Multiple viral nonviral studies were obtained and returned normal or negative except for elevated ANA in centromere pattern at 1-640. Follow-up liver tests in December had normalized. She did undergo abdominal ultrasound and CT of the abdomen and pelvis. No acute abnormalities. Umbilical hernia noted. MiraLAX recommended for constipation. She presents today for follow-up. Patient reports no further problems with constipation or abdominal pain. Some problems with hemorrhoids. She remains off Lipitor. We reviewed her workup. She wonders about her lipid levels.  REVIEW OF SYSTEMS:  All non-GI ROS negative except for fatigue  Past Medical History:  Diagnosis Date  . Allergic rhinitis   . BENIGN POSITIONAL VERTIGO 03/03/2010  . Clotting disorder ([place])    a. Anti-cardiolipin antibody syndrome (IgM anticardiolipin, B2-glycoprotein IgM). b. Has had superficial thr. phebitis 06/2011,  . CMV pneumonia ([place])    CMV pneumonia 1994  . Colitis 08/2004   ?Ischemic colitis in 2006.  . Colon polyps   . Diverticulosis    Has had diverticulitis x 2  . Edema    a. LLE post ACL sprain 2010.  . 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 Endometrial cancer ([place]) 01/10/2004   a. S/P Hysterectomy and BSO  . Hemorrhoids   . HIATAL HERNIA 05/26/2009  . Normal cardiac stress test    a. Stress echo (10/11): EF 60-65%, no significant valvular abnormalities. No wall motion abnormalities with exertion, suggesting no  ischemia. b. normal nuclear stress test in 09/2013 done for pre-op   . Obesity   . Other and unspecified hyperlipidemia 06/22/2007  . Palpitations    a. Holter (10/11): 9.6% of beats were atrial ectopy, average HR 78, short runs of up to 5-6 beats of probable atrial tachycardia.   . Pulmonary nodules    a. Followed periodically by CT (Last in 03/2012).  . Reactive airway disease    a. ?RAD wheezes with respiratory infections.  . 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 Superficial thrombophlebitis   . Thyroid nodule    a. Small bilat thyroid nodule by [place] 2013, periodically followed.  Marland Kitchen Unspecified disease of the salivary glands 06/22/2007   Clogged    Past Surgical History:  Procedure Laterality Date  . CHOLECYSTECTOMY    . COLONOSCOPY W/ POLYPECTOMY  01/26/2004   Dr.Aldene Hendon  . fibroidectomy    . LAPAROSCOPY     for 3 ovarian cyst   . LUNG LOBECTOMY Right   . TONSILLECTOMY    . TOTAL ABDOMINAL HYSTERECTOMY W/ BILATERAL SALPINGOOPHORECTOMY  01/2004   For endometrial cancer ; Dr Aldean Ast    Social History [name]  reports that she is a non-smoker but has been exposed to tobacco smoke. She has never used smokeless tobacco. She reports that she does not drink alcohol or use drugs.  family history includes Asthma in her father and sister; Atrial fibrillation in her mother; Colon cancer in her paternal grandmother; Colon polyps in her father; Deep vein thrombosis in her maternal grandmother and mother; Diabetes in her father; Diverticulitis in her  father; Heart attack in her maternal grandmother; Heart attack (age of onset: 78) in her mother; Heart failure in her maternal grandmother and paternal grandfather; Liver disease in her father; Lung cancer in her mother; Prostate cancer in her maternal grandfather; Sudden death in her paternal grandfather.  Allergies  Allergen Reactions  . Vioxx [Rofecoxib] Swelling    Severe swelling of ankles Ankle edema  . Sulfa  Antibiotics Other (See Comments)    Patient is not sure but thinks she may have an allergy to sulfa       PHYSICAL EXAMINATION: Vital signs: BP 110/76 (BP Location: Left Wrist, Patient Position: Sitting, Cuff Size: Normal)   Pulse 80   Ht 5' 5.25" (1.657 m)   Wt 264 lb (119.7 kg)   BMI 43.60 kg/m   Constitutional:Pleasant, obese, otherwise generally well-appearing, no acute distress Psychiatric: alert and oriented x3, cooperative Eyes: extraocular movements intact, anicteric, conjunctiva pink Mouth: oral pharynx moist, no lesions Neck: supple without thyromegaly Lymph: no lymphadenopathy Cardiovascular: heart regular rate and rhythm, no murmur Lungs: clear to auscultation bilaterally Abdomen: soft, obese, nontender, nondistended, no obvious ascites, no peritoneal signs, normal bowel sounds, no organomegaly. Umbilical hernia Rectal: Omitted Extremities: no clubbing cyanosis or lower extremity edema bilaterally Skin: no lesions on visible extremities Neuro: No focal deficits. No asterixis.   ASSESSMENT:  #1. Transient elevation of liver test likely secondary to escalated dose of Lipitor. Normalization of liver tests off Lipitor. Multiple viral and nonviral studies negative except for elevated ANA. #2. Right lower quadrant pain. Negative imaging. Felt secondary to constipation #3. Functional constipation. Improved on MiraLAX #4. History of severe diverticulosis with sigmoid stenosis. Last colonoscopy 2013 #5. Multiple medical problems including but not limited to obesity, hyperlipidemia, pulmonary lymphoma status post partial lobectomy   PLAN:  #1. Repeat liver tests today. If normal no further workup #2. Lipid panel #3. Return to her cardiologist regarding lipid management. May need to look at alternative to Lipitor #4. Continue MiraLAX for constipation #5. Routine screening colonoscopy 2023 #6. Interval GI follow-up as needed  ADDENDUM The patient's liver chest returned  normal as afternoon. Lipids were elevated. The patient was contacted. Above recommendations again reemphasized. She understands

## 2016-06-16 ENCOUNTER — Other Ambulatory Visit: Payer: Self-pay | Admitting: Cardiology

## 2016-07-07 DIAGNOSIS — H25813 Combined forms of age-related cataract, bilateral: Secondary | ICD-10-CM | POA: Diagnosis not present

## 2016-07-24 ENCOUNTER — Other Ambulatory Visit: Payer: Self-pay | Admitting: Cardiology

## 2016-08-03 ENCOUNTER — Other Ambulatory Visit: Payer: Self-pay | Admitting: Cardiology

## 2016-08-03 MED ORDER — METOPROLOL TARTRATE 25 MG PO TABS: ORAL_TABLET | ORAL | 0 refills | Status: DC

## 2016-08-31 ENCOUNTER — Telehealth: Payer: Self-pay | Admitting: Physician Assistant

## 2016-08-31 MED ORDER — METOPROLOL TARTRATE 25 MG PO TABS: ORAL_TABLET | ORAL | 0 refills | Status: DC

## 2016-08-31 NOTE — Telephone Encounter (Signed)
New message       *STAT* If patient is at the pharmacy, call can be transferred to refill team.   1. Which medications need to be refilled? (please list name of each medication and dose if known) metoprolol 25mg   2. Which pharmacy/location (including street and city if local pharmacy) is medication to be sent to? [facility] [address]. Do they need a 30 day or 90 day supply?  Have appt on Monday.  Pt of dr Aundra Dubin.  She called the [facility] clinic and they told her to be seen here.  Pt is out of medication.

## 2016-08-31 NOTE — Telephone Encounter (Signed)
Pt's medication was sent to pt's pharmacy as requested. Confirmation received.  °

## 2016-09-01 ENCOUNTER — Encounter: Payer: Self-pay | Admitting: *Deleted

## 2016-09-04 ENCOUNTER — Ambulatory Visit (INDEPENDENT_AMBULATORY_CARE_PROVIDER_SITE_OTHER): Payer: BLUE CROSS/BLUE SHIELD | Admitting: Physician Assistant

## 2016-09-04 ENCOUNTER — Encounter: Payer: Self-pay | Admitting: Physician Assistant

## 2016-09-04 VITALS — BP 130/90 | HR 70 | Ht 66.0 in | Wt 260.4 lb

## 2016-09-04 DIAGNOSIS — E785 Hyperlipidemia, unspecified: Secondary | ICD-10-CM | POA: Diagnosis not present

## 2016-09-04 DIAGNOSIS — R002 Palpitations: Secondary | ICD-10-CM | POA: Diagnosis not present

## 2016-09-04 DIAGNOSIS — D689 Coagulation defect, unspecified: Secondary | ICD-10-CM

## 2016-09-04 DIAGNOSIS — I2510 Atherosclerotic heart disease of native coronary artery without angina pectoris: Secondary | ICD-10-CM | POA: Diagnosis not present

## 2016-09-04 MED ORDER — ROSUVASTATIN CALCIUM 10 MG PO TABS: 10.0000 mg | ORAL_TABLET | Freq: Every day | ORAL | 11 refills | Status: DC

## 2016-09-04 NOTE — Patient Instructions (Signed)
Medication Instructions:  Your physician has recommended you make the following change in your medication:  1. Start Crestor (10 mg ) daily, sent in today to patient's requested pharmacy.   Labwork: Your physician recommends that you return for a FASTING lipid profile on October 8 between 8-5.   Testing/Procedures: -None  Follow-Up: Dr. Saunders Revel or Dr. Meda Coffee patient will call up and make appointment three months prior to 1 year @ [phone], when patient decides whom want new cardiologist to be.   Any Other Special Instructions Will Be Listed Below (If Applicable).     If you need a refill on your cardiac medications before your next appointment, please call your pharmacy.

## 2016-09-04 NOTE — Progress Notes (Signed)
Cardiology Office Note    Date:  09/04/2016   ID:  Courtany, Mcmurphy [birth year], MRN [id number]  PCP:  Patient, No Pcp Per  Cardiologist: Dr. Aundra Dubin  Chief Complaint  Patient presents with  . Follow-up    History of Present Illness:  [name] is a 61 y.o. female with history of antiphospholipid antibody syndrome, obesity, prior endometrial cancer, and palpitations with brief runs of atrial tachycardia as well as frequent PACs on past monitoring.  3-week monitor in 12/14 (due to palpitations) showed short runs of SVT, no atrial fibrillation.  Echo showed EF 55-60% and did not have significant abnormality.  ETT-Cardiolite in 8/15 showed no ischemia or infarction.     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  This is being followed by serial CTs and is quiescent currently.    Last seen by Dr. Aundra Dubin in 02/2015. Palpitations were controlled with metoprolol, echo showed normal EF and no significant valvular abnormalities. She had extensive coronary calcification on CT surgery recommended LDL below 70.  Patient comes in today for yearly follow-up. Overall she is doing well. She had one or 2 episodes of palpitations in one week about 3 months ago. They lasted about 30 minutes and resolved weekly. The only thing she couldn't attribute it to was stress that she is taking care of 2 family members that are ill. She hasn't had any problems since. She does not get any regular exercise other than walking her dog. Denies chest pain, dyspnea, dyspnea on exertion, dizziness or presyncope. Her Lipitor was stopped because of elevated LFTs. She went to see Dr. Henrene Pastor again find any other cause. Most recent cholesterol was 228 LDL 138.     Past Medical History:  Diagnosis Date  . Allergic rhinitis   . BENIGN POSITIONAL VERTIGO 03/03/2010  . Clotting disorder ([place])    a. Anti-cardiolipin antibody syndrome (IgM anticardiolipin, B2-glycoprotein IgM). b. Has  had superficial thr. phebitis 06/2011,  . CMV pneumonia ([place])    CMV pneumonia 1994  . Colitis 08/2004   ?Ischemic colitis in 2006.  . Colon polyps   . Diverticulosis    Has had diverticulitis x 2  . Edema    a. LLE post ACL sprain 2010.  . Encephalitis age 29   with coma  . Endometrial cancer ([place]) 01/10/2004   a. S/P Hysterectomy and BSO  . Hemorrhoids   . HIATAL HERNIA 05/26/2009  . Normal cardiac stress test    a. Stress echo (10/11): EF 60-65%, no significant valvular abnormalities. No wall motion abnormalities with exertion, suggesting no ischemia. b. normal nuclear stress test in 09/2013 done for pre-op   . Obesity   . Other and unspecified hyperlipidemia 06/22/2007  . Palpitations    a. Holter (10/11): 9.6% of beats were atrial ectopy, average HR 78, short runs of up to 5-6 beats of probable atrial tachycardia.   . Pulmonary nodules    a. Followed periodically by CT (Last in 03/2012).  . Reactive airway disease    a. ?RAD wheezes with respiratory infections.  . 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 Superficial thrombophlebitis   . Thyroid nodule    a. Small bilat thyroid nodule by [place] 2013, periodically followed.  Marland Kitchen Unspecified disease of the salivary glands 06/22/2007   Clogged    Past Surgical History:  Procedure Laterality Date  . CHOLECYSTECTOMY    . COLONOSCOPY W/ POLYPECTOMY  01/26/2004  Dr.Perry  . fibroidectomy    . LAPAROSCOPY     for 3 ovarian cyst   . LUNG LOBECTOMY Right   . TONSILLECTOMY    . TOTAL ABDOMINAL HYSTERECTOMY W/ BILATERAL SALPINGOOPHORECTOMY  01/2004   For endometrial cancer ; Dr Aldean Ast    Current Medications: Current Meds  Medication Sig  . aspirin 81 MG tablet Take 81 mg by mouth daily.    . cholecalciferol (VITAMIN D) 1000 UNITS tablet Take 1,000 Units by mouth daily. Take two by mouth once daily  . Flaxseed, Linseed, (FLAX SEED OIL) 1000 MG CAPS Take 1,000 mg by mouth daily.    . Magnesium 250 MG TABS Take 1 tablet by  mouth daily as needed (cramping).   . metoprolol tartrate (LOPRESSOR) 25 MG tablet Take 1/2 tablet by mouth twice daily.  . Multiple Vitamin (MULTIVITAMIN) tablet Take 1 tablet by mouth daily.       Allergies:   Vioxx [rofecoxib] and Sulfa antibiotics   Social History   Social History  . Marital status: Married    Spouse name: N/A  . Number of children: 0  . Years of education: N/A   Occupational History  . 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 Smoking status: Passive Smoke Exposure - Never Smoker  . Smokeless tobacco: Never Used     Comment: passive exposure 2nd hand x 18 years  . Alcohol use No  . Drug use: No  . Sexual activity: Not on file   Other Topics Concern  . Not on file   Social History Narrative  . No narrative on file     Family History:  The patient's family history includes Asthma in her father and sister; Atrial fibrillation in her mother and unknown relative; Colon cancer in her paternal grandmother; Colon polyps in her father; Deep vein thrombosis in her maternal grandmother and mother; Diabetes in her father; Diverticulitis in her father; Heart attack in her maternal grandmother; Heart attack (age of onset: 62) in her mother; Heart failure in her maternal grandmother, paternal grandfather, and unknown relative; Liver disease in her father; Lung cancer in her mother; Prostate cancer in her maternal grandfather; Stroke in her unknown relative; Sudden death in her paternal grandfather.   ROS:   Please see the history of present illness.    Review of Systems  Constitution: Negative.  HENT: Negative.   Eyes: Negative.   Cardiovascular: Positive for palpitations.  Respiratory: Negative.   Hematologic/Lymphatic: Negative.   Musculoskeletal: Negative.  Negative for joint pain.  Gastrointestinal: Negative.   Genitourinary: Negative.   Neurological: Negative.    All other systems reviewed and are negative.   PHYSICAL  EXAM:   VS:  BP 130/90 (BP Location: Left Arm, Patient Position: Sitting, Cuff Size: Large)   Pulse 70   Ht 5\' 6"  (1.676 m)   Wt 260 lb 6.4 oz (118.1 kg)   BMI 42.03 kg/m   Physical Exam  GEN: Obese, in no acute distress  Neck: no JVD, carotid bruits, or masses Cardiac:RRR; no murmurs, rubs, or gallops  Respiratory:  clear to auscultation bilaterally, normal work of breathing GI: soft, nontender, nondistended, + BS Ext: without cyanosis, clubbing, or edema, Good distal pulses bilaterally Neuro:  Alert and Oriented x 3 Psych: euthymic mood, full affect  Wt Readings from Last 3 Encounters:  09/04/16 260 lb 6.4 oz (118.1 kg)  04/28/16 264 lb (119.7 kg)  11/18/15 271 lb 8 oz (123.2  kg)      Studies/Labs Reviewed:   EKG:  EKG is  ordered today.  The ekg ordered today demonstrates Normal sinus rhythm, normal EKG  Recent Labs: 12/16/2015: BUN 12; Creatinine, Ser 0.92; Hemoglobin 13.7; Platelets 224; Potassium 3.8; Sodium 138 04/28/2016: ALT 19   Lipid Panel    Component Value Date/Time   CHOL 228 (H) 04/28/2016 1605   TRIG 169.0 (H) 04/28/2016 1605   HDL 55.80 04/28/2016 1605   CHOLHDL 4 04/28/2016 1605   VLDL 33.8 04/28/2016 1605   LDLCALC 138 (H) 04/28/2016 1605   LDLDIRECT 152.5 08/07/2011 0938    Additional studies/ records that were reviewed today include:   CT 12/2017IMPRESSION: Postsurgical changes in the right lower lobe.   No evidence of pulmonary embolism.   Mild right basilar atelectasis/ early infiltrate. Small hilar and a subcarinal node are seen. These are likely reactive in nature. The size of the subcarinal lymph node warrant some followup in 3-6 months.   FINDINGS: Cardiovascular: Thoracic aorta demonstrates minimal atherosclerotic calcification. Mild coronary calcifications are seen. No aneurysmal dilatation or dissection is identified. The pulmonary artery shows a normal branching pattern without intraluminal filling defect to suggest pulmonary  embolism  2-D echo 2014Study Conclusions  - Left ventricle: The cavity size was normal. Systolic   function was normal. The estimated ejection fraction was   in the range of 55% to 60%. Wall motion was normal; there   were no regional wall motion abnormalities. Doppler   parameters are consistent with abnormal left ventricular   relaxation (grade 1 diastolic dysfunction). - Pulmonary arteries: Systolic pressure was within the   normal range. Nuclear stress test 2015Overall Impression:  Normal stress nuclear study with fixed anterior defect secondary to breast attenuation.   LV Ejection Fraction: 71%.  LV Wall Motion:  NL LV Function; NL Wall Motion   NELSON, KATARINA H 09/08/2013   Normal study with no evidence for ischemia or infarction. OK for surgery.    Loralie Champagne 09/09/2013 1:29 PM   ASSESSMENT:    1. Palpitations   2. Clotting disorder ([place])   3. 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 Morbid obesity ([place])   5. Hyperlipidemia, unspecified hyperlipidemia type      PLAN:  In order of problems listed above:  Palpitations well controlled on metoprolol  Clotting disorder followed by Dr. never on aspirin 81 mg daily  CAD with coronary stent calcification on CT 12/2015 asymptomatic normal nuclear stress test in 2015. Continue risk reduction. Will rechallenge with Crestor. Patient would like to follow-up with Dr. Saunders Revel or Dr. Meda Coffee in 1 year.She will call to schedule.  Morbid obesity weight loss program such as Weight Watchers and exercise recommended.  Hyperlipidemia with history of increased LFTs on Lipitor. Discussed with lipid clinic and will rechallenge with Crestor 10 mg daily. Repeat liver and LFTs in 4-6 weeks.    Medication Adjustments/Labs and Tests Ordered: Current medicines are reviewed at length with the patient today.  Concerns regarding medicines are outlined above.  Medication changes, Labs  and Tests ordered today are listed in the Patient Instructions below. There are no Patient Instructions on file for this visit.   Sumner Boast, PA-C  09/04/2016 10:52 AM    [facility] [place], [place], [place]  [address] Phone: [phone]; Fax: [phone]

## 2016-10-03 ENCOUNTER — Other Ambulatory Visit: Payer: Self-pay | Admitting: Physician Assistant

## 2016-10-16 ENCOUNTER — Other Ambulatory Visit: Payer: BLUE CROSS/BLUE SHIELD

## 2016-10-27 ENCOUNTER — Other Ambulatory Visit: Payer: BLUE CROSS/BLUE SHIELD

## 2016-12-11 ENCOUNTER — Other Ambulatory Visit: Payer: BLUE CROSS/BLUE SHIELD

## 2017-01-16 IMAGING — DX DG RIBS W/ CHEST 3+V*R*
4 series · 4 of 4 positions shown · non-contrast
Comparison: 12/23/2015

CLINICAL DATA: Right-sided chest pain over the last week. No known
injury. Previous surgery.

EXAM:
RIGHT RIBS AND CHEST - 3+ VIEW

[chest pa]
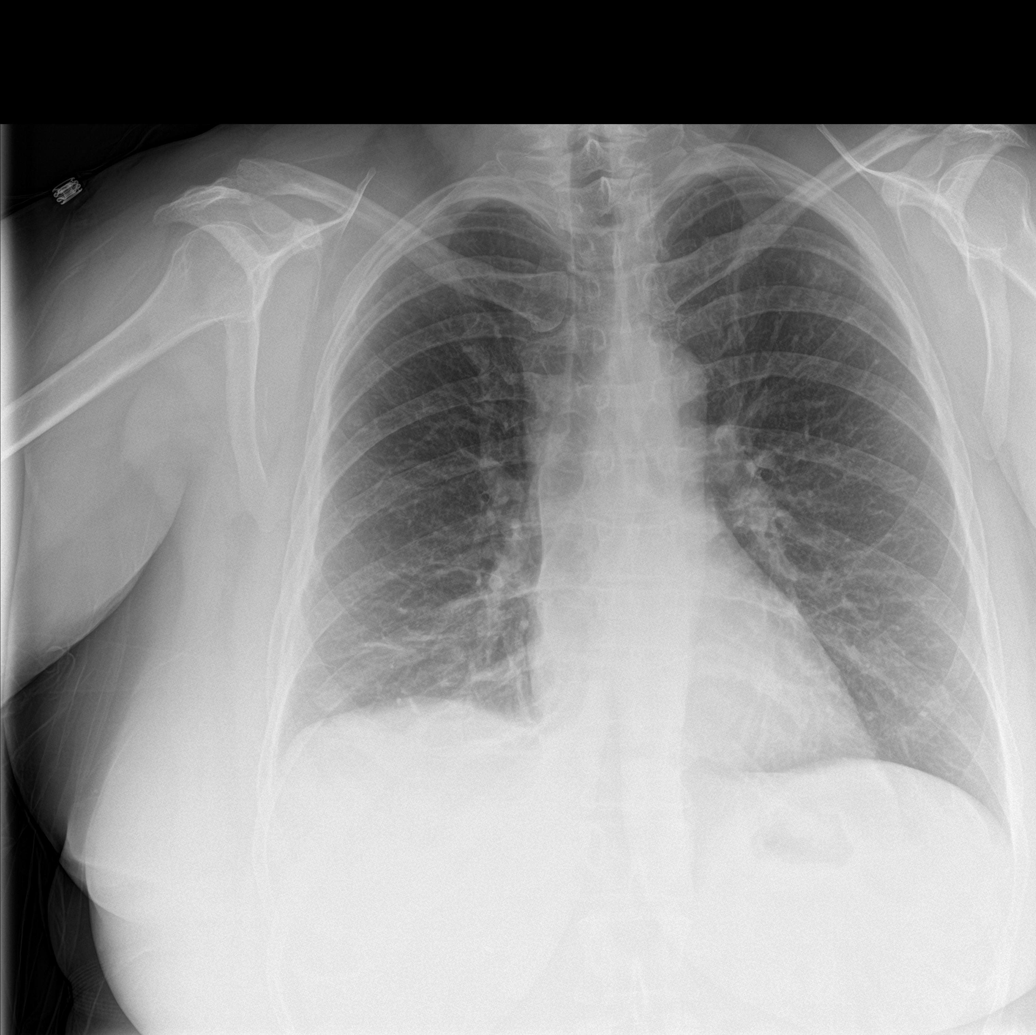

[rib pa]
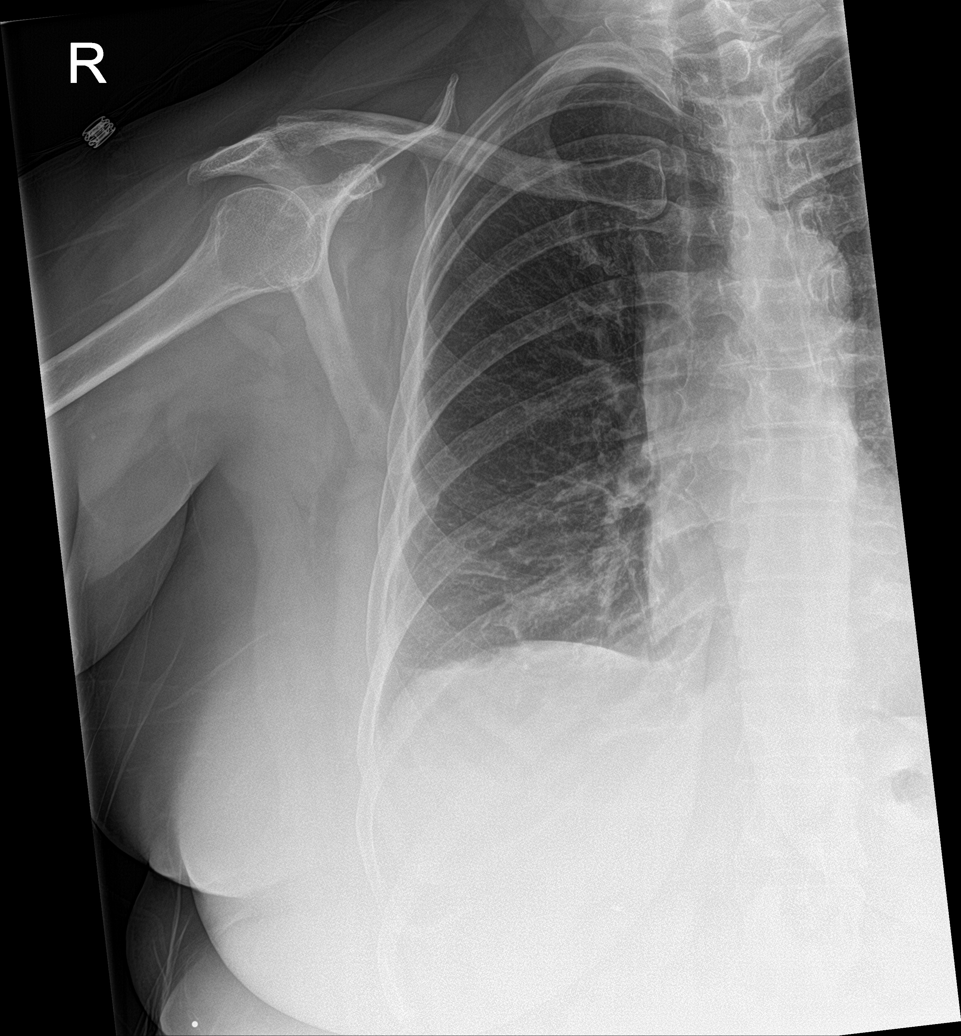

[rib pa obl]
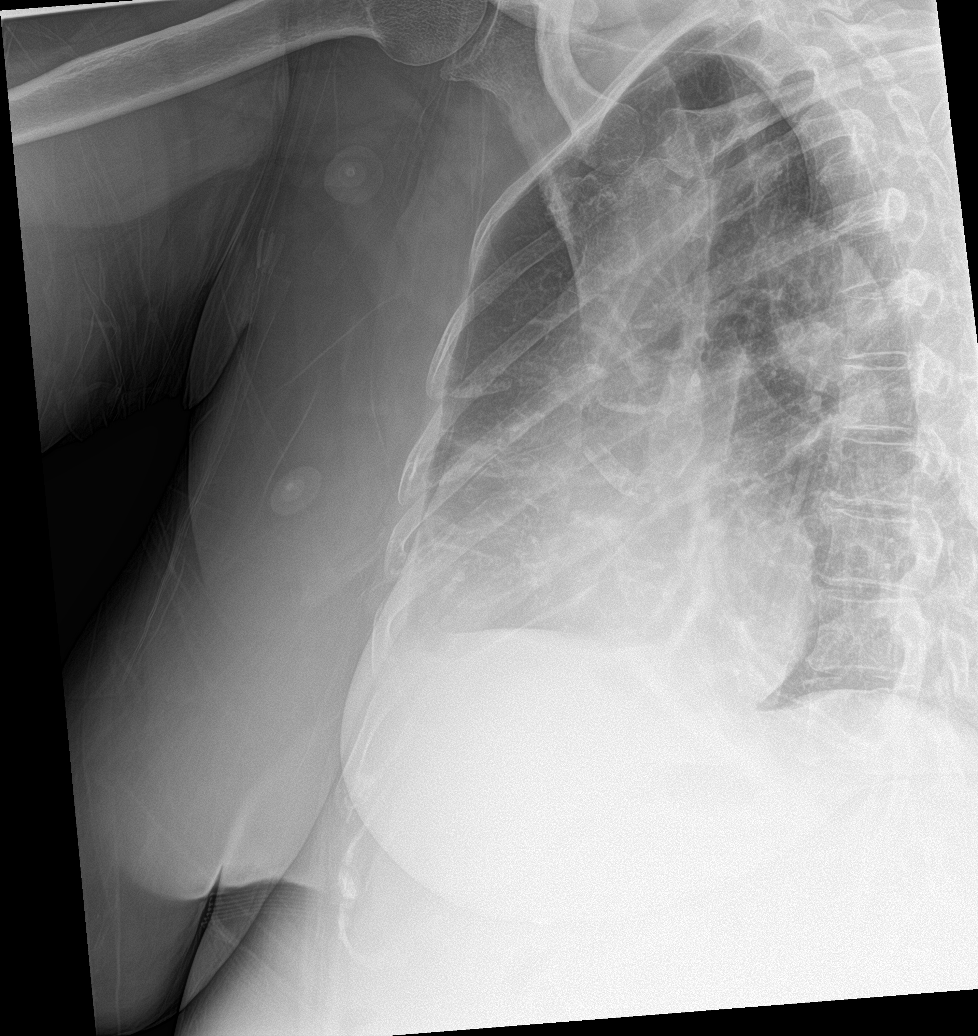

[rib ap]
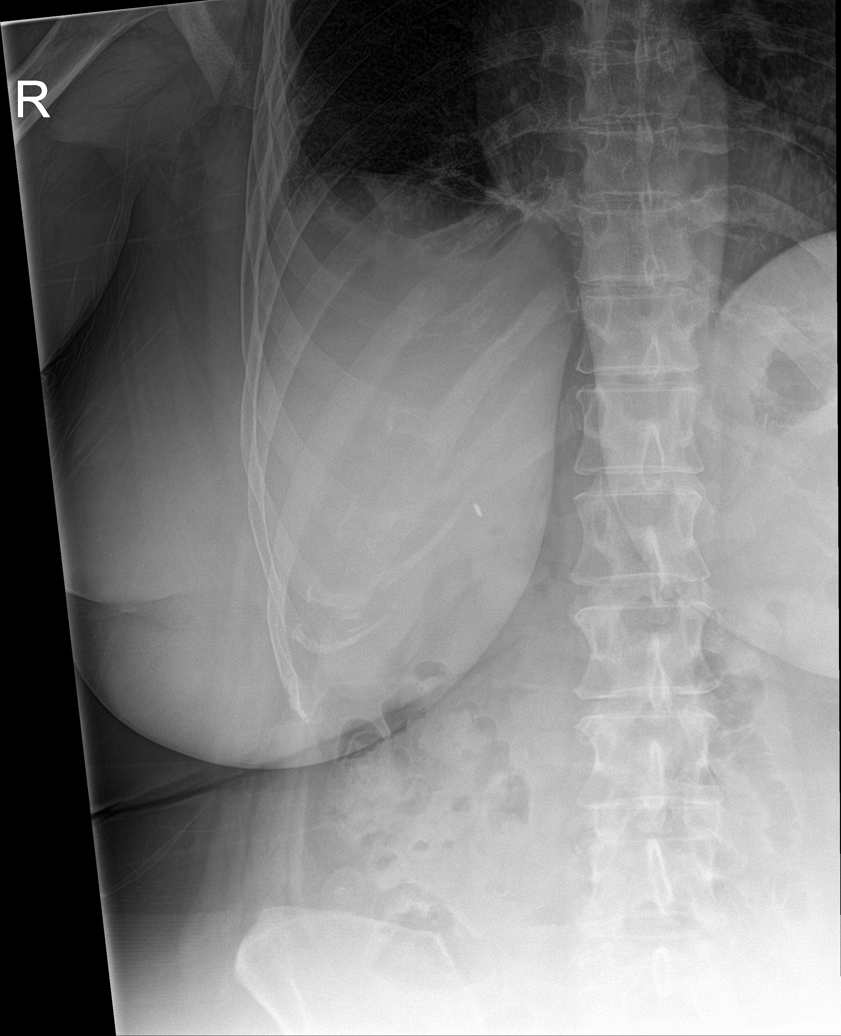

[4 of 4 positions shown; findings below may reference images not displayed]

FINDINGS: There is abnormal density in the right lower lung presumed to
represent scarring from previous resection at the medial right base.
Cannot rule out an element of active atelectasis or pneumonia. No
regional rib abnormality is seen.
IMPRESSION: Abnormal density at the right lung base presumed secondary to
previous pulmonary resection in the medial right base. Cannot rule
out an element of active atelectasis or pneumonia. No rib
abnormality seen.

## 2017-02-22 DIAGNOSIS — R911 Solitary pulmonary nodule: Secondary | ICD-10-CM | POA: Diagnosis not present

## 2017-02-22 DIAGNOSIS — Z23 Encounter for immunization: Secondary | ICD-10-CM | POA: Diagnosis not present

## 2017-07-16 ENCOUNTER — Ambulatory Visit: Payer: Self-pay

## 2017-07-16 NOTE — Progress Notes (Deleted)
Subjective:    Patient ID: [name], female    DOB: 08/12/1955, 62 y.o.   MRN: [id number]  HPI The patient is here for an acute visit.   Neck pain:    Medications and allergies reviewed with patient and updated if appropriate.  Patient Active Problem List   Diagnosis Date Noted  . History of basal cell cancer 12/18/2014  . Elevated alkaline phosphatase level 11/29/2014  . 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 Hyperlipidemia 12/14/2013  . CAD (coronary artery disease) 12/14/2013  . History of endometrial cancer   . Obesity   . Clotting disorder ([place])   . Nodule of right lung   . Reactive airway disease   . Normal cardiac stress test   . Meningioma ([place]) 09/27/2013  . Severe obesity (BMI >= 40) ([place]) 07/16/2013  . Exertional dyspnea 12/10/2012  . Abdominal pain 06/26/2012  . Positive D dimer 10/07/2011  . Morbid obesity ([place]) 09/08/2011  . History of non-Hodgkin's lymphoma 08/04/2011  . Fatigue 07/31/2011  . BENIGN POSITIONAL VERTIGO 03/03/2010  . THYROID FUNCTION TEST, ABNORMAL 03/03/2010  . CHEST PAIN 10/07/2009  . Tear film insufficiency 07/22/2009  . DRY MOUTH 07/22/2009  . DRY SKIN 07/22/2009  . Palpitations 07/22/2009  . HIATAL HERNIA 05/26/2009  . DIVERTICULITIS OF COLON 04/19/2009  . COLONIC POLYPS, HX OF 04/19/2009  . OTHER AND UNSPECIFIED COAGULATION DEFECTS 03/08/2009  . UNSPECIFIED MENOPAUSAL&POSTMENOPAUSAL DISORDER 09/16/2008  . Other and unspecified hyperlipidemia 06/22/2007  . UNSPECIFIED DISEASE OF THE SALIVARY GLANDS 06/22/2007  . ALLERGIC RHINITIS DUE TO OTHER ALLERGEN 02/23/2007    Current Outpatient Medications on File Prior to Visit  Medication Sig Dispense Refill  . aspirin 81 MG tablet Take 81 mg by mouth daily.      . cholecalciferol (VITAMIN D) 1000 UNITS tablet Take 1,000 Units by mouth daily. Take two by mouth once daily    . Flaxseed, Linseed, (FLAX SEED OIL) 1000 MG CAPS Take 1,000 mg by  mouth daily.      . Magnesium 250 MG TABS Take 1 tablet by mouth daily as needed (cramping).     . metoprolol tartrate (LOPRESSOR) 25 MG tablet TAKE 1/2 TABLET BY MOUTH TWICE A DAY 30 tablet 10  . Multiple Vitamin (MULTIVITAMIN) tablet Take 1 tablet by mouth daily.      . rosuvastatin (CRESTOR) 10 MG tablet Take 1 tablet (10 mg total) by mouth daily. 30 tablet 11  . vitamin C (ASCORBIC ACID) 500 MG tablet Take 500 mg by mouth daily. As directed     No current facility-administered medications on file prior to visit.     Past Medical History:  Diagnosis Date  . Allergic rhinitis   . BENIGN POSITIONAL VERTIGO 03/03/2010  . Clotting disorder ([place])    a. Anti-cardiolipin antibody syndrome (IgM anticardiolipin, B2-glycoprotein IgM). b. Has had superficial thr. phebitis 06/2011,  . 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 Colitis 08/2004   ?Ischemic colitis in 2006.  . Colon polyps   . Diverticulosis    Has had diverticulitis x 2  . Edema    a. LLE post ACL sprain 2010.  . 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 Endometrial cancer ([place]) 01/10/2004   a. S/P Hysterectomy and BSO  . Hemorrhoids   . HIATAL HERNIA 05/26/2009  . Normal cardiac stress test    a. Stress echo (10/11): EF 60-65%, no significant valvular abnormalities. No wall motion  abnormalities with exertion, suggesting no ischemia. b. normal nuclear stress test in 09/2013 done for pre-op   . Obesity   . Other and unspecified hyperlipidemia 06/22/2007  . Palpitations    a. Holter (10/11): 9.6% of beats were atrial ectopy, average HR 78, short runs of up to 5-6 beats of probable atrial tachycardia.   . Pulmonary nodules    a. Followed periodically by CT (Last in 03/2012).  . Reactive airway disease    a. ?RAD wheezes with respiratory infections.  . 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 Superficial thrombophlebitis   . Thyroid nodule    a. Small bilat thyroid nodule by [place] 2013, periodically followed.  Marland Kitchen Unspecified  disease of the salivary glands 06/22/2007   Clogged    Past Surgical History:  Procedure Laterality Date  . CHOLECYSTECTOMY    . COLONOSCOPY W/ POLYPECTOMY  01/26/2004   Dr.Perry  . fibroidectomy    . LAPAROSCOPY     for 3 ovarian cyst   . LUNG LOBECTOMY Right   . TONSILLECTOMY    . TOTAL ABDOMINAL HYSTERECTOMY W/ BILATERAL SALPINGOOPHORECTOMY  01/2004   For endometrial cancer ; Dr Aldean Ast    Social History   Socioeconomic History  . Marital status: Married    Spouse name: Not on file  . Number of children: 0  . Years of education: Not on file  . Highest education level: Not on file  Occupational History  . Occupation: Occupational hygienist: SELF EMPLOYED  Social Needs  . Financial resource strain: Not on file  . Food insecurity:    Worry: Not on file    Inability: Not on file  . Transportation needs:    Medical: Not on file    Non-medical: Not on file  Tobacco Use  . Smoking status: Passive Smoke Exposure - Never Smoker  . Smokeless tobacco: Never Used  . Tobacco comment: passive exposure 2nd hand x 18 years  Substance and Sexual Activity  . Alcohol use: No  . Drug use: No  . Sexual activity: Not on file  Lifestyle  . Physical activity:    Days per week: Not on file    Minutes per session: Not on file  . Stress: Not on file  Relationships  . Social connections:    Talks on phone: Not on file    Gets together: Not on file    Attends religious service: Not on file    Active member of club or organization: Not on file    Attends meetings of clubs or organizations: Not on file    Relationship status: Not on file  Other Topics Concern  . Not on file  Social History Narrative  . Not on file    Family History  Problem Relation Age of Onset  . Asthma Father   . Diabetes Father   . Liver disease Father        Liver transplant  . Diverticulitis Father   . Colon polyps Father   . Heart attack Mother 13  . Atrial fibrillation  Mother   . Deep vein thrombosis Mother   . Lung cancer Mother   . Stroke Unknown        Grandmother (? M or P)  . Heart failure Unknown        Grandmother (?M or P)  . Sudden death Paternal Grandfather   . Heart failure Paternal Grandfather   . Atrial  fibrillation Unknown        Grandmother (? M or P)  . Asthma Sister   . Prostate cancer Maternal Grandfather   . Heart attack Maternal Grandmother        Questionable  . Heart failure Maternal Grandmother        Questionable  . Deep vein thrombosis Maternal Grandmother   . Colon cancer Paternal Grandmother        Questionable    Review of Systems     Objective:  There were no vitals filed for this visit. BP Readings from Last 3 Encounters:  09/04/16 130/90  04/28/16 110/76  12/16/15 153/79   Wt Readings from Last 3 Encounters:  09/04/16 260 lb 6.4 oz (118.1 kg)  04/28/16 264 lb (119.7 kg)  11/18/15 271 lb 8 oz (123.2 kg)   There is no height or weight on file to calculate BMI.   Physical Exam         Assessment & Plan:    See Problem List for Assessment and Plan of chronic medical problems.

## 2017-07-16 NOTE — Telephone Encounter (Signed)
Patient called in with c/o "neck pain." She says "I woke up last Friday with neck pain, my neck is stiff. It hurts at the base of the skull at a 7 when I'm moving it, but hardly any pain when I'm still." I asked about numbness, tingling, she denies. I asked about injury, she denies. I asked about pain medication, she says "I take Aleve and it helps some." I asked about other symptoms, she says "I had a severe headache a few weeks before, that's the only thing." According to protocol, see PCP within 3 days, appointment scheduled for tomorrow at 0930 with Dr. Quay Burow, care advice given, patient verbalized understanding.  Reason for Disposition . [1] MODERATE neck pain (e.g., interferes with normal activities AND [2] present > 3 days  Answer Assessment - Initial Assessment Questions 1. ONSET: "When did the pain begin?"      Friday of last week 2. LOCATION: "Where does it hurt?"      Base of skull 3. PATTERN "Does the pain come and go, or has it been constant since it started?"      Constant when moving 4. SEVERITY: "How bad is the pain?"  (Scale 1-10; or mild, moderate, severe)   - MILD (1-3): doesn't interfere with normal activities    - MODERATE (4-7): interferes with normal activities or awakens from sleep    - SEVERE (8-10):  excruciating pain, unable to do any normal activities      7 when moving; resting mild 5. RADIATION: "Does the pain go anywhere else, shoot into your arms?"     No 6. CORD SYMPTOMS: "Any weakness or numbness of the arms or legs?"     No 7. CAUSE: "What do you think is causing the neck pain?"     No 8. NECK OVERUSE: "Any recent activities that involved turning or twisting the neck?"     No 9. OTHER SYMPTOMS: "Do you have any other symptoms?" (e.g., headache, fever, chest pain, difficulty breathing, neck swelling)    Headache a few weeks ago 10. PREGNANCY: "Is there any chance you are pregnant?" "When was your last menstrual period?"       No  Protocols used: NECK  PAIN OR STIFFNESS-A-AH

## 2017-07-17 ENCOUNTER — Ambulatory Visit: Payer: BLUE CROSS/BLUE SHIELD | Admitting: Internal Medicine

## 2017-07-26 ENCOUNTER — Inpatient Hospital Stay: Payer: BLUE CROSS/BLUE SHIELD | Attending: Hematology and Oncology | Admitting: Hematology and Oncology

## 2017-07-26 ENCOUNTER — Encounter: Payer: Self-pay | Admitting: Hematology and Oncology

## 2017-07-26 ENCOUNTER — Inpatient Hospital Stay: Payer: BLUE CROSS/BLUE SHIELD

## 2017-07-26 DIAGNOSIS — M2550 Pain in unspecified joint: Secondary | ICD-10-CM | POA: Diagnosis not present

## 2017-07-26 DIAGNOSIS — C8299 Follicular lymphoma, unspecified, extranodal and solid organ sites: Secondary | ICD-10-CM

## 2017-07-26 DIAGNOSIS — D329 Benign neoplasm of meninges, unspecified: Secondary | ICD-10-CM | POA: Insufficient documentation

## 2017-07-26 DIAGNOSIS — R946 Abnormal results of thyroid function studies: Secondary | ICD-10-CM

## 2017-07-26 DIAGNOSIS — Z803 Family history of malignant neoplasm of breast: Secondary | ICD-10-CM | POA: Diagnosis not present

## 2017-07-26 DIAGNOSIS — R1310 Dysphagia, unspecified: Secondary | ICD-10-CM | POA: Insufficient documentation

## 2017-07-26 DIAGNOSIS — Z7982 Long term (current) use of aspirin: Secondary | ICD-10-CM | POA: Diagnosis not present

## 2017-07-26 DIAGNOSIS — Z8572 Personal history of non-Hodgkin lymphomas: Secondary | ICD-10-CM

## 2017-07-26 DIAGNOSIS — Z79899 Other long term (current) drug therapy: Secondary | ICD-10-CM | POA: Diagnosis not present

## 2017-07-26 DIAGNOSIS — R918 Other nonspecific abnormal finding of lung field: Secondary | ICD-10-CM

## 2017-07-26 DIAGNOSIS — Z85828 Personal history of other malignant neoplasm of skin: Secondary | ICD-10-CM

## 2017-07-26 DIAGNOSIS — Z8542 Personal history of malignant neoplasm of other parts of uterus: Secondary | ICD-10-CM

## 2017-07-26 DIAGNOSIS — G8929 Other chronic pain: Secondary | ICD-10-CM | POA: Diagnosis not present

## 2017-07-26 DIAGNOSIS — R5383 Other fatigue: Secondary | ICD-10-CM | POA: Insufficient documentation

## 2017-07-26 LAB — CBC WITH DIFFERENTIAL/PLATELET
BASOS PCT: 0 %
Basophils Absolute: 0 10*3/uL (ref 0.0–0.1)
EOS ABS: 0 10*3/uL (ref 0.0–0.5)
EOS PCT: 0 %
HCT: 41.7 % (ref 34.8–46.6)
Hemoglobin: 13.8 g/dL (ref 11.6–15.9)
LYMPHS ABS: 1.5 10*3/uL (ref 0.9–3.3)
Lymphocytes Relative: 24 %
MCH: 30.9 pg (ref 25.1–34.0)
MCHC: 33.1 g/dL (ref 31.5–36.0)
MCV: 93.3 fL (ref 79.5–101.0)
MONOS PCT: 6 %
Monocytes Absolute: 0.4 10*3/uL (ref 0.1–0.9)
Neutro Abs: 4.5 10*3/uL (ref 1.5–6.5)
Neutrophils Relative %: 70 %
PLATELETS: 215 10*3/uL (ref 145–400)
RBC: 4.47 MIL/uL (ref 3.70–5.45)
RDW: 13.4 % (ref 11.2–14.5)
WBC: 6.4 10*3/uL (ref 3.9–10.3)

## 2017-07-26 LAB — COMPREHENSIVE METABOLIC PANEL
ALBUMIN: 4 g/dL (ref 3.5–5.0)
ALT: 24 U/L (ref 0–44)
ANION GAP: 8 (ref 5–15)
AST: 25 U/L (ref 15–41)
Alkaline Phosphatase: 138 U/L — ABNORMAL HIGH (ref 38–126)
BUN: 15 mg/dL (ref 8–23)
CHLORIDE: 104 mmol/L (ref 98–111)
CO2: 29 mmol/L (ref 22–32)
Calcium: 9.8 mg/dL (ref 8.9–10.3)
Creatinine, Ser: 0.97 mg/dL (ref 0.44–1.00)
GFR calc non Af Amer: >60 mL/min (ref >60)
Glucose, Bld: 95 mg/dL (ref 70–99)
POTASSIUM: 4.5 mmol/L (ref 3.5–5.1)
SODIUM: 141 mmol/L (ref 135–145)
Total Bilirubin: 0.4 mg/dL (ref 0.3–1.2)
Total Protein: 7.3 g/dL (ref 6.5–8.1)

## 2017-07-26 LAB — SEDIMENTATION RATE: SED RATE: 40 mm/h — AB (ref 0–22)

## 2017-07-26 LAB — LACTATE DEHYDROGENASE: LDH: 201 U/L — ABNORMAL HIGH (ref 98–192)

## 2017-07-27 ENCOUNTER — Telehealth: Payer: Self-pay | Admitting: *Deleted

## 2017-07-27 DIAGNOSIS — G8929 Other chronic pain: Secondary | ICD-10-CM | POA: Insufficient documentation

## 2017-07-27 DIAGNOSIS — M2550 Pain in unspecified joint: Secondary | ICD-10-CM

## 2017-07-27 DIAGNOSIS — R1310 Dysphagia, unspecified: Secondary | ICD-10-CM | POA: Insufficient documentation

## 2017-07-27 LAB — VITAMIN D 25 HYDROXY (VIT D DEFICIENCY, FRACTURES): Vit D, 25-Hydroxy: 54.5 ng/mL (ref 30.0–100.0)

## 2017-07-27 LAB — TSH: TSH: 2.941 u[IU]/mL (ref 0.308–3.960)

## 2017-07-27 NOTE — Assessment & Plan Note (Addendum)
She has significant concern for other systemic forms of lymphoma We discussed the natural history of lymphoma With early stage low-grade disease, she is likely cured Examination is benign There is no role for surveillance imaging study of her abdomen or pelvis She had frequent imaging studies at [facility] which showed no new lymphadenopathy to indicate cancer recurrence She has multiple nodules that are being followed at [facility]

## 2017-07-27 NOTE — Telephone Encounter (Signed)
Mailbox full

## 2017-07-27 NOTE — Assessment & Plan Note (Signed)
She denies headache or new neurological deficit She will continue close follow-up at [facility]

## 2017-07-27 NOTE — Assessment & Plan Note (Signed)
She has expressed significant concerns about potential cancer recurrence in association with family history of cancer The patient have early stage disease and is likely to be cured There is no role for surveillance imaging She has no symptoms to indicate cancer recurrence Her sister tested negative for BRCA mutation I reassured the patient she does not need further work-up for this

## 2017-07-27 NOTE — Progress Notes (Signed)
[facility] OFFICE PROGRESS NOTE  Patient Care Team: Patient, No Pcp Per as PCP - General (General Practice)  ASSESSMENT & PLAN:  History of endometrial cancer She has expressed significant concerns about potential cancer recurrence in association with family history of cancer The patient have early stage disease and is likely to be cured There is no role for surveillance imaging She has no symptoms to indicate cancer recurrence Her sister tested negative for BRCA mutation I reassured the patient she does not need further work-up for this  History of non-Hodgkin's lymphoma She has significant concern for other systemic forms of lymphoma We discussed the natural history of lymphoma With early stage low-grade disease, she is likely cured Examination is benign There is no role for surveillance imaging study of her abdomen or pelvis She had frequent imaging studies at [facility] which showed no new lymphadenopathy to indicate cancer recurrence She has multiple nodules that are being followed at [facility]  Fatigue She has chronic excessive fatigue The patient sleeps poorly and is undergoing tremendous stress She works every day without a break She has no time to exercise I recommend dietary modification and exercise as tolerated Repeat blood work did not indicate any signs of anemia or thyroid dysfunction to cause excessive fatigue  Chronic joint pain She has diffuse chronic joint pain The patient is morbidly obese Serum vitamin D level is adequate I recommend graduated exercise as tolerated and weight loss strategies Sedimentation rate is mildly elevated and could indicate chronic inflammatory arthritis She can take Tylenol as needed  Dysphagia She has nonspecific sensation of dysphagia I recommend follow-up with primary care doctor or GI physician for further work-up and follow-up The patient has not lost any weight because of this. She has perceived sensation of swelling  around her neck On examination, except for fatty tissue around her neck, there is no appreciable lymphadenopathy  Meningioma [facility]) She denies headache or new neurological deficit She will continue close follow-up at [facility] This Encounter  Procedures  . Comprehensive metabolic panel    Standing Status:   Future    Number of Occurrences:   1    Standing Expiration Date:   08/30/2018  . CBC with Differential/Platelet    Standing Status:   Future    Number of Occurrences:   1    Standing Expiration Date:   08/30/2018  . Lactate dehydrogenase    Standing Status:   Future    Number of Occurrences:   1    Standing Expiration Date:   07/27/2018  . TSH    Standing Status:   Future    Number of Occurrences:   1    Standing Expiration Date:   08/30/2018  . VITAMIN D 25 Hydroxy (Vit-D Deficiency, Fractures)    Standing Status:   Future    Number of Occurrences:   1    Standing Expiration Date:   07/27/2018  . Sedimentation rate    Standing Status:   Future    Number of Occurrences:   1    Standing Expiration Date:   08/30/2018    INTERVAL HISTORY: Please see below for problem oriented charting. The patient has multiple missed today She is being follow-up because of history of lymphoma noted from lung biopsy and meningioma She also brought up her concerns about difficulties with swallowing and sensation of fullness around her neck She complained of excessive fatigue She does not have a primary care doctor to follow-up  on some of the symptoms She complained of diffuse joint pain affecting her hips and her knees The patient has stress for work at home.  She does not take a break and have no scheduled days off She has strong family history of cancer including a sister who is undergoing chemotherapy for ovarian cancer Her sister tested negative for genetic testing She is concerned about her cancer risks She denies recent infection, fever or chills No new  lymphadenopathy Denies anorexia, abnormal weight loss or night sweats She follows at [facility] with multiple imaging studies  of the chest and the brain.  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 No evidence of pulmonary embolism. Mild right basilar atelectasis/ early infiltrate. Small hilar and a subcarinal node are seen. These are likely reactive in nature. The size of the subcarinal lymph node warrant some followup in 3-6 months.       REVIEW OF SYSTEMS:   Constitutional: Denies fevers, chills or abnormal weight loss Eyes: Denies blurriness of vision Ears, nose, mouth, throat, and face: Denies mucositis or sore throat Respiratory: Denies cough, dyspnea or wheezes Cardiovascular: Denies palpitation, chest discomfort or lower extremity swelling Gastrointestinal:  Denies nausea, heartburn or change in bowel habits Skin: Denies abnormal skin rashes Lymphatics: Denies new lymphadenopathy or easy bruising Neurological:Denies numbness, tingling or new weaknesses Behavioral/Psych: Mood is stable, no new changes  All other systems were reviewed with the patient and are negative.  I have reviewed the past medical history, past surgical history, social history and family history with the patient and they are unchanged from previous note.  ALLERGIES:  is allergic to vioxx [rofecoxib] and sulfa antibiotics.  MEDICATIONS:  Current Outpatient Medications  Medication Sig Dispense Refill  . aspirin 81 MG tablet Take 81 mg by mouth daily.      . cholecalciferol (VITAMIN D) 1000 UNITS tablet Take 1,000 Units by mouth daily. Take two by mouth once daily    . Flaxseed, Linseed, (FLAX SEED OIL) 1000 MG CAPS Take 1,000 mg by mouth daily.      . Magnesium 250 MG TABS Take 1 tablet by mouth daily as needed (cramping).     . metoprolol tartrate (LOPRESSOR) 25 MG tablet  TAKE 1/2 TABLET BY MOUTH TWICE A DAY 30 tablet 10  . Multiple Vitamin (MULTIVITAMIN) tablet Take 1 tablet by mouth daily.      . rosuvastatin (CRESTOR) 10 MG tablet Take 1 tablet (10 mg total) by mouth daily. 30 tablet 11  . vitamin C (ASCORBIC ACID) 500 MG tablet Take 500 mg by mouth daily. As directed     No current facility-administered medications for this visit.     PHYSICAL EXAMINATION: ECOG PERFORMANCE STATUS: 1 - Symptomatic but completely ambulatory Heart rate is 70 Respiration rate 18 Blood pressure 154/96 Weight 254 pounds GENERAL:alert, no distress and comfortable SKIN: skin color, texture, turgor are normal, no rashes or significant lesions EYES: normal, Conjunctiva are pink and non-injected, sclera clear OROPHARYNX:no exudate, no erythema and lips, buccal mucosa, and tongue normal  NECK: supple, thyroid normal size, non-tender, without nodularity LYMPH:  no palpable lymphadenopathy in the cervical, axillary or inguinal LUNGS: clear to auscultation and percussion with normal breathing effort HEART: regular rate & rhythm and no murmurs and no lower extremity edema ABDOMEN:abdomen soft, non-tender and normal bowel sounds Musculoskeletal:no cyanosis of digits and no clubbing  NEURO: alert & oriented x 3 with fluent speech, no focal motor/sensory deficits  LABORATORY DATA:  I have reviewed the data as listed    Component Value Date/Time   NA 141 07/26/2017 1448   K 4.5 07/26/2017 1448   CL 104 07/26/2017 1448   CO2 29 07/26/2017 1448   GLUCOSE 95 07/26/2017 1448   BUN 15 07/26/2017 1448   CREATININE 0.97 07/26/2017 1448   CREATININE 0.92 09/29/2015 1215   CALCIUM 9.8 07/26/2017 1448   PROT 7.3 07/26/2017 1448   ALBUMIN 4.0 07/26/2017 1448   AST 25 07/26/2017 1448   ALT 24 07/26/2017 1448   ALKPHOS 138 (H) 07/26/2017 1448   BILITOT 0.4 07/26/2017 1448   GFRNONAA >60 07/26/2017 1448   GFRAA >60 07/26/2017 1448    No results found for: SPEP, UPEP  Lab Results   Component Value Date   WBC 6.4 07/26/2017   NEUTROABS 4.5 07/26/2017   HGB 13.8 07/26/2017   HCT 41.7 07/26/2017   MCV 93.3 07/26/2017   PLT 215 07/26/2017      Chemistry      Component Value Date/Time   NA 141 07/26/2017 1448   K 4.5 07/26/2017 1448   CL 104 07/26/2017 1448   CO2 29 07/26/2017 1448   BUN 15 07/26/2017 1448   CREATININE 0.97 07/26/2017 1448   CREATININE 0.92 09/29/2015 1215      Component Value Date/Time   CALCIUM 9.8 07/26/2017 1448   ALKPHOS 138 (H) 07/26/2017 1448   AST 25 07/26/2017 1448   ALT 24 07/26/2017 1448   BILITOT 0.4 07/26/2017 1448      All questions were answered. The patient knows to call the clinic with any problems, questions or concerns. No barriers to learning was detected.  I spent 25 minutes counseling the patient face to face. The total time spent in the appointment was 30 minutes and more than 50% was on counseling and review of test results  Heath Lark, MD 07/27/2017 8:00 AM

## 2017-07-27 NOTE — Telephone Encounter (Signed)
Notified that all Vitamin D, thyroid and general blood tests are normal. Inflammation blood test a bit high- take OTC tylenol only for arthritis.

## 2017-07-27 NOTE — Assessment & Plan Note (Addendum)
She has nonspecific sensation of dysphagia I recommend follow-up with primary care doctor or GI physician for further work-up and follow-up The patient has not lost any weight because of this. She has perceived sensation of swelling around her neck On examination, except for fatty tissue around her neck, there is no appreciable lymphadenopathy

## 2017-07-27 NOTE — Assessment & Plan Note (Signed)
She has chronic excessive fatigue The patient sleeps poorly and is undergoing tremendous stress She works every day without a break She has no time to exercise I recommend dietary modification and exercise as tolerated Repeat blood work did not indicate any signs of anemia or thyroid dysfunction to cause excessive fatigue

## 2017-07-27 NOTE — Assessment & Plan Note (Signed)
She has diffuse chronic joint pain The patient is morbidly obese Serum vitamin D level is adequate I recommend graduated exercise as tolerated and weight loss strategies Sedimentation rate is mildly elevated and could indicate chronic inflammatory arthritis She can take Tylenol as needed

## 2017-08-02 ENCOUNTER — Ambulatory Visit: Payer: BLUE CROSS/BLUE SHIELD

## 2017-08-31 DIAGNOSIS — H25819 Combined forms of age-related cataract, unspecified eye: Secondary | ICD-10-CM | POA: Diagnosis not present

## 2017-09-19 ENCOUNTER — Ambulatory Visit (INDEPENDENT_AMBULATORY_CARE_PROVIDER_SITE_OTHER): Payer: BLUE CROSS/BLUE SHIELD | Admitting: Family Medicine

## 2017-09-19 ENCOUNTER — Encounter: Payer: Self-pay | Admitting: Family Medicine

## 2017-09-19 ENCOUNTER — Ambulatory Visit: Payer: Self-pay | Admitting: General Practice

## 2017-09-19 ENCOUNTER — Telehealth: Payer: Self-pay | Admitting: Family Medicine

## 2017-09-19 VITALS — BP 122/82 | HR 61 | Temp 98.5°F | Ht 66.0 in | Wt 250.2 lb

## 2017-09-19 DIAGNOSIS — I8001 Phlebitis and thrombophlebitis of superficial vessels of right lower extremity: Secondary | ICD-10-CM | POA: Diagnosis not present

## 2017-09-19 NOTE — Telephone Encounter (Signed)
I called pt back to verify she was going to be evaluated.   She mentioned going to the [facility] ED but then decided to come see Dr. Luetta Nutting at the [facility].    I scheduled her for 4:00 today.      I called the flow coordinator at [facility] and they checked with Dr. Zigmund Daniel to make sure he was ok with seeing her since she has not been seen by a PCP since 11/19/14.    She used to see Dr. Unice Cobble at the [facility].  She is interested in establishing care with Dr. Ethelene Hal.   He is not in the office this afternoon.

## 2017-09-19 NOTE — Telephone Encounter (Signed)
She called in c/o right leg swelling, redness and tenderness to the touch on the inner calf area.   She has 3 varicose veins in that area too.    It was not there last night but was present when she woke up this morning.   She is keeping it elevated.   She wants to establish care with Dr. Ethelene Hal at the [facility] office since her doctor Unice Cobble retired.     However she needs to be seen today but Dr. Ethelene Hal is not in this afternoon.   She is willing to see Dr. Luetta Nutting but she needs to be seen today.    She needs to check on something and then she is going to call us back.   Dr. Zigmund Daniel has a 4:00 opening today.  I also offered her the [facility] as I did not want her to wait over night.   Per protocol needs to be seen within 4 hours.    She was willing to do that but was ok with seeing Dr. Zigmund Daniel instead.  She will call us back.    I let her know if her symptoms become worse to please go to the ED because if she did have a blood clot I did not want her to wait until tomorrow to be evaluated.  She verbalized understanding. Reason for Disposition . [1] Redness AND [2] painful when touched AND [3] no fever  Answer Assessment - Initial Assessment Questions 1. LOCATION: "Where is the swelling located?"  (e.g., left, right, both knees)     Right leg below the knee.  On inner side of calf is red and swollen and painful to the touch. 2. SIZE and DESCRIPTION: "What does the swelling look like?"  (e.g., entire knee, localized)     About plum sized.    3. ONSET: "When did the swelling start?" "Does it come and go, or is it there all the time?"     Not there last night but there this morning.    Every morning my ankles swell.   This morning my ankles were not swollen.   I have varicose veins.   There are 3 varicose veins in this area that is swollen and red. 4. PAIN: "Is there any pain?" If so, ask: "How bad is it?" (Scale 1-10; or mild, moderate, severe)     Tender to the touch.     I'm keeping my leg elevated.   5. SETTING: "Has there been any recent work, exercise or other activity that involved that part of the body?"      I had a family member in [facility] and I had to do a lot of walking during that time.   That's the only thing that is different. 6. AGGRAVATING FACTORS: "What makes the knee swelling worse?" (e.g., walking, climbing stairs, running)     A lot of sitting also in the hospital as well. 7. ASSOCIATED SYMPTOMS: "Is there any pain or redness?"     Yes  See above. 8. OTHER SYMPTOMS: "Do you have any other symptoms?" (e.g., chest pain, difficulty breathing, fever, calf pain)     No other symptoms. 9. PREGNANCY: "Is there any chance you are pregnant?" "When was your last menstrual period?"     Not asked  Protocols used: KNEE [facility]

## 2017-09-19 NOTE — Progress Notes (Signed)
[name] - 62 y.o. female MRN [id number]  Date of birth: May 13, 1955  Subjective Chief Complaint  Patient presents with  . Leg Swelling    right lower leg has a spot on leg that is red, tender, warm    HPI [name] is a 62 y.o. female with history of multiple varicosities/venous insufficiency and anti-cardiolipin, history of NH lymphoma and CAD here today with complaint of leg swelling.  Noticed area on R lower leg this morning that was slightly red, with mild pain and swelling.  Denies pain in calf, pain with walking, fever, chills.  She has not tried anything for treatment.  Denies prior DVT or PE.  She does take a daily 81mg  asa.  She denies numbness, tingling.   ROS:  A comprehensive ROS was completed and negative except as noted per HPI  Allergies  Allergen Reactions  . Vioxx [Rofecoxib] Swelling    Severe swelling of ankles Ankle edema  . Sulfa Antibiotics Other (See Comments)    Patient is not sure but thinks she may have an allergy to sulfa Patient is not sure but thinks she may have an allergy to sulfa Patient is not sure but thinks she may have an allergy to sulfa    Past Medical History:  Diagnosis Date  . Allergic rhinitis   . BENIGN POSITIONAL VERTIGO 03/03/2010  . Clotting disorder ([place])    a. Anti-cardiolipin antibody syndrome (IgM anticardiolipin, B2-glycoprotein IgM). b. Has had superficial thr. phebitis 06/2011,  . CMV pneumonia ([place])    CMV pneumonia 1994  . Colitis 08/2004   ?Ischemic colitis in 2006.  . Colon polyps   . Diverticulosis    Has had diverticulitis x 2  . Edema    a. LLE post ACL sprain 2010.  . Encephalitis age 37   with coma  . Endometrial cancer ([place]) 01/10/2004   a. S/P Hysterectomy and BSO  . Hemorrhoids   . HIATAL HERNIA 05/26/2009  . Normal cardiac stress test    a. Stress echo (10/11): EF 60-65%, no significant valvular abnormalities. No wall motion abnormalities with exertion, suggesting no ischemia. b. normal nuclear  stress test in 09/2013 done for pre-op   . Obesity   . Other and unspecified hyperlipidemia 06/22/2007  . Palpitations    a. Holter (10/11): 9.6% of beats were atrial ectopy, average HR 78, short runs of up to 5-6 beats of probable atrial tachycardia.   . Pulmonary nodules    a. Followed periodically by CT (Last in 03/2012).  . Reactive airway disease    a. ?RAD wheezes with respiratory infections.  . 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 Superficial thrombophlebitis   . Thyroid nodule    a. Small bilat thyroid nodule by [place] 2013, periodically followed.  Marland Kitchen Unspecified disease of the salivary glands 06/22/2007   Clogged    Past Surgical History:  Procedure Laterality Date  . CHOLECYSTECTOMY    . COLONOSCOPY W/ POLYPECTOMY  01/26/2004   Dr.Perry  . fibroidectomy    . LAPAROSCOPY     for 3 ovarian cyst   . LUNG LOBECTOMY Right   . TONSILLECTOMY    . TOTAL ABDOMINAL HYSTERECTOMY W/ BILATERAL SALPINGOOPHORECTOMY  01/2004   For endometrial cancer ; Dr Aldean Ast    Social History   Socioeconomic History  . Marital status: Married    Spouse name: Not on file  . Number of children: 0  . Years of education: Not  on file  . Highest education level: Not on file  Occupational History  . Occupation: Occupational hygienist: SELF EMPLOYED  Social Needs  . Financial resource strain: Not on file  . Food insecurity:    Worry: Not on file    Inability: Not on file  . Transportation needs:    Medical: Not on file    Non-medical: Not on file  Tobacco Use  . Smoking status: Passive Smoke Exposure - Never Smoker  . Smokeless tobacco: Never Used  . Tobacco comment: passive exposure 2nd hand x 18 years  Substance and Sexual Activity  . Alcohol use: No  . Drug use: No  . Sexual activity: Not on file  Lifestyle  . Physical activity:    Days per week: Not on file    Minutes per session: Not on file  . Stress: Not on file  Relationships  . Social  connections:    Talks on phone: Not on file    Gets together: Not on file    Attends religious service: Not on file    Active member of club or organization: Not on file    Attends meetings of clubs or organizations: Not on file    Relationship status: Not on file  Other Topics Concern  . Not on file  Social History Narrative  . Not on file    Family History  Problem Relation Age of Onset  . Asthma Father   . Diabetes Father   . Liver disease Father        Liver transplant  . Diverticulitis Father   . Colon polyps Father   . Heart attack Mother 56  . Atrial fibrillation Mother   . Deep vein thrombosis Mother   . Lung cancer Mother   . Stroke Unknown        Grandmother (? M or P)  . Heart failure Unknown        Grandmother (?M or P)  . Sudden death Paternal Grandfather   . Heart failure Paternal Grandfather   . Atrial fibrillation Unknown        Grandmother (? M or P)  . Asthma Sister   . Prostate cancer Maternal Grandfather   . Heart attack Maternal Grandmother        Questionable  . Heart failure Maternal Grandmother        Questionable  . Deep vein thrombosis Maternal Grandmother   . Colon cancer Paternal Grandmother        Questionable    Health Maintenance  Topic Date Due  . HIV Screening  07/07/1968  . TETANUS/TDAP  07/07/1972  . PAP SMEAR  04/17/2014  . MAMMOGRAM  07/19/2017  . INFLUENZA VACCINE  08/09/2017  . COLONOSCOPY  09/07/2021  . Hepatitis C Screening  Completed    ----------------------------------------------------------------------------------------------------------------------------------------------------------------------------------------------------------------- Physical Exam BP 122/82 (BP Location: Left Arm, Patient Position: Sitting, Cuff Size: Large)   Pulse 61   Temp 98.5 F (36.9 C) (Oral)   Ht 5\' 6"  (1.676 m)   Wt 250 lb 3.2 oz (113.5 kg)   SpO2 96%   BMI 40.38 kg/m   Physical Exam  Constitutional: She is oriented to  person, place, and time. She appears well-nourished. No distress.  HENT:  Head: Normocephalic and atraumatic.  Mouth/Throat: Oropharynx is clear and moist.  Neurological: She is alert and oriented to person, place, and time.  Normal gait   Skin:  Localized superficial firm area of erythema with mild  tenderness and warmth overlying varicosity in R lower extremity, halfway down medial aspect of lower leg.   Psychiatric: She has a normal mood and affect. Her behavior is normal.    ------------------------------------------------------------------------------------------------------------------------------------------------------------------------------------------------------------------- Assessment and Plan  Thrombophlebitis of superficial veins of right lower extremity Discussed that these typically do not create issues with DVT/PE She was instructed to monitor for worsening pain, increased swelling of leg Recommend warm compress Remain active, keep legs elevated at rest.  She will increase her asa to 325mg  temporarily as well.

## 2017-09-19 NOTE — Patient Instructions (Signed)
Phlebitis Phlebitis is soreness and puffiness (swelling) in a vein. Follow these instructions at home:  Only take medicine as told by your doctor.  Raise (elevate) the affected limb on a pillow as told by your doctor.  Keep a warm pack on the affected vein as told by your doctor. Do not sleep with a heating pad.  Use special stockings or bandages around the area of the affected vein as told by your doctor. These will speed healing and keep the condition from coming back.  Talk to your doctor about all the medicines you take.  Get follow-up blood tests as told by your doctor.  If the phlebitis is in your legs: ? Avoid standing or resting for long periods. ? Keep your legs moving. Raise your legs when you sit or lie.  Do not smoke.  Follow-up with your doctor as told. Contact a doctor if:  You have strange bruises or bleeding.  Your puffiness or pain in the affected area is not getting better.  You are taking medicine to lessen puffiness (anti-inflammatory medicine), and you get belly pain.  You have a fever. Get help right away if:  The phlebitis gets worse and you have more pain, puffiness (swelling), or redness.  You have trouble breathing or have chest pain. This information is not intended to replace advice given to you by your health care provider. Make sure you discuss any questions you have with your health care provider. Document Released: 12/14/2008 Document Revised: 06/03/2015 Document Reviewed: 09/02/2012 Elsevier Interactive Patient Education  2017 Elsevier Inc.  

## 2017-09-19 NOTE — Assessment & Plan Note (Signed)
Discussed that these typically do not create issues with DVT/PE She was instructed to monitor for worsening pain, increased swelling of leg Recommend warm compress Remain active, keep legs elevated at rest.  She will increase her asa to 325mg  temporarily as well.

## 2017-10-12 ENCOUNTER — Other Ambulatory Visit: Payer: Self-pay | Admitting: Physician Assistant

## 2017-10-12 DIAGNOSIS — E785 Hyperlipidemia, unspecified: Secondary | ICD-10-CM

## 2017-10-15 ENCOUNTER — Other Ambulatory Visit: Payer: Self-pay | Admitting: Physician Assistant

## 2017-10-28 ENCOUNTER — Other Ambulatory Visit: Payer: Self-pay | Admitting: Physician Assistant

## 2017-11-04 ENCOUNTER — Other Ambulatory Visit: Payer: Self-pay | Admitting: Physician Assistant

## 2017-11-04 DIAGNOSIS — E785 Hyperlipidemia, unspecified: Secondary | ICD-10-CM

## 2017-11-10 ENCOUNTER — Other Ambulatory Visit: Payer: Self-pay | Admitting: Physician Assistant

## 2017-11-12 MED ORDER — METOPROLOL TARTRATE 25 MG PO TABS: ORAL_TABLET | ORAL | 0 refills | Status: DC

## 2017-11-12 NOTE — Addendum Note (Signed)
Addended by: Juventino Slovak on: 11/12/2017 11:54 AM   Modules accepted: Orders

## 2017-11-22 DIAGNOSIS — L239 Allergic contact dermatitis, unspecified cause: Secondary | ICD-10-CM | POA: Diagnosis not present

## 2018-01-10 ENCOUNTER — Other Ambulatory Visit: Payer: Self-pay | Admitting: Family Medicine

## 2018-01-10 DIAGNOSIS — Z1231 Encounter for screening mammogram for malignant neoplasm of breast: Secondary | ICD-10-CM

## 2018-01-23 ENCOUNTER — Other Ambulatory Visit: Payer: Self-pay | Admitting: Family Medicine

## 2018-01-31 ENCOUNTER — Ambulatory Visit: Payer: BLUE CROSS/BLUE SHIELD

## 2018-02-22 ENCOUNTER — Ambulatory Visit: Payer: BLUE CROSS/BLUE SHIELD

## 2018-03-25 ENCOUNTER — Other Ambulatory Visit: Payer: Self-pay | Admitting: Physician Assistant

## 2018-03-26 ENCOUNTER — Other Ambulatory Visit: Payer: Self-pay | Admitting: Physician Assistant

## 2018-03-26 DIAGNOSIS — E785 Hyperlipidemia, unspecified: Secondary | ICD-10-CM

## 2018-08-01 ENCOUNTER — Other Ambulatory Visit: Payer: Self-pay | Admitting: Physician Assistant

## 2018-08-01 DIAGNOSIS — E785 Hyperlipidemia, unspecified: Secondary | ICD-10-CM

## 2018-08-06 ENCOUNTER — Telehealth: Payer: Self-pay | Admitting: Hematology and Oncology

## 2018-08-06 NOTE — Telephone Encounter (Signed)
Left message - called pt per 7/28 sch message to confirm cancellation.

## 2018-08-08 ENCOUNTER — Ambulatory Visit: Payer: BLUE CROSS/BLUE SHIELD | Admitting: Hematology and Oncology

## 2018-08-08 ENCOUNTER — Other Ambulatory Visit: Payer: BLUE CROSS/BLUE SHIELD

## 2018-08-15 DIAGNOSIS — R222 Localized swelling, mass and lump, trunk: Secondary | ICD-10-CM | POA: Diagnosis not present

## 2018-08-15 DIAGNOSIS — R911 Solitary pulmonary nodule: Secondary | ICD-10-CM | POA: Diagnosis not present

## 2018-08-19 DIAGNOSIS — R911 Solitary pulmonary nodule: Secondary | ICD-10-CM | POA: Diagnosis not present

## 2018-08-31 ENCOUNTER — Other Ambulatory Visit: Payer: Self-pay | Admitting: Physician Assistant

## 2018-08-31 DIAGNOSIS — E785 Hyperlipidemia, unspecified: Secondary | ICD-10-CM

## 2018-09-15 ENCOUNTER — Other Ambulatory Visit: Payer: Self-pay | Admitting: Physician Assistant

## 2018-09-15 DIAGNOSIS — E785 Hyperlipidemia, unspecified: Secondary | ICD-10-CM

## 2018-12-28 ENCOUNTER — Other Ambulatory Visit: Payer: Self-pay | Admitting: Physician Assistant

## 2018-12-28 DIAGNOSIS — E785 Hyperlipidemia, unspecified: Secondary | ICD-10-CM

## 2019-03-02 ENCOUNTER — Ambulatory Visit: Payer: BC Managed Care – PPO

## 2019-03-02 ENCOUNTER — Ambulatory Visit: Payer: BC Managed Care – PPO | Attending: Internal Medicine

## 2019-03-02 DIAGNOSIS — Z23 Encounter for immunization: Secondary | ICD-10-CM | POA: Insufficient documentation

## 2019-03-26 ENCOUNTER — Ambulatory Visit: Payer: Self-pay | Attending: Internal Medicine

## 2019-03-26 DIAGNOSIS — Z23 Encounter for immunization: Secondary | ICD-10-CM

## 2019-03-26 NOTE — Progress Notes (Signed)
   Covid-19 Vaccination Clinic  Name:  [name]    MRN: [id number] DOB: [birth year]  03/26/2019  [name] was observed post Covid-19 immunization for 15 minutes without incident. She was provided with Vaccine Information Sheet and instruction to access the V-Safe system.   [name] was instructed to call 911 with any severe reactions post vaccine: Marland Kitchen Difficulty breathing  . Swelling of face and throat  . A fast heartbeat  . A bad rash all over body  . Dizziness and weakness   Immunizations Administered    Name Date Dose VIS Date Route   Pfizer COVID-19 Vaccine 03/26/2019  8:17 AM 0.3 mL 12/20/2018 Intramuscular   Manufacturer: [employer]   Lot: [id number]   [place]: [id number]

## 2019-08-01 ENCOUNTER — Telehealth: Payer: Self-pay | Admitting: Physician Assistant

## 2019-08-01 NOTE — Telephone Encounter (Signed)
Pt has an upcoming appt in August with Estella Husk, PA, pt has not been seen since 2018. Would Estella Husk, PA, like to refill these medications until pt comes in August? Please address

## 2019-08-01 NOTE — Telephone Encounter (Signed)
*  STAT* If patient is at the pharmacy, call can be transferred to refill team.   1. Which medications need to be refilled? (please list name of each medication and dose if known) metoprolol tartrate (LOPRESSOR) 25 MG tablet rosuvastatin (CRESTOR) 10 MG tablet  2. Which pharmacy/location (including street and city if local pharmacy) is medication to be sent to? [facility] at [phone] in [address], [place] or [place], [place]  3. Do they need a 30 day or 90 day supply? 30 day

## 2019-08-04 ENCOUNTER — Other Ambulatory Visit: Payer: Self-pay

## 2019-08-04 DIAGNOSIS — E785 Hyperlipidemia, unspecified: Secondary | ICD-10-CM

## 2019-08-04 MED ORDER — ROSUVASTATIN CALCIUM 10 MG PO TABS: 10.0000 mg | ORAL_TABLET | Freq: Every day | ORAL | 0 refills | Status: DC

## 2019-08-04 MED ORDER — METOPROLOL TARTRATE 25 MG PO TABS: ORAL_TABLET | ORAL | 0 refills | Status: DC

## 2019-08-04 NOTE — Telephone Encounter (Signed)
Ok to refill meds 30 day supply until her appt with me. thanks

## 2019-08-04 NOTE — Telephone Encounter (Signed)
Filled

## 2019-08-29 NOTE — Progress Notes (Signed)
Virtual Visit via Telephone Note   This visit type was conducted due to national recommendations for restrictions regarding the COVID-19 Pandemic (e.g. social distancing) in an effort to limit this patient's exposure and mitigate transmission in our community.  Due to her co-morbid illnesses, this patient is at least at moderate risk for complications without adequate follow up.  This format is felt to be most appropriate for this patient at this time.  The patient did not have access to video technology/had technical difficulties with video requiring transitioning to audio format only (telephone).  All issues noted in this document were discussed and addressed.  No physical exam could be performed with this format.  Please refer to the patient's chart for her  consent to telehealth for [facility]    Date:  09/03/2019   ID:  [name], DOB Jan 25, 1955, MRN [id number] The patient was identified using 2 identifiers.  Patient Location: Home Provider Location: Office/Clinic  PCP:  Patient, No Pcp Per  Cardiologist:  No primary care provider on file.   Electrophysiologist:  None   Evaluation Performed:  Follow-Up Visit  Chief Complaint:  Follow  History of Present Illness:    [name] is a 64 y.o. female with with history of antiphospholipid antibody syndrome, obesity, prior endometrial cancer, and palpitations with brief runs of atrial tachycardia as well as frequent PACs on past monitoring.   3-week monitor in 12/14 (due to palpitations) showed short runs of SVT, no atrial fibrillation.  Echo showed EF 55-60% and did not have significant abnormality.  ETT-Cardiolite in 8/15 showed no ischemia or infarction.     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  This is being followed by serial CTs and is quiescent currently.     Last seen by Dr. Aundra Dubin in 02/2015. Palpitations were controlled with metoprolol, echo showed normal EF  and no significant valvular abnormalities. She had extensive coronary calcification on CT surgery recommended LDL below 70.    I saw the patient in 2018 and palpitations controlled but LDL high.   Patient was caring for her husband who died of liver failure. She has occasional palpitations that is stress related. Currently in [place] and was taking care of her sister who just died of ovarian cancer. Denies chest pain, dyspnea, dizziness or presyncope. Walking her dog 3 times a day but not sure how long-maybe 1/2 hr today. Very careful with covid19.    The patient does not have symptoms concerning for COVID-19 infection (fever, chills, cough, or new shortness of breath).    Past Medical History:  Diagnosis Date  . Allergic rhinitis   . BENIGN POSITIONAL VERTIGO 03/03/2010  . Clotting disorder ([place])    a. Anti-cardiolipin antibody syndrome (IgM anticardiolipin, B2-glycoprotein IgM). b. Has had superficial thr. phebitis 06/2011,  . 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 Colitis 08/2004   ?Ischemic colitis in 2006.  . Colon polyps   . Diverticulosis    Has had diverticulitis x 2  . Edema    a. LLE post ACL sprain 2010.  . Encephalitis age 56   with coma  . Endometrial cancer ([place]) 01/10/2004   a. S/P Hysterectomy and BSO  . Hemorrhoids   . HIATAL HERNIA 05/26/2009  . Normal cardiac stress test    a. Stress echo (10/11): EF 60-65%, no significant valvular abnormalities. No wall motion abnormalities with exertion, suggesting no ischemia.  b. normal nuclear stress test in 09/2013 done for pre-op   . Obesity   . Other and unspecified hyperlipidemia 06/22/2007  . Palpitations    a. Holter (10/11): 9.6% of beats were atrial ectopy, average HR 78, short runs of up to 5-6 beats of probable atrial tachycardia.   . Pulmonary nodules    a. Followed periodically by CT (Last in 03/2012).  . Reactive airway disease    a. ?RAD wheezes with respiratory infections.  . Skin cancer    basal cell lip  and behind ear  . Superficial thrombophlebitis   . Thyroid nodule    a. Small bilat thyroid nodule by [place] 2013, periodically followed.  Marland Kitchen Unspecified disease of the salivary glands 06/22/2007   Clogged   Past Surgical History:  Procedure Laterality Date  . CHOLECYSTECTOMY    . COLONOSCOPY W/ POLYPECTOMY  01/26/2004   Dr.Perry  . fibroidectomy    . LAPAROSCOPY     for 3 ovarian cyst   . LUNG LOBECTOMY Right   . TONSILLECTOMY    . TOTAL ABDOMINAL HYSTERECTOMY W/ BILATERAL SALPINGOOPHORECTOMY  01/2004   For endometrial cancer ; Dr Aldean Ast     Current Meds  Medication Sig  . aspirin 81 MG tablet Take 81 mg by mouth daily.    . cholecalciferol (VITAMIN D) 1000 UNITS tablet Take 1,000 Units by mouth daily. Take two by mouth once daily  . Flaxseed, Linseed, (FLAX SEED OIL) 1000 MG CAPS Take 1,000 mg by mouth daily.    . Magnesium 250 MG TABS Take 1 tablet by mouth daily as needed (cramping).   . metoprolol tartrate (LOPRESSOR) 25 MG tablet TAKE 0.5 TABLETS BY MOUTH 2 TIMES DAILY. Please make overdue appt for future refills. Thank you  . Multiple Vitamin (MULTIVITAMIN) tablet Take 1 tablet by mouth daily.    . rosuvastatin (CRESTOR) 10 MG tablet Take 1 tablet (10 mg total) by mouth daily.  . vitamin C (ASCORBIC ACID) 500 MG tablet Take 500 mg by mouth daily. As directed     Allergies:   Vioxx [rofecoxib] and Sulfa antibiotics   Social History   Tobacco Use  . Smoking status: Passive Smoke Exposure - Never Smoker  . Smokeless tobacco: Never Used  . Tobacco comment: passive exposure 2nd hand x 18 years  Substance Use Topics  . Alcohol use: No  . Drug use: No     Family Hx: The patient's family history includes Asthma in her father and sister; Atrial fibrillation in her mother and unknown relative; Colon cancer in her paternal grandmother; Colon polyps in her father; Deep vein thrombosis in her maternal grandmother and mother; Diabetes in her father; Diverticulitis in her  father; Heart attack in her maternal grandmother; Heart attack (age of onset: 85) in her mother; Heart failure in her maternal grandmother, paternal grandfather, and unknown relative; Liver disease in her father; Lung cancer in her mother; Prostate cancer in her maternal grandfather; Stroke in her unknown relative; Sudden death in her paternal grandfather.  ROS:   Please see the history of present illness.      All other systems reviewed and are negative.   Prior CV studies:   The following studies were reviewed today: CT 12/2017IMPRESSION: Postsurgical changes in the right lower lobe.   No evidence of pulmonary embolism.   Mild right basilar atelectasis/ early infiltrate. Small hilar and a subcarinal node are seen. These are likely reactive in nature. The size of the subcarinal lymph node warrant some  followup in 3-6 months.   FINDINGS: Cardiovascular: Thoracic aorta demonstrates minimal atherosclerotic calcification. Mild coronary calcifications are seen. No aneurysmal dilatation or dissection is identified. The pulmonary artery shows a normal branching pattern without intraluminal filling defect to suggest pulmonary embolism   2-D echo 2014Study Conclusions  - Left ventricle: The cavity size was normal. Systolic   function was normal. The estimated ejection fraction was   in the range of 55% to 60%. Wall motion was normal; there   were no regional wall motion abnormalities. Doppler   parameters are consistent with abnormal left ventricular   relaxation (grade 1 diastolic dysfunction). - Pulmonary arteries: Systolic pressure was within the   normal range. Nuclear stress test 2015 Overall Impression:  Normal stress nuclear study with fixed anterior defect secondary to breast attenuation.   LV Ejection Fraction: 71%.  LV Wall Motion:  NL LV Function; NL Wall Motion   NELSON, KATARINA H 09/08/2013   Normal study with no evidence for ischemia or infarction. OK for surgery.      Loralie Champagne 09/09/2013 1:29 PM    Labs/Other Tests and Data Reviewed:    EKG:  No ECG reviewed.  Recent Labs: No results found for requested labs within last 8760 hours.   Recent Lipid Panel Lab Results  Component Value Date/Time   CHOL 228 (H) 04/28/2016 04:05 PM   TRIG 169.0 (H) 04/28/2016 04:05 PM   HDL 55.80 04/28/2016 04:05 PM   CHOLHDL 4 04/28/2016 04:05 PM   LDLCALC 138 (H) 04/28/2016 04:05 PM   LDLDIRECT 152.5 08/07/2011 09:38 AM    Wt Readings from Last 3 Encounters:  09/03/19 200 lb (90.7 kg)  09/19/17 250 lb 3.2 oz (113.5 kg)  09/04/16 260 lb 6.4 oz (118.1 kg)     Objective:    Vital Signs:  BP 115/63   Pulse (!) 54   Ht 5\' 6"  (1.676 m)   Wt 200 lb (90.7 kg)   BMI 32.28 kg/m    VITAL SIGNS:  reviewed  ASSESSMENT & PLAN:   1. Palpitations: Resolved on metoprolol.  Event monitor showed short runs of SVT, no atrial fibrillation.  Echo showed normal EF and no significant valvular abnormalities.  2. Hyperlipidemia: Give extensive coronary calcification on CT, I would like to see LDL < 70. Most recent lipids in 11/16 close to goal. Was off Crestor for over a year because she couldn't get a refill. Now has been back on it for about a month. Will need FLP in 2 months but will still be in [place]. She'll call us and let us know what lab she'll be going to. 3. Obesity: I strongly encouraged diet and exercise-150 min weekly for weight loss.   4. CAD: Coronary calcification on CT.  Continue statin and aspirin.     COVID-19 Education: The signs and symptoms of COVID-19 were discussed with the patient and how to seek care for testing (follow up with PCP or arrange E-visit).  The importance of social distancing was discussed today.  Time:   Today, I have spent 14:15 minutes with the patient with telehealth technology discussing the above problems.     Medication Adjustments/Labs and Tests Ordered: Current medicines are reviewed at length with the patient  today.  Concerns regarding medicines are outlined above.   Tests Ordered: No orders of the defined types were placed in this encounter.   Medication Changes: No orders of the defined types were placed in this encounter.   Follow Up:  In Person  in 1 year(s)  Signed, Ermalinda Barrios, PA-C  09/03/2019 8:12 AM    [place]

## 2019-09-02 ENCOUNTER — Other Ambulatory Visit: Payer: Self-pay | Admitting: Physician Assistant

## 2019-09-02 DIAGNOSIS — E785 Hyperlipidemia, unspecified: Secondary | ICD-10-CM

## 2019-09-03 ENCOUNTER — Telehealth (INDEPENDENT_AMBULATORY_CARE_PROVIDER_SITE_OTHER): Payer: Medicare Other | Admitting: Physician Assistant

## 2019-09-03 ENCOUNTER — Encounter: Payer: Self-pay | Admitting: Physician Assistant

## 2019-09-03 ENCOUNTER — Other Ambulatory Visit: Payer: Self-pay

## 2019-09-03 VITALS — BP 115/63 | HR 54 | Ht 66.0 in | Wt 200.0 lb

## 2019-09-03 DIAGNOSIS — E6609 Other obesity due to excess calories: Secondary | ICD-10-CM | POA: Diagnosis not present

## 2019-09-03 DIAGNOSIS — Z6832 Body mass index (BMI) 32.0-32.9, adult: Secondary | ICD-10-CM

## 2019-09-03 DIAGNOSIS — R002 Palpitations: Secondary | ICD-10-CM

## 2019-09-03 DIAGNOSIS — I2510 Atherosclerotic heart disease of native coronary artery without angina pectoris: Secondary | ICD-10-CM

## 2019-09-03 DIAGNOSIS — E785 Hyperlipidemia, unspecified: Secondary | ICD-10-CM

## 2019-09-03 MED ORDER — METOPROLOL TARTRATE 25 MG PO TABS: ORAL_TABLET | ORAL | 3 refills | Status: AC

## 2019-09-03 MED ORDER — ROSUVASTATIN CALCIUM 10 MG PO TABS: 10.0000 mg | ORAL_TABLET | Freq: Every day | ORAL | 3 refills | Status: DC

## 2019-09-03 NOTE — Patient Instructions (Addendum)
Medication Instructions:  Your physician recommends that you continue on your current medications as directed. Please refer to the Current Medication list given to you today.  *If you need a refill on your cardiac medications before your next appointment, please call your pharmacy*   Lab Work: Call us when you find a lab near you and we can send your lab orders.  If you have labs (blood work) drawn today and your tests are completely normal, you will receive your results only by: Marland Kitchen MyChart Message (if you have MyChart) OR . A paper copy in the mail If you have any lab test that is abnormal or we need to change your treatment, we will call you to review the results.   Testing/Procedures: None   Follow-Up: At [facility], you and your health needs are our priority.  As part of our continuing mission to provide you with exceptional heart care, we have created designated Provider Care Teams.  These Care Teams include your primary Cardiologist (physician) and Advanced Practice Providers (APPs -  Physician Assistants and Nurse Practitioners) who all work together to provide you with the care you need, when you need it.  We recommend signing up for the patient portal called "MyChart".  Sign up information is provided on this After Visit Summary.  MyChart is used to connect with patients for Virtual Visits (Telemedicine).  Patients are able to view lab/test results, encounter notes, upcoming appointments, etc.  Non-urgent messages can be sent to your provider as well.   To learn more about what you can do with MyChart, go to [email or web address].    Your next appointment:   1 year(s)  The format for your next appointment:   In Person  Provider:   You may see Ena Dawley, MD or one of the following Advanced Practice Providers on your designated Care Team:    Melina Copa, PA-C  Ermalinda Barrios, PA-C    Other Instructions None

## 2019-12-17 ENCOUNTER — Telehealth: Payer: Self-pay | Admitting: Internal Medicine

## 2019-12-17 NOTE — Telephone Encounter (Signed)
Pt states she has had diverticulitis 3 times and is familiar with the symptoms. States 2 days ago she started having LLQ abd pain and it has not gotten any better. States it is a sharp pain, no fever that she knows of. Please advise.

## 2019-12-17 NOTE — Telephone Encounter (Signed)
This patient has not been seen in over 3-1/2 years.  I do not feel comfortable evaluating her symptoms over the telephone or providing empiric treatment.  See if you can work her in with one of our advanced practitioners this afternoon or tomorrow.  If not, I have a 3:40 slot Friday afternoon you may open.  Otherwise, she could be evaluated by her PCP, urgent care, or ER.  Thanks

## 2019-12-17 NOTE — Telephone Encounter (Signed)
Spoke with pt and she is aware of Dr. Blanch Media recommendations. Pt states she is in [place] right now. Discussed with pt that she could try an urgent care there or perhaps a virtual visit with mychart. Pt verbalized understanding.

## 2019-12-17 NOTE — Telephone Encounter (Signed)
Pt is requesting a call back from a nurse to discuss her diverticulitis, pt is requesting to do a virtual visit with dr Henrene Pastor or if he could prescribe something to help her symptoms.

## 2019-12-18 ENCOUNTER — Telehealth: Payer: Medicare Other | Admitting: Family Medicine

## 2019-12-18 ENCOUNTER — Telehealth: Payer: Self-pay

## 2019-12-18 NOTE — Telephone Encounter (Signed)
LVM advising patient to be seen at Urgent Care for possible diverticulitis symptoms.   Per Dr. Zigmund Daniel, pt has not been seen in years and a note was placed on the chart by GI doc advising patient to be seen at Urgent Care.   Advised patient we would be happy to schedule an appt upon her return. Callback information provided.

## 2020-09-24 ENCOUNTER — Other Ambulatory Visit: Payer: Self-pay | Admitting: Physician Assistant

## 2020-11-12 ENCOUNTER — Other Ambulatory Visit: Payer: Self-pay | Admitting: Physician Assistant

## 2020-11-12 DIAGNOSIS — E785 Hyperlipidemia, unspecified: Secondary | ICD-10-CM

## 2021-08-24 ENCOUNTER — Encounter: Payer: Self-pay | Admitting: Internal Medicine

## 2021-09-28 ENCOUNTER — Encounter: Payer: Self-pay | Admitting: Internal Medicine
